# Patient Record
Sex: Female | Born: 1935 | Race: White | Hispanic: No | Smoking: Former smoker
Health system: Southern US, Community
[De-identification: ages and names within clinical notes are randomized; demographics above are authoritative.]

## PROBLEM LIST (undated history)

## (undated) DIAGNOSIS — I639 Cerebral infarction, unspecified: Secondary | ICD-10-CM

## (undated) DIAGNOSIS — I10 Essential (primary) hypertension: Secondary | ICD-10-CM

## (undated) DIAGNOSIS — C50912 Malignant neoplasm of unspecified site of left female breast: Secondary | ICD-10-CM

## (undated) DIAGNOSIS — G43909 Migraine, unspecified, not intractable, without status migrainosus: Secondary | ICD-10-CM

## (undated) DIAGNOSIS — Z8739 Personal history of other diseases of the musculoskeletal system and connective tissue: Secondary | ICD-10-CM

## (undated) DIAGNOSIS — H409 Unspecified glaucoma: Secondary | ICD-10-CM

## (undated) DIAGNOSIS — H3560 Retinal hemorrhage, unspecified eye: Secondary | ICD-10-CM

## (undated) HISTORY — PX: TUBAL LIGATION: SHX77

## (undated) HISTORY — PX: BREAST BIOPSY: SHX20

## (undated) HISTORY — PX: AXILLARY LYMPH NODE DISSECTION: SHX5229

## (undated) HISTORY — PX: APPENDECTOMY: SHX54

---

## 1999-05-10 DIAGNOSIS — I639 Cerebral infarction, unspecified: Secondary | ICD-10-CM

## 1999-05-10 HISTORY — DX: Cerebral infarction, unspecified: I63.9

## 1999-08-29 ENCOUNTER — Encounter: Payer: Self-pay | Admitting: Emergency Medicine

## 1999-08-29 ENCOUNTER — Encounter: Payer: Self-pay | Admitting: Neurology

## 1999-08-29 ENCOUNTER — Inpatient Hospital Stay (HOSPITAL_COMMUNITY): Admission: EM | Admit: 1999-08-29 | Discharge: 1999-09-01 | Payer: Self-pay | Admitting: Emergency Medicine

## 1999-08-30 ENCOUNTER — Encounter: Payer: Self-pay | Admitting: Neurology

## 1999-09-06 ENCOUNTER — Encounter: Admission: RE | Admit: 1999-09-06 | Discharge: 1999-09-15 | Payer: Self-pay | Admitting: Neurology

## 2016-05-18 DIAGNOSIS — R05 Cough: Secondary | ICD-10-CM | POA: Diagnosis not present

## 2016-05-18 DIAGNOSIS — I10 Essential (primary) hypertension: Secondary | ICD-10-CM | POA: Diagnosis not present

## 2016-05-18 DIAGNOSIS — M109 Gout, unspecified: Secondary | ICD-10-CM | POA: Diagnosis not present

## 2016-05-18 DIAGNOSIS — Z853 Personal history of malignant neoplasm of breast: Secondary | ICD-10-CM | POA: Diagnosis not present

## 2016-05-18 DIAGNOSIS — J40 Bronchitis, not specified as acute or chronic: Secondary | ICD-10-CM | POA: Diagnosis not present

## 2016-05-18 DIAGNOSIS — Z6826 Body mass index (BMI) 26.0-26.9, adult: Secondary | ICD-10-CM | POA: Diagnosis not present

## 2016-05-18 DIAGNOSIS — E785 Hyperlipidemia, unspecified: Secondary | ICD-10-CM | POA: Diagnosis not present

## 2016-05-18 DIAGNOSIS — Z9119 Patient's noncompliance with other medical treatment and regimen: Secondary | ICD-10-CM | POA: Diagnosis not present

## 2017-03-07 DIAGNOSIS — Z23 Encounter for immunization: Secondary | ICD-10-CM | POA: Diagnosis not present

## 2017-11-01 DIAGNOSIS — I10 Essential (primary) hypertension: Secondary | ICD-10-CM | POA: Diagnosis not present

## 2017-11-01 DIAGNOSIS — E785 Hyperlipidemia, unspecified: Secondary | ICD-10-CM | POA: Diagnosis not present

## 2017-11-01 DIAGNOSIS — C50912 Malignant neoplasm of unspecified site of left female breast: Secondary | ICD-10-CM | POA: Diagnosis not present

## 2017-11-01 DIAGNOSIS — F09 Unspecified mental disorder due to known physiological condition: Secondary | ICD-10-CM | POA: Diagnosis not present

## 2017-11-01 DIAGNOSIS — Z6825 Body mass index (BMI) 25.0-25.9, adult: Secondary | ICD-10-CM | POA: Diagnosis not present

## 2017-11-01 DIAGNOSIS — Z8673 Personal history of transient ischemic attack (TIA), and cerebral infarction without residual deficits: Secondary | ICD-10-CM | POA: Diagnosis not present

## 2017-11-21 DIAGNOSIS — H2513 Age-related nuclear cataract, bilateral: Secondary | ICD-10-CM | POA: Diagnosis not present

## 2017-11-21 DIAGNOSIS — H02032 Senile entropion of right lower eyelid: Secondary | ICD-10-CM | POA: Diagnosis not present

## 2017-12-21 DIAGNOSIS — H02032 Senile entropion of right lower eyelid: Secondary | ICD-10-CM | POA: Diagnosis not present

## 2017-12-22 DIAGNOSIS — D126 Benign neoplasm of colon, unspecified: Secondary | ICD-10-CM | POA: Diagnosis not present

## 2017-12-22 DIAGNOSIS — Z6825 Body mass index (BMI) 25.0-25.9, adult: Secondary | ICD-10-CM | POA: Diagnosis not present

## 2017-12-22 DIAGNOSIS — F09 Unspecified mental disorder due to known physiological condition: Secondary | ICD-10-CM | POA: Diagnosis not present

## 2017-12-22 DIAGNOSIS — N183 Chronic kidney disease, stage 3 (moderate): Secondary | ICD-10-CM | POA: Diagnosis not present

## 2017-12-22 DIAGNOSIS — Z853 Personal history of malignant neoplasm of breast: Secondary | ICD-10-CM | POA: Diagnosis not present

## 2017-12-22 DIAGNOSIS — Z8673 Personal history of transient ischemic attack (TIA), and cerebral infarction without residual deficits: Secondary | ICD-10-CM | POA: Diagnosis not present

## 2017-12-22 DIAGNOSIS — E785 Hyperlipidemia, unspecified: Secondary | ICD-10-CM | POA: Diagnosis not present

## 2017-12-22 DIAGNOSIS — I10 Essential (primary) hypertension: Secondary | ICD-10-CM | POA: Diagnosis not present

## 2017-12-22 DIAGNOSIS — M109 Gout, unspecified: Secondary | ICD-10-CM | POA: Diagnosis not present

## 2018-05-08 DIAGNOSIS — H34832 Tributary (branch) retinal vein occlusion, left eye, with macular edema: Secondary | ICD-10-CM | POA: Diagnosis not present

## 2018-05-09 HISTORY — PX: EYE SURGERY: SHX253

## 2018-05-18 DIAGNOSIS — H35031 Hypertensive retinopathy, right eye: Secondary | ICD-10-CM | POA: Diagnosis not present

## 2018-05-18 DIAGNOSIS — H35012 Changes in retinal vascular appearance, left eye: Secondary | ICD-10-CM | POA: Diagnosis not present

## 2018-05-18 DIAGNOSIS — H35032 Hypertensive retinopathy, left eye: Secondary | ICD-10-CM | POA: Diagnosis not present

## 2018-05-21 DIAGNOSIS — R29810 Facial weakness: Secondary | ICD-10-CM | POA: Diagnosis not present

## 2018-05-21 DIAGNOSIS — G319 Degenerative disease of nervous system, unspecified: Secondary | ICD-10-CM | POA: Diagnosis not present

## 2018-05-21 DIAGNOSIS — Z8673 Personal history of transient ischemic attack (TIA), and cerebral infarction without residual deficits: Secondary | ICD-10-CM | POA: Diagnosis not present

## 2018-05-21 DIAGNOSIS — I10 Essential (primary) hypertension: Secondary | ICD-10-CM | POA: Diagnosis not present

## 2018-05-21 DIAGNOSIS — I6782 Cerebral ischemia: Secondary | ICD-10-CM | POA: Diagnosis not present

## 2018-05-21 DIAGNOSIS — Z87891 Personal history of nicotine dependence: Secondary | ICD-10-CM | POA: Diagnosis not present

## 2018-05-21 DIAGNOSIS — Z79899 Other long term (current) drug therapy: Secondary | ICD-10-CM | POA: Diagnosis not present

## 2018-05-21 DIAGNOSIS — R29818 Other symptoms and signs involving the nervous system: Secondary | ICD-10-CM | POA: Diagnosis not present

## 2018-05-21 DIAGNOSIS — G9389 Other specified disorders of brain: Secondary | ICD-10-CM | POA: Diagnosis not present

## 2018-05-21 DIAGNOSIS — Z882 Allergy status to sulfonamides status: Secondary | ICD-10-CM | POA: Diagnosis not present

## 2018-05-21 DIAGNOSIS — R410 Disorientation, unspecified: Secondary | ICD-10-CM | POA: Diagnosis not present

## 2018-05-22 ENCOUNTER — Emergency Department (HOSPITAL_COMMUNITY): Payer: Medicare Other

## 2018-05-22 ENCOUNTER — Encounter (HOSPITAL_COMMUNITY): Payer: Self-pay | Admitting: Emergency Medicine

## 2018-05-22 ENCOUNTER — Inpatient Hospital Stay (HOSPITAL_COMMUNITY)
Admission: EM | Admit: 2018-05-22 | Discharge: 2018-05-25 | DRG: 077 | Disposition: A | Discharge status: Home Health | Payer: Medicare Other | Attending: Internal Medicine | Admitting: Internal Medicine

## 2018-05-22 ENCOUNTER — Other Ambulatory Visit: Payer: Self-pay

## 2018-05-22 ENCOUNTER — Inpatient Hospital Stay (HOSPITAL_COMMUNITY): Payer: Medicare Other

## 2018-05-22 DIAGNOSIS — R4702 Dysphasia: Secondary | ICD-10-CM | POA: Diagnosis not present

## 2018-05-22 DIAGNOSIS — N179 Acute kidney failure, unspecified: Secondary | ICD-10-CM | POA: Diagnosis present

## 2018-05-22 DIAGNOSIS — R471 Dysarthria and anarthria: Secondary | ICD-10-CM | POA: Diagnosis present

## 2018-05-22 DIAGNOSIS — I161 Hypertensive emergency: Secondary | ICD-10-CM | POA: Diagnosis not present

## 2018-05-22 DIAGNOSIS — I6330 Cerebral infarction due to thrombosis of unspecified cerebral artery: Secondary | ICD-10-CM

## 2018-05-22 DIAGNOSIS — R4182 Altered mental status, unspecified: Secondary | ICD-10-CM

## 2018-05-22 DIAGNOSIS — I129 Hypertensive chronic kidney disease with stage 1 through stage 4 chronic kidney disease, or unspecified chronic kidney disease: Secondary | ICD-10-CM | POA: Diagnosis present

## 2018-05-22 DIAGNOSIS — R509 Fever, unspecified: Secondary | ICD-10-CM | POA: Diagnosis not present

## 2018-05-22 DIAGNOSIS — Z9114 Patient's other noncompliance with medication regimen: Secondary | ICD-10-CM | POA: Diagnosis not present

## 2018-05-22 DIAGNOSIS — R278 Other lack of coordination: Secondary | ICD-10-CM | POA: Diagnosis present

## 2018-05-22 DIAGNOSIS — F0390 Unspecified dementia without behavioral disturbance: Secondary | ICD-10-CM | POA: Diagnosis present

## 2018-05-22 DIAGNOSIS — I639 Cerebral infarction, unspecified: Secondary | ICD-10-CM

## 2018-05-22 DIAGNOSIS — I674 Hypertensive encephalopathy: Secondary | ICD-10-CM | POA: Diagnosis present

## 2018-05-22 DIAGNOSIS — N183 Chronic kidney disease, stage 3 (moderate): Secondary | ICD-10-CM | POA: Diagnosis not present

## 2018-05-22 DIAGNOSIS — Z881 Allergy status to other antibiotic agents status: Secondary | ICD-10-CM

## 2018-05-22 DIAGNOSIS — R441 Visual hallucinations: Secondary | ICD-10-CM | POA: Diagnosis not present

## 2018-05-22 DIAGNOSIS — G9340 Encephalopathy, unspecified: Secondary | ICD-10-CM | POA: Diagnosis present

## 2018-05-22 DIAGNOSIS — R531 Weakness: Secondary | ICD-10-CM | POA: Diagnosis not present

## 2018-05-22 DIAGNOSIS — D72829 Elevated white blood cell count, unspecified: Secondary | ICD-10-CM | POA: Diagnosis present

## 2018-05-22 DIAGNOSIS — I6521 Occlusion and stenosis of right carotid artery: Secondary | ICD-10-CM | POA: Diagnosis not present

## 2018-05-22 DIAGNOSIS — I160 Hypertensive urgency: Secondary | ICD-10-CM | POA: Diagnosis not present

## 2018-05-22 DIAGNOSIS — R4701 Aphasia: Secondary | ICD-10-CM | POA: Diagnosis present

## 2018-05-22 DIAGNOSIS — R410 Disorientation, unspecified: Secondary | ICD-10-CM | POA: Diagnosis not present

## 2018-05-22 DIAGNOSIS — Z79899 Other long term (current) drug therapy: Secondary | ICD-10-CM

## 2018-05-22 DIAGNOSIS — Z8673 Personal history of transient ischemic attack (TIA), and cerebral infarction without residual deficits: Secondary | ICD-10-CM | POA: Diagnosis not present

## 2018-05-22 DIAGNOSIS — I6389 Other cerebral infarction: Secondary | ICD-10-CM | POA: Diagnosis not present

## 2018-05-22 DIAGNOSIS — Z882 Allergy status to sulfonamides status: Secondary | ICD-10-CM

## 2018-05-22 DIAGNOSIS — G459 Transient cerebral ischemic attack, unspecified: Secondary | ICD-10-CM | POA: Diagnosis not present

## 2018-05-22 DIAGNOSIS — I499 Cardiac arrhythmia, unspecified: Secondary | ICD-10-CM | POA: Diagnosis not present

## 2018-05-22 DIAGNOSIS — H409 Unspecified glaucoma: Secondary | ICD-10-CM | POA: Diagnosis present

## 2018-05-22 DIAGNOSIS — I10 Essential (primary) hypertension: Secondary | ICD-10-CM | POA: Diagnosis present

## 2018-05-22 DIAGNOSIS — R4020 Unspecified coma: Secondary | ICD-10-CM | POA: Diagnosis not present

## 2018-05-22 DIAGNOSIS — R413 Other amnesia: Secondary | ICD-10-CM | POA: Diagnosis not present

## 2018-05-22 DIAGNOSIS — R404 Transient alteration of awareness: Secondary | ICD-10-CM | POA: Diagnosis not present

## 2018-05-22 DIAGNOSIS — D696 Thrombocytopenia, unspecified: Secondary | ICD-10-CM | POA: Diagnosis present

## 2018-05-22 DIAGNOSIS — E785 Hyperlipidemia, unspecified: Secondary | ICD-10-CM | POA: Diagnosis not present

## 2018-05-22 DIAGNOSIS — I63532 Cerebral infarction due to unspecified occlusion or stenosis of left posterior cerebral artery: Secondary | ICD-10-CM | POA: Diagnosis not present

## 2018-05-22 DIAGNOSIS — Z87891 Personal history of nicotine dependence: Secondary | ICD-10-CM

## 2018-05-22 DIAGNOSIS — Z7982 Long term (current) use of aspirin: Secondary | ICD-10-CM | POA: Diagnosis not present

## 2018-05-22 DIAGNOSIS — F1010 Alcohol abuse, uncomplicated: Secondary | ICD-10-CM | POA: Diagnosis present

## 2018-05-22 HISTORY — DX: Unspecified glaucoma: H40.9

## 2018-05-22 HISTORY — DX: Cerebral infarction, unspecified: I63.9

## 2018-05-22 HISTORY — DX: Retinal hemorrhage, unspecified eye: H35.60

## 2018-05-22 HISTORY — DX: Migraine, unspecified, not intractable, without status migrainosus: G43.909

## 2018-05-22 HISTORY — DX: Malignant neoplasm of unspecified site of left female breast: C50.912

## 2018-05-22 HISTORY — DX: Essential (primary) hypertension: I10

## 2018-05-22 HISTORY — DX: Personal history of other diseases of the musculoskeletal system and connective tissue: Z87.39

## 2018-05-22 LAB — CBC WITH DIFFERENTIAL/PLATELET
Abs Immature Granulocytes: 0.05 10*3/uL (ref 0.00–0.07)
BASOS PCT: 1 %
Basophils Absolute: 0.1 10*3/uL (ref 0.0–0.1)
Eosinophils Absolute: 0.1 10*3/uL (ref 0.0–0.5)
Eosinophils Relative: 1 %
HCT: 38.1 % (ref 36.0–46.0)
Hemoglobin: 12.4 g/dL (ref 12.0–15.0)
Immature Granulocytes: 0 %
Lymphocytes Relative: 14 %
Lymphs Abs: 1.6 10*3/uL (ref 0.7–4.0)
MCH: 31.7 pg (ref 26.0–34.0)
MCHC: 32.5 g/dL (ref 30.0–36.0)
MCV: 97.4 fL (ref 80.0–100.0)
MONOS PCT: 9 %
Monocytes Absolute: 1.1 10*3/uL — ABNORMAL HIGH (ref 0.1–1.0)
Neutro Abs: 8.9 10*3/uL — ABNORMAL HIGH (ref 1.7–7.7)
Neutrophils Relative %: 75 %
Platelets: 102 10*3/uL — ABNORMAL LOW (ref 150–400)
RBC: 3.91 MIL/uL (ref 3.87–5.11)
RDW: 12.3 % (ref 11.5–15.5)
WBC: 11.8 10*3/uL — ABNORMAL HIGH (ref 4.0–10.5)
nRBC: 0 % (ref 0.0–0.2)

## 2018-05-22 LAB — URINALYSIS, ROUTINE W REFLEX MICROSCOPIC
BILIRUBIN URINE: NEGATIVE
Glucose, UA: NEGATIVE mg/dL
KETONES UR: NEGATIVE mg/dL
Nitrite: NEGATIVE
Protein, ur: 30 mg/dL — AB
Specific Gravity, Urine: 1.012 (ref 1.005–1.030)
pH: 6 (ref 5.0–8.0)

## 2018-05-22 LAB — INFLUENZA PANEL BY PCR (TYPE A & B)
Influenza A By PCR: NEGATIVE
Influenza B By PCR: NEGATIVE

## 2018-05-22 LAB — COMPREHENSIVE METABOLIC PANEL
ALT: 29 U/L (ref 0–44)
AST: 34 U/L (ref 15–41)
Albumin: 3.8 g/dL (ref 3.5–5.0)
Alkaline Phosphatase: 57 U/L (ref 38–126)
Anion gap: 12 (ref 5–15)
BUN: 35 mg/dL — ABNORMAL HIGH (ref 8–23)
CO2: 19 mmol/L — ABNORMAL LOW (ref 22–32)
Calcium: 9.6 mg/dL (ref 8.9–10.3)
Chloride: 110 mmol/L (ref 98–111)
Creatinine, Ser: 1.54 mg/dL — ABNORMAL HIGH (ref 0.44–1.00)
GFR calc Af Amer: 35 mL/min — ABNORMAL LOW (ref >60)
GFR calc non Af Amer: 30 mL/min — ABNORMAL LOW (ref >60)
Glucose, Bld: 112 mg/dL — ABNORMAL HIGH (ref 70–99)
Potassium: 5 mmol/L (ref 3.5–5.1)
Sodium: 141 mmol/L (ref 135–145)
TOTAL PROTEIN: 7 g/dL (ref 6.5–8.1)
Total Bilirubin: 1.3 mg/dL — ABNORMAL HIGH (ref 0.3–1.2)

## 2018-05-22 LAB — HEMOGLOBIN A1C
HEMOGLOBIN A1C: 6 % — AB (ref 4.8–5.6)
MEAN PLASMA GLUCOSE: 125.5 mg/dL

## 2018-05-22 LAB — AMMONIA: AMMONIA: 22 umol/L (ref 9–35)

## 2018-05-22 LAB — LIPID PANEL
Cholesterol: 198 mg/dL (ref 0–200)
HDL: 63 mg/dL (ref >40)
LDL Cholesterol: 116 mg/dL — ABNORMAL HIGH (ref 0–99)
Total CHOL/HDL Ratio: 3.1 RATIO
Triglycerides: 96 mg/dL (ref <150)
VLDL: 19 mg/dL (ref 0–40)

## 2018-05-22 LAB — TSH: TSH: 0.627 u[IU]/mL (ref 0.350–4.500)

## 2018-05-22 LAB — I-STAT TROPONIN, ED: Troponin i, poc: 0.02 ng/mL (ref 0.00–0.08)

## 2018-05-22 LAB — I-STAT CG4 LACTIC ACID, ED: Lactic Acid, Venous: 1.58 mmol/L (ref 0.5–1.9)

## 2018-05-22 MED ORDER — SODIUM CHLORIDE 0.9 % IV BOLUS (SEPSIS)
1000.0000 mL | Freq: Once | INTRAVENOUS | Status: AC
  Administered 2018-05-22: 1000 mL via INTRAVENOUS

## 2018-05-22 MED ORDER — ACETAMINOPHEN 325 MG PO TABS: 650.0000 mg | ORAL_TABLET | Freq: Four times a day (QID) | ORAL | Status: DC | PRN

## 2018-05-22 MED ORDER — ATORVASTATIN CALCIUM 80 MG PO TABS
80.0000 mg | ORAL_TABLET | Freq: Every day | ORAL | Status: DC
  Administered 2018-05-22: 80 mg via ORAL
  Filled 2018-05-22: qty 1

## 2018-05-22 MED ORDER — LOSARTAN POTASSIUM 50 MG PO TABS
100.00 | ORAL_TABLET | ORAL | Status: DC
Start: 2018-05-22 — End: 2018-05-22

## 2018-05-22 MED ORDER — AMLODIPINE BESYLATE 5 MG PO TABS
5.0000 mg | ORAL_TABLET | Freq: Every day | ORAL | Status: DC
  Administered 2018-05-22 – 2018-05-24 (×3): 5 mg via ORAL
  Filled 2018-05-22 (×3): qty 1

## 2018-05-22 MED ORDER — ENOXAPARIN SODIUM 30 MG/0.3ML ~~LOC~~ SOLN
30.0000 mg | SUBCUTANEOUS | Status: DC
  Administered 2018-05-23 – 2018-05-24 (×2): 30 mg via SUBCUTANEOUS
  Filled 2018-05-22 (×2): qty 0.3

## 2018-05-22 MED ORDER — ACETAMINOPHEN 500 MG PO TABS
1000.0000 mg | ORAL_TABLET | Freq: Once | ORAL | Status: AC
  Administered 2018-05-22: 1000 mg via ORAL
  Filled 2018-05-22: qty 2

## 2018-05-22 MED ORDER — ENOXAPARIN SODIUM 40 MG/0.4ML ~~LOC~~ SOLN
40.0000 mg | SUBCUTANEOUS | Status: DC
  Administered 2018-05-22: 40 mg via SUBCUTANEOUS
  Filled 2018-05-22: qty 0.4

## 2018-05-22 MED ORDER — ASPIRIN EC 81 MG PO TBEC
81.0000 mg | DELAYED_RELEASE_TABLET | Freq: Every day | ORAL | Status: DC
  Administered 2018-05-22 – 2018-05-25 (×4): 81 mg via ORAL
  Filled 2018-05-22 (×4): qty 1

## 2018-05-22 MED ORDER — SENNOSIDES-DOCUSATE SODIUM 8.6-50 MG PO TABS: 1.0000 | ORAL_TABLET | Freq: Every evening | ORAL | Status: DC | PRN

## 2018-05-22 MED ORDER — HYDRALAZINE HCL 20 MG/ML IJ SOLN
10.0000 mg | Freq: Once | INTRAMUSCULAR | Status: AC
  Administered 2018-05-22: 10 mg via INTRAVENOUS
  Filled 2018-05-22: qty 1

## 2018-05-22 MED ORDER — HYDRALAZINE HCL 20 MG/ML IJ SOLN
5.0000 mg | INTRAMUSCULAR | Status: DC | PRN
  Administered 2018-05-22 – 2018-05-23 (×3): 5 mg via INTRAVENOUS
  Filled 2018-05-22 (×3): qty 1

## 2018-05-22 MED ORDER — ACETAMINOPHEN 650 MG RE SUPP: 650.0000 mg | Freq: Four times a day (QID) | RECTAL | Status: DC | PRN

## 2018-05-22 NOTE — ED Notes (Signed)
Per Neurology pt is going to be on a cardene drip, will need new bed request.

## 2018-05-22 NOTE — ED Notes (Signed)
BIB EMS from home, per family pt has been altered X1 day. Was seen at [facility] yesterday where she had stroke workup which was negative. Family wants another opinion.

## 2018-05-22 NOTE — ED Notes (Signed)
This RN attempted to collect 2nd set of blood cultures but was unsuccessful, phlebotomy aware and will attempt.

## 2018-05-22 NOTE — Consult Note (Signed)
NAME:  Rhonda Sharp, MRN:  [id number], DOB:  [birth year], LOS: 0 ADMISSION DATE:  05/22/2018, CONSULTATION DATE:  05/22/18 REFERRING MD: Dr.  Heber Westover, CHIEF COMPLAINT:  Hypertensive emergency    Brief History   This is an 85 year old female with history of hypertension who presented with a episode yesterday with and right arm weakness, came in from [facility] emergency department where showed no acute stroke patient was noted to be hypertensive up to 409 systolic.  Started on a Cardene drip with goal blood pressure of less than 180.  PCCM consulted for admission for nicardipine drip.  History of present illness   This is an 85 year old female history of hypertension, alcohol use, and recurrent symptoms of slurred speech anemia lateral weakness who had presented to [facility] yesterday for similar symptoms.  MRI there showed age-related changes with no acute ischemic events and she was discharged and recommended to follow-up with PCP.  This morning the reports that she developed symptoms of slurred speech and right arm weakness.  She was found to be hypertensive up to 811 systolic.  While in the emergency department patient symptoms had significantly improved.  Neurology was consulted who Patient recently moved here from [place] to be closer to her family.  She has not been taking her blood pressure medications for past few days.  Is not have a primary care physician in the area yet. Patient denied any headaches, blurry vision, shortness of breath, chest pain, nausea or vomiting. No other complaints at this time. She reports that her slurred speech and weakness had improved.   In the ED patient was noted to be afebrile, RR 19, heart rate around 69, and hypertensive up to 220/97.  TSH was normal.  Influenza was negative.  Urinalysis showed trace leukocytes and 30 protein.  CMP showed a creatinine of 1.4, unknown baseline.  WBC showed a mild elevation in WBC to 11.8 with  A left shift.   Lactic was not elevated at 1.58.  I-STAT troponin was 0.02.  CT head showed age related findings, no acute findings.  Chest x-ray showed no acute findings. She was given 1L NS and 10 mg hydralazine. BP was down to 914 systolic when we evaluated.   Past Medical History   Past Medical History:  Diagnosis Date  . Glaucoma   . Hypertension   . Retinal hemorrhage      Significant Hospital Events     Consults:  PCCM Neurology  Procedures:    Significant Diagnostic Tests:  CT head >> no acute findings  Micro Data:  Blood culture >>  Urine culture >>  Antimicrobials:     Interim history/subjective:  Patient denied any new symptoms since admission, she reports that she has been feeling fine and has no complaints.  Pressure has been stable, she has only received 10 mg hydralazine this morning.  Objective   Blood pressure (!) 171/84, pulse 66, temperature 100 F (37.8 C), temperature source Rectal, resp. rate 20, SpO2 98 %.        Intake/Output Summary (Last 24 hours) at 05/22/2018 1528 Last data filed at 05/22/2018 0842 Gross per 24 hour  Intake 2333.33 ml  Output -  Net 2333.33 ml   There were no vitals filed for this visit.  Examination: General: Frail appearing, no acute distress HENT: Normocephalic, atraumatic Lungs: CTA, no wheezing, rhonchi or rales Cardiovascular: Irregular rhythm, normal rate Abdomen: Soft, non-tender, +BS Extremities: Trace bilateral LE edema Neuro: 5/5 strength in upper and lower extremities,  CN 2-12 intact, sensation to light touch intact  Resolved Hospital Problem list     Assessment & Plan:   Hypertensive emergency Acute encephalopathy, possibly 2/2 hypertensive encephalopathy -Patient is noted to be on losartan 10 and bystolic 10mg  at home, she reports that she hadn't been taking anything for the past 3-4 days. On evaluation patients blood pressure was around 329 systolic, she had received hydralazine 10 mg at 8:30 this morning and  her blood pressure has remained around 518-841 systolic. Discussed with Dr. Vaughan Browner, does not think that nicardipine drip is required at this time. Recommend step down unit admission, can re-consult if blood pressure increases is not responding to PO medications.  -Start amlodipine 5 mg daily, continue hydralazine 5 mg PRN, goal blood pressure between 160-180 -Neurology following,  -Follow-up on MRI brain, MRA head and neck -Aspirin 325 daily Atorvastatin 80 mg daily -A1c and lipid panel -Telemetry -SLP -F/u TSH -PT/OT  EtOH use:  -Continue CIWA protocol  AKI: Cr on admission was 1.54, unknown baseline. No known history of kidney disease.  -BMP in AM  Best practice:  Diet: Regular Pain/Anxiety/Delirium protocol (if indicated): N/A VAP protocol (if indicated): N/A DVT prophylaxis: Lovenox GI prophylaxis: None Glucose control: per primary Mobility: Bedbound Code Status: DNR Family Communication: Spoke with patient Disposition: Step down unit  Labs   CBC: Recent Labs  Lab 05/22/18 0617  WBC 11.8*  NEUTROABS 8.9*  HGB 12.4  HCT 38.1  MCV 97.4  PLT 102*    Basic Metabolic Panel: Recent Labs  Lab 05/22/18 0617  NA 141  K 5.0  CL 110  CO2 19*  GLUCOSE 112*  BUN 35*  CREATININE 1.54*  CALCIUM 9.6   GFR: CrCl cannot be calculated (Unknown ideal weight.). Recent Labs  Lab 05/22/18 0615 05/22/18 0617  WBC  --  11.8*  LATICACIDVEN 1.58  --     Liver Function Tests: Recent Labs  Lab 05/22/18 0617  AST 34  ALT 29  ALKPHOS 57  BILITOT 1.3*  PROT 7.0  ALBUMIN 3.8   No results for input(s): LIPASE, AMYLASE in the last 168 hours. No results for input(s): AMMONIA in the last 168 hours.  ABG No results found for: PHART, PCO2ART, PO2ART, HCO3, TCO2, ACIDBASEDEF, O2SAT   Coagulation Profile: No results for input(s): INR, PROTIME in the last 168 hours.  Cardiac Enzymes: No results for input(s): CKTOTAL, CKMB, CKMBINDEX, TROPONINI in the last 168  hours.  HbA1C: Hgb A1c MFr Bld  Date/Time Value Ref Range Status  05/22/2018 03:17 AM 6.0 (H) 4.8 - 5.6 % Final    Comment:    (NOTE) Pre diabetes:          5.7%-6.4% Diabetes:              >6.4% Glycemic control for   <7.0% adults with diabetes     CBG: No results for input(s): GLUCAP in the last 168 hours.  Review of Systems:   Negative except per HPI.   Past Medical History  She,  has a past medical history of Glaucoma, Hypertension, and Retinal hemorrhage.   Surgical History   History reviewed. No pertinent surgical history.   Social History   reports that she has quit smoking. She does not have any smokeless tobacco history on file. She reports current alcohol use.   Family History   Her family history includes ALS in her sister.   Allergies Allergies  Allergen Reactions  . Sulfa Antibiotics      Home  Medications  Prior to Admission medications   Medication Sig Start Date End Date Taking? Authorizing Provider  aspirin 81 MG tablet Take 81 mg by mouth daily.   Yes [provider]  losartan (COZAAR) 100 MG tablet Take 100 mg by mouth daily.   Yes [provider]  nebivolol (BYSTOLIC) 10 MG tablet Take 10 mg by mouth daily.   Yes [provider]     Critical care time: 30 min   Asencion Noble, M.D. PGY1 Pager [phone] 05/22/2018 4:34 PM

## 2018-05-22 NOTE — H&P (Addendum)
Date: 05/22/2018               Patient Name:  Rhonda Sharp MRN: [id number]  DOB: Jul 21, 1928 Age / Sex: 85 y.o., female   PCP: Patient, No Pcp Per         Medical Service: Internal Medicine Teaching Service         Attending Physician: Dr. Lucious Groves, DO    First Contact: Dr. Koleen Distance Pager: [phone]  Second Contact: Dr. Frederico Hamman Pager: [phone]       After Hours (After 5p/  First Contact Pager: [phone]  weekends / holidays): Second Contact Pager: [phone]   Chief Complaint: non-sensible speech  History of Present Illness: Rhonda Sharp is an 85 year old female with past medical history of hypertension that presents to the [facility] ED for waxing and waning of her mental status. She is accompanied by daughter that helps with history.  Per daughter patient was doing well yesterday when suddenly she developed slurred speech that did not resolve and they transferred patient to [place] [facility] [place].  There she was evaluated for her symptoms and acute stroke was ruled out.  She was ultimately discharged from the ED at night. Per daughter this morning patient was doing well and again suddenly developed slurred speech and right arm weakness.  Patient is unable to recall this event. She again was brought in by daughter to [facility] ED for further evaluation.  Currently patient is AxO x3.  She denies any pain. She states she feels fine. She denies fever/chills, nausea/vomiting, shortness of breath, chest pain,cough, myalgias, weakness, numbness, abdominal pain, or leg swelling.  She does not know why she is in the ED.  Patient discontinued her medications 2 years ago due to personal preference. She states that the medications were for hypertension. 6 months ago she restarted two blood pressure medications but It is unclear if she is taking them.  Patient moved from [place] to live with her daughter in December 2019. She had been living alone for the past  20 years independently and decided it was time to live with someone. Her daughter and patient denies anything acutely has changed that requires patient to have additional help.  Since being in [place] daughter noticed one episode of slurred speech that resolved spontaneously. She also states that about 8 weeks ago over the phone she noticed the patient had slurred speech and dropped the phone. She states again this resolved quickly. She also reports one week ago she had seen the ophthalmologist due to worsening vision and the ophthalmologist noted a bleeding vessel in the back of her eye. This was lasered.    Meds:  Current Meds  Medication Sig  . aspirin 81 MG tablet Take 81 mg by mouth daily.  Marland Kitchen losartan (COZAAR) 100 MG tablet Take 100 mg by mouth daily.  . nebivolol (BYSTOLIC) 10 MG tablet Take 10 mg by mouth daily.     Allergies: Allergies as of 05/22/2018 - Review Complete 05/22/2018  Allergen Reaction Noted  . Sulfa antibiotics  05/22/2018   Past Medical History:  Diagnosis Date  . Hypertension     Family History: Diabetes:  Mother and Father  Social History: tobacco use: 20-pack-year smoking history. Quit in 4431V Illicit drug use denies Alcohol use: Per daughter patient drinks vodka heavily during the week. However since being in [place] for the past month has not been able to consume her usual amount due to restriction by daughter  Review of Systems: A complete ROS was negative except as per HPI.   Physical Exam: Blood pressure (!) 148/106, pulse (!) 55, temperature 100 F (37.8 C), temperature source Rectal, resp. rate 20, SpO2 96 %. Physical Exam  Constitutional: She is oriented to person, place, and time and well-developed, well-nourished, and in no distress.  Cardiovascular: Normal rate, regular rhythm and normal heart sounds. Exam reveals no gallop and no friction rub.  No murmur heard. Pulmonary/Chest: Effort normal and breath sounds normal. No respiratory  distress. She has no wheezes. She has no rales.  Abdominal: Soft. Bowel sounds are normal. She exhibits no distension and no mass. There is no abdominal tenderness. There is no rebound and no guarding.  Neurological: She is alert and oriented to person, place, and time. No cranial nerve deficit.  5/5 motor strength in upper extremities bilaterally 4/5 motor strength in right lower extremity 5/5 motor strength in left lower extremity  Skin: Skin is warm and dry. No erythema.    EKG: personally reviewed my interpretation is ectopic atrial rhythm, no ischemic changes  CXR: personally reviewed my interpretation is atelectatic changes at the bases. No pulmonary edema or pleural effusions noted  Assessment & Plan by Problem: Active Problems:   Altered mental status  Altered mental status On admission patient had returned to baseline mental status per daughter.  She was alert and able to converse with a clear thought process.  Her blood pressure on admission was 220/97 And was 179/80 when being interviewed.  Patient had a CT head and MR brain yesterday when she had symptoms that did not show any acute explanation.  Today patient had a repeat CT head with no acute intracranial findings.  With the lowering of blood pressure and improvement in symptoms hypertensive encephalopathy is on the differentila.  At this time goal blood pressure will be to reduce initial blood pressure to <180/100.  Patient received hydralazine in the ED and blood pressure is currently 148/106.  Will need to monitor closely and will currently avoid extra antihypertensives at this time.  Due to symptoms of slurred speech and right upper extremity weakness patient may be experiencing TIAs. ABCD score placed patient in high risk for stroke.  Will consult neurology  Patient does have a history of heavy alcohol use and has reduced intake significantly since moving in with her daughter. Patient does not appear to be in withdrawal but  will place on CIWA. Patient has discontinued her recommended medications 2 years ago due to personal preference. It is unclear what patient was taking prior. Will obtain TSH Patient does have a mild leukocytosis with an elevated rectal temperature of 100. Chest x-ray and urine without signs of infection. Currently patient afebrile. Blood cultures obtained in the ED. At this time no signs to start antibiotics. -Blood pressure goal , 180/90 - consult neurology  -aspirin -CIWA - TSH -follow up blood cultures    Hypertension -Holding oral home blood pressure medications. These will likely need to be restarted.   Dispo: Admit patient to Inpatient with expected length of stay greater than 2 midnights.  Signed: Valinda Party, DO 05/22/2018, 9:17 AM  Pager: [phone]

## 2018-05-22 NOTE — Progress Notes (Addendum)
Discussed case with Dr. Cheral Marker with neurology who recommends nicardipine drip for blood pressure control. Discussed transfer to ICU with Dr. Vaughan Browner for management who accepts patient.

## 2018-05-22 NOTE — Consult Note (Addendum)
Neurology Consultation  Reason for Consult: Transient intermittent expressive aphasia/right arm weakness/hypertension Referring Physician: Dr. Heber Lovingston  History is obtained from: Daughter  HPI: Rhonda Sharp is a 85 y.o. female hypertension and possibly neurocognitive decline.  Per daughter patient was noted to have onset of expressive aphasia along with some right arm weakness which lasted for about 20 minutes. The aphasia then dissipated, but the right arm weakness did not resolve.  Patient was brought to [facility] emergency department where they did obtain MRI which did not show any acute stroke.  While at [facility] ED it was noted that she did have hypertension at 174/77.  Overnight daughter noted that the symptoms still waxed and waned, especially her expressive aphasia.  This morning it appeared that her expressive aphasia worsened; thus the daughter became worried and brought her mother to [facility], ED.  While here, she has had fluctuating blood pressures but more on the hypertensive urgency side ranging from 220/97 to 142/55 with last blood pressure being 187/93.  Daughter did make comment that prior to coming to [place] this past December to have further care with family she did stop taking her medications as her other daughter was diagnosed with ALS and she became depressed.  Prior to coming here, she did live in a two-level house, did not drive and has never driven, took care of herself by cooking, cleaning, doing laundry and all ADLs needed. Per daughter that there are some things that are making her slightly worried that her mom is getting some neurocognitive decline.  She did note that in her old house the neighbor found out that she had called the cops as she thought somebody was in her house and became paranoid when nobody was actually there.  ROS:  Unable to obtain due to altered mental status.   Past Medical History:  Diagnosis Date  . Glaucoma   . Hypertension   . Retinal  hemorrhage      Family History  Problem Relation Age of Onset  . ALS Sister    Social History:   reports that she has quit smoking. She does not have any smokeless tobacco history on file. She reports current alcohol use. No history on file for drug.  Medications  Current Facility-Administered Medications:  .  acetaminophen (TYLENOL) tablet 650 mg, 650 mg, Oral, Q6H PRN **OR** acetaminophen (TYLENOL) suppository 650 mg, 650 mg, Rectal, Q6H PRN, Hoffman, Jessica Ratliff, DO .  aspirin EC tablet 81 mg, 81 mg, Oral, Daily, Hoffman, Jessica Ratliff, DO .  enoxaparin (LOVENOX) injection 40 mg, 40 mg, Subcutaneous, Q24H, Hoffman, Jessica Ratliff, DO .  senna-docusate (Senokot-S) tablet 1 tablet, 1 tablet, Oral, QHS PRN, Valinda Party, DO  Current Outpatient Medications:  .  aspirin 81 MG tablet, Take 81 mg by mouth daily., Disp: , Rfl:  .  losartan (COZAAR) 100 MG tablet, Take 100 mg by mouth daily., Disp: , Rfl:  .  nebivolol (BYSTOLIC) 10 MG tablet, Take 10 mg by mouth daily., Disp: , Rfl:    Exam: Current vital signs: BP (!) 171/84   Pulse 66   Temp 100 F (37.8 C) (Rectal)   Resp 20   SpO2 98%  Vital signs in last 24 hours: Temp:  [100 F (37.8 C)] 100 F (37.8 C) (01/14 0601) Pulse Rate:  [55-70] 66 (01/14 1430) Resp:  [12-21] 20 (01/14 1430) BP: (117-220)/(48-106) 171/84 (01/14 1430) SpO2:  [96 %-98 %] 98 % (01/14 1430)  Physical Exam  Constitutional: Appears well-developed and  well-nourished.  Eyes: No scleral injection HENT: No OP obstrucion Head: Normocephalic.  Cardiovascular: Normal rate and regular rhythm.  Respiratory: Effort normal, non-labored breathing GI: Soft.  No distension. There is no tenderness.  Skin: WDI  Neuro: Mental Status: Patient is awake, alert to state that she is in a hospital but does not know which hospital she is in.  Oriented to [facility] and [place].  Patient is unable to give me a clear and coherent history.  She  states that she felt dizzy and that is why she is at the hospital, which is not the correct reason. Speech is fluent. Patient is able to follow simple motor commands, name objects and repeat but cannot follow a directional 3-step command, becoming rapidly confused.  Cranial Nerves: II: Visual fields are full.  III,IV, VI: EOMI without ptosis or diplopia. Pupils are equal, round, and reactive to light.   V: Facial sensation is symmetric to temperature VII: Facial movement is symmetric.  VIII: hearing is intact to voice X: Uvula elevates symmetrically XI: Shoulder shrug is symmetric. XII: tongue is midline without atrophy or fasciculations.  Motor: 4/5 throughout. No asymmetry.  Sensory: Sensation is symmetric to light touch and temperature in the arms and legs. Deep Tendon Reflexes: 2+ and symmetric in the biceps and patellae.  Plantars: Toes are downgoing bilaterally.  Cerebellar: FNF within normal limits bilaterally.  She did have dysmetria with right H-S but left H-S was normal    Labs I have reviewed labs in epic and the results pertinent to this consultation are: Urinalysis pending  CBC    Component Value Date/Time   WBC 11.8 (H) 05/22/2018 0617   RBC 3.91 05/22/2018 0617   HGB 12.4 05/22/2018 0617   HCT 38.1 05/22/2018 0617   PLT 102 (L) 05/22/2018 0617   MCV 97.4 05/22/2018 0617   MCH 31.7 05/22/2018 0617   MCHC 32.5 05/22/2018 0617   RDW 12.3 05/22/2018 0617   LYMPHSABS 1.6 05/22/2018 0617   MONOABS 1.1 (H) 05/22/2018 0617   EOSABS 0.1 05/22/2018 0617   BASOSABS 0.1 05/22/2018 0617    CMP     Component Value Date/Time   NA 141 05/22/2018 0617   K 5.0 05/22/2018 0617   CL 110 05/22/2018 0617   CO2 19 (L) 05/22/2018 0617   GLUCOSE 112 (H) 05/22/2018 0617   BUN 35 (H) 05/22/2018 0617   CREATININE 1.54 (H) 05/22/2018 0617   CALCIUM 9.6 05/22/2018 0617   PROT 7.0 05/22/2018 0617   ALBUMIN 3.8 05/22/2018 0617   AST 34 05/22/2018 0617   ALT 29 05/22/2018 0617    ALKPHOS 57 05/22/2018 0617   BILITOT 1.3 (H) 05/22/2018 0617   GFRNONAA 30 (L) 05/22/2018 0617   GFRAA 35 (L) 05/22/2018 0617    Lipid Panel  No results found for: CHOL, TRIG, HDL, CHOLHDL, VLDL, LDLCALC, LDLDIRECT   Imaging I have reviewed the images obtained:  CT-scan of the brain-Age congruent senescent changes without acute finding.  MRI examination of the brain-pending  Etta Quill PA-C [facility] [phone] 05/22/2018, 3:15 PM     Assessment:  85 year old female with possible neurocognitive decline re-presenting to the hospital secondary to intermittent expressive aphasia  1. MRI from her visit to [place] yesterday was negative for acute stroke per Care Everywhere  2. Due to worsening today the patient was brought back to the hospital.   3. Exam does not reveal a classic receptive or expressive aphasia. TIAs are possible but felt to be unlikely. Her  speech is fluent with intact comprehension for simple questions and commands. Most likely she has an undiagnosed underlying dementia, with acute worsening secondary to hypertensive encephalopathy.   Recommendations: # Nicardipine drip for blood pressure-this been has discussed with primary team- goal systolic blood pressure is < 180   # Repeat MRI of the brain without contrast # MRA Head  # Carotid ultrasound  # TTE   # Start patient on ASA 325 mg daily,  # Given advanced age, benefits of statin most likely outweighed by risks # SBP goal of < 180. This differs from standard permissive HTN protocol for possible stroke as her presentation is felt to be more likely due to hypertensive encephalopathy # Telemetry monitoring # Frequent neuro checks # NPO until passes stroke swallow screen # please page stroke NP  Or  PA  Or MD from 8am -4 pm  as this patient from this time will be  followed by the stroke.   You can look them up on [email or web address]  Password [email or web address]  I have interviewed and examined the patient. I have  formulated the assessment and plan.  Electronically signed: Dr. Kerney Elbe

## 2018-05-22 NOTE — ED Notes (Signed)
Report called, neurology team at bedside and requesting this RN wait until attending assesses pt before transporting upstairs.

## 2018-05-22 NOTE — ED Notes (Signed)
Patient transported to CT 

## 2018-05-22 NOTE — ED Provider Notes (Signed)
TIME SEEN: 6:08 AM  CHIEF COMPLAINT: Altered mental status  HPI: Patient is an 85 year old female with history of hypertension who presents to the emergency department with EMS with altered mental status.  History is provided by EMS.  Per family, patient has been altered since yesterday.  Was seen at [place] and had labs, urine, head CT, MRI done which were reportedly unremarkable.  Was discharged home with diagnosis of delirium.  Family sent her here for "second opinion".  ROS: Level 5 caveat for altered mental status  PAST MEDICAL HISTORY/PAST SURGICAL HISTORY:  Past Medical History:  Diagnosis Date  . Hypertension     MEDICATIONS:  Prior to Admission medications   Not on File    ALLERGIES:  Allergies  Allergen Reactions  . Sulfa Antibiotics     SOCIAL HISTORY:  Social History   Tobacco Use  . Smoking status: Not on file  Substance Use Topics  . Alcohol use: Not on file    FAMILY HISTORY: No family history on file.  EXAM: BP (!) 220/97 (BP Location: Right Arm)   Pulse 68   Temp 100 F (37.8 C) (Rectal)   Resp 12   SpO2 97%  CONSTITUTIONAL: Elderly.  Patient answers "no" to every question.  In no distress.  Rectal temp 100.  Patient is hypertensive. HEAD: Normocephalic, atraumatic EYES: Conjunctivae clear, pupils appear equal, EOMI ENT: normal nose; dry mucous membranes NECK: Supple, no meningismus, no nuchal rigidity, no LAD  CARD: RRR; S1 and S2 appreciated; no murmurs, no clicks, no rubs, no gallops RESP: Normal chest excursion without splinting or tachypnea; breath sounds clear and equal bilaterally; no wheezes, no rhonchi, no rales, no hypoxia or respiratory distress, speaking full sentences ABD/GI: Normal bowel sounds; non-distended; soft, non-tender, no rebound, no guarding, no peritoneal signs, no hepatosplenomegaly BACK:  The back appears normal and is non-tender to palpation, there is no CVA tenderness EXT: Normal ROM in all joints; non-tender to  palpation; no edema; normal capillary refill; no cyanosis, no calf tenderness or swelling    SKIN: Normal color for age and race; warm; no rash NEURO: Moves all extremities equally, follows commands, no dysarthria noted, patient answers no to every question, patient does not oriented, unable to test sensation appropriately, cranial nerves II through XII intact   MEDICAL DECISION MAKING: Patient here with altered mental status.  Found to be hypertensive and febrile.  Otherwise vital signs within normal limits.  No signs of trauma on examination.  She seems to follow commands but does not answer questions appropriately.  Unclear when last seen normal.  No family at bedside currently.  Will obtain labs, cultures, urine, chest x-ray, flu swab.  Will give IV fluids, Tylenol.  Will attempt to obtain records from [place] given she just had a head CT and MRI of her brain yesterday.  ED PROGRESS: Patient's daughter at bedside.  She reports that patient was living on her own in [place] until December.  She reports patient recently moved in with her but is still very independent and can carry on a conversation and care for herself.  2 days ago she became altered, talking "gibberish".  Was seen at [facility] ED in [place] and had a work-up which they report was unremarkable and discharged home.  She felt patient was not safe to be at home given her level of confusion.  Patient able to talk more now but has slightly dysarthric speech which is likely from dry mucous membranes.  She is hypertensive  here.  Patient cries inappropriately.  Patient denies any pain at this time.  When asked her what her name is she states "nobody told me what it was".   Patient's work-up here has been relatively unremarkable.  She does have mildly elevated creatinine.  She is receiving IV fluids.  No baseline for comparison.  Mild leukocytosis with left shift.  Lactate normal.  Chest x-ray clear.  Urine shows 11-20 white blood cells and rare  bacteria.  Urine culture, blood cultures pending.  Flu swab has been sent.  Patient continues to be hypertensive.  Will give IV hydralazine as hypertensive encephalopathy is also on the differential.  We have not yet received records from [place].  Will repeat head CT here but daughter reports head CT was recently normal.  Will admit for altered mental status.  She does not have a local primary care physician.  She denies any headache.  Has no meningismus on exam.   No changes in medications.  Patient has no drug or alcohol abuse history.  No known history of any trauma per family.    We are attempting to get records from [facility] emergency department.   7:52 AM Discussed patient's case with IM resident.  I have recommended admission and patient (and family if present) agree with this plan. Admitting physician will place admission orders.   I reviewed all nursing notes, vitals, pertinent previous records, EKGs, lab and urine results, imaging (as available).   I have reviewed patient's head CT and do not see any acute abnormality.   EKG Interpretation  Date/Time:  Tuesday May 22 2018 06:03:27 EST Ventricular Rate:  66 PR Interval:    QRS Duration: 80 QT Interval:  468 QTC Calculation: 491 R Axis:   61 Text Interpretation:  Sinus or ectopic atrial rhythm Probable LVH with secondary repol abnrm Borderline prolonged QT interval No significant change since last tracing Confirmed by Pryor Curia [phone]) on 05/22/2018 6:46:15 AM        CRITICAL CARE Performed by: Cyril Mourning Ward   Total critical care time: 55 minutes  Critical care time was exclusive of separately billable procedures and treating other patients.  Critical care was necessary to treat or prevent imminent or life-threatening deterioration.  Critical care was time spent personally by me on the following activities: development of treatment plan with patient and/or surrogate as well as nursing, discussions with  consultants, evaluation of patient's response to treatment, examination of patient, obtaining history from patient or surrogate, ordering and performing treatments and interventions, ordering and review of laboratory studies, ordering and review of radiographic studies, pulse oximetry and re-evaluation of patient's condition.    Ward, Delice Bison, DO 05/22/18 [id number]

## 2018-05-22 NOTE — ED Notes (Signed)
Pt given turkey sandwich and cranberry juice. 

## 2018-05-23 ENCOUNTER — Inpatient Hospital Stay (HOSPITAL_COMMUNITY): Payer: Medicare Other

## 2018-05-23 DIAGNOSIS — E785 Hyperlipidemia, unspecified: Secondary | ICD-10-CM

## 2018-05-23 DIAGNOSIS — I6330 Cerebral infarction due to thrombosis of unspecified cerebral artery: Secondary | ICD-10-CM

## 2018-05-23 DIAGNOSIS — I639 Cerebral infarction, unspecified: Secondary | ICD-10-CM

## 2018-05-23 LAB — BASIC METABOLIC PANEL
Anion gap: 11 (ref 5–15)
BUN: 32 mg/dL — ABNORMAL HIGH (ref 8–23)
CO2: 20 mmol/L — ABNORMAL LOW (ref 22–32)
Calcium: 9.1 mg/dL (ref 8.9–10.3)
Chloride: 110 mmol/L (ref 98–111)
Creatinine, Ser: 1.43 mg/dL — ABNORMAL HIGH (ref 0.44–1.00)
GFR calc Af Amer: 38 mL/min — ABNORMAL LOW (ref >60)
GFR calc non Af Amer: 33 mL/min — ABNORMAL LOW (ref >60)
Glucose, Bld: 114 mg/dL — ABNORMAL HIGH (ref 70–99)
Potassium: 4.2 mmol/L (ref 3.5–5.1)
Sodium: 141 mmol/L (ref 135–145)

## 2018-05-23 LAB — CBC
HCT: 36.4 % (ref 36.0–46.0)
Hemoglobin: 12 g/dL (ref 12.0–15.0)
MCH: 31.8 pg (ref 26.0–34.0)
MCHC: 33 g/dL (ref 30.0–36.0)
MCV: 96.6 fL (ref 80.0–100.0)
Platelets: 122 10*3/uL — ABNORMAL LOW (ref 150–400)
RBC: 3.77 MIL/uL — ABNORMAL LOW (ref 3.87–5.11)
RDW: 12.5 % (ref 11.5–15.5)
WBC: 16.7 10*3/uL — ABNORMAL HIGH (ref 4.0–10.5)
nRBC: 0 % (ref 0.0–0.2)

## 2018-05-23 LAB — URINE CULTURE: Culture: NO GROWTH

## 2018-05-23 MED ORDER — LABETALOL HCL 5 MG/ML IV SOLN
5.0000 mg | INTRAVENOUS | Status: DC | PRN
  Administered 2018-05-24 (×2): 5 mg via INTRAVENOUS
  Filled 2018-05-23 (×2): qty 4

## 2018-05-23 MED ORDER — NEBIVOLOL HCL 5 MG PO TABS
10.0000 mg | ORAL_TABLET | Freq: Every day | ORAL | Status: DC
  Administered 2018-05-23 – 2018-05-25 (×3): 10 mg via ORAL
  Filled 2018-05-23 (×3): qty 2

## 2018-05-23 MED ORDER — PRAVASTATIN SODIUM 10 MG PO TABS
20.0000 mg | ORAL_TABLET | Freq: Every day | ORAL | Status: DC
  Administered 2018-05-23 – 2018-05-24 (×2): 20 mg via ORAL
  Filled 2018-05-23 (×2): qty 2

## 2018-05-23 NOTE — Clinical Social Work Note (Signed)
Clinical Social Work Assessment  Patient Details  Name: Rhonda Sharp MRN: [id number] Date of Birth: February 25, 1929  Date of referral:  05/23/18               Reason for consult:  Facility Placement, Discharge Planning                Permission sought to share information with:  Family Supports Permission granted to share information::  No(patient not oriented)  Name::     Claiborne Billings  Agency::     Relationship::  Daughter     Contact Information:  [phone]   Housing/Transportation Living arrangements for the past 2 months:  Single Family Home Source of Information:  Adult Children Patient Interpreter Needed:  None Criminal Activity/Legal Involvement Pertinent to Current Situation/Hospitalization:  No - Comment as needed Significant Relationships:  Adult Children, Other Family Members Lives with:  Adult Children Do you feel safe going back to the place where you live?  Yes Need for family participation in patient care:  Yes (Comment)  Care giving concerns: Patient from home. PT recommending SNF vs home with 24 hour supervision/care.   Social Worker assessment / plan: CSW spoke to patient's daughter [name] on the phone. Introduced self and role and discussed disposition planning - PT recommendation for SNF.   [name] reported that she does not work and that her own daughter is also home during the day and can assist with caring for patient. They would prefer to bring patient home and provide 24 hour care for her there. [name] concerned that if patient goes to a SNF, her mental status will continue to decline; stated that she hasn't seen her mother "like this" before and is hopeful taking patient home will help her return to baseline. [name] is open to home health care.  Referred to [facility] for home health care needs. CSW will sign off for now, but please re-consult if disposition plan changes.  Employment status:  Retired Forensic scientist:  [employer] PT Recommendations:   [place] / Referral to community resources:  [place]  Patient/Family's Response to care: Daughter appreciative of care.  Patient/Family's Understanding of and Emotional Response to Diagnosis, Current Treatment, and Prognosis: Daughter with good understanding of patient's condition and care needs. Daughter prefers for patient to return home.   Emotional Assessment Appearance:  Appears stated age Attitude/Demeanor/Rapport:  Unable to Assess Affect (typically observed):  Unable to Assess Orientation:  Oriented to Self, Oriented to Place Alcohol / Substance use:  Not Applicable Psych involvement (Current and /or in the community):  No (Comment)  Discharge Needs  Concerns to be addressed:  Discharge Planning Concerns Readmission within the last 30 days:  No Current discharge risk:  Physical Impairment, Cognitively Impaired Barriers to Discharge:  Continued Medical Work up   Estanislado Emms, LCSW 05/23/2018, 12:17 PM

## 2018-05-23 NOTE — Progress Notes (Signed)
Physical Therapy Treatment Patient Details Name: Rhonda Sharp MRN: [id number] DOB: Aug 25, 1928 Today's Date: 05/23/2018    History of Present Illness Pt is an 85 y.o. female admitted 05/22/17 with AMS and hypertensive urgency. CXR without acute findings. Head CT negative for acute findings. Per neurology, pt likely has an undiagnosed underlying dementia, with acute worsening secondary to hypertensive encephalopathy. PMH includes HTN, glaucoma.   PT Comments    Pt seen, was standing in the room with chair alarm going off and heart monitor cords pulling. Pt required max encouragement to return to sitting (refused bed). Pt confused regarding current situation and PT/RN's request that she remain seated; increased time spent explaining this, but pt still confused. Spoke with daughter [name]) on phone to explain situation and also discuss pt's baseline PLOF/cognition. Daughter reports pt's cognition WFL and indep with mobility at home. Daughter unsure if pt would be agreeable to SNF; daughter available for 24/7 support at home. Pt at high risk for falls due to instability and decreased awareness. RN to provide activity board.   Follow Up Recommendations  SNF;Home health PT;Supervision/Assistance - 24 hour(daughter is able to provide 24/7)     Equipment Recommendations  Rolling walker with 5" wheels    Recommendations for Other Services       Precautions / Restrictions Precautions Precautions: Fall Restrictions Weight Bearing Restrictions: No    Mobility  Bed Mobility Overal bed mobility: Modified Independent             General bed mobility comments: Received standing in room with chair alarm beeping  Transfers Overall transfer level: Needs assistance Equipment used: None Transfers: Sit to/from Stand Sit to Stand: Min guard         General transfer comment: Reliant on UE support to steady upon standing  Ambulation/Gait Ambulation/Gait assistance: Min guard Gait  Distance (Feet): 10 Feet Assistive device: None Gait Pattern/deviations: Step-to pattern;Narrow base of support;Trunk flexed Gait velocity: Decreased Gait velocity interpretation: <1.8 ft/sec, indicate of risk for recurrent falls General Gait Details: Pt reaching for UE support on furniture to maintain balance; min guard for safety and assist to untangle lines. Max encouragement to return to sitting versus staying standing in middle of room   Stairs             Wheelchair Mobility    Modified Rankin (Stroke Patients Only) Modified Rankin (Stroke Patients Only) Pre-Morbid Rankin Score: No symptoms Modified Rankin: Moderately severe disability     Balance Overall balance assessment: Needs assistance   Sitting balance-Leahy Scale: Good       Standing balance-Leahy Scale: Fair Standing balance comment: Can static stand and take steps without UE support; min guard as pt unstable                            Cognition Arousal/Alertness: Awake/alert Behavior During Therapy: Flat affect Overall Cognitive Status: Impaired/Different from baseline Area of Impairment: Orientation;Attention;Following commands;Safety/judgement;Awareness;Problem solving                 Orientation Level: Disoriented to;Situation Current Attention Level: Selective   Following Commands: Follows one step commands with increased time;Follows one step commands inconsistently Safety/Judgement: Decreased awareness of safety;Decreased awareness of deficits Awareness: Emergent Problem Solving: Slow processing;Decreased initiation;Requires verbal cues General Comments: Spoke with daughter on phone who reports no significant baseline cognitive deficits ("she acts like a normal 85 year old"), as pt is indep at home. Pt standing in room with chair alarm beeping  and heart monitor pulled to end of cord on pt; refusing return to bed, would only sit in recliner, but reports she will keep getting up.  Extended time spent explaining fall risk and reason it is unsafe for pt to get up alone with cords attached to her; pt does not seem to fully grasp this as she should be able to do what she wants to do      Exercises      General Comments General comments (skin integrity, edema, etc.): Spoke with daughter [name]) on phone regarding pt's current confusion; [name] to come into hospital to provide support; she reports pt indep and baseline cognition WFL at home. Daughter reports she would be able to provide 24/7 support at home      Pertinent Vitals/Pain Pain Assessment: No/denies pain    Home Living Family/patient expects to be discharged to:: Private residence Living Arrangements: Children;Other relatives Available Help at Discharge: Family;Available 24 hours/day Type of Home: House       Home Equipment: Kasandra Knudsen - single point Additional Comments: Pt recently moved from [place] to live with daughter. Daughter reports she is available for 24/7 assist    Prior Function Level of Independence: Independent with assistive device(s)      Comments: Pt reports indep with intermittent use of cane. Per daughter, pt always carries her cane, indep with ADLs, able to help with household tasks   PT Goals (current goals can now be found in the care plan section) Acute Rehab PT Goals Patient Stated Goal: Daughter unsure about SNF PT Goal Formulation: With patient/family Time For Goal Achievement: 06/06/18 Progress towards PT goals: Progressing toward goals    Frequency    Min 3X/week      PT Plan Current plan remains appropriate    Co-evaluation              AM-PAC PT "6 Clicks" Mobility   Outcome Measure  Help needed turning from your back to your side while in a flat bed without using bedrails?: None Help needed moving from lying on your back to sitting on the side of a flat bed without using bedrails?: None Help needed moving to and from a bed to a chair (including a wheelchair)?: A  Little Help needed standing up from a chair using your arms (e.g., wheelchair or bedside chair)?: A Little Help needed to walk in hospital room?: A Little Help needed climbing 3-5 steps with a railing? : A Little 6 Click Score: 20    End of Session Equipment Utilized During Treatment: Gait belt Activity Tolerance: Other (comment)(Patient limited by confusion) Patient left: in chair;with call bell/phone within reach;with chair alarm set Nurse Communication: Mobility status PT Visit Diagnosis: Other abnormalities of gait and mobility (R26.89);Unsteadiness on feet (R26.81)     Time: 7017-7939 PT Time Calculation (min) (ACUTE ONLY): 16 min  Charges:  $Gait Training: 8-22 mins $Self Care/Home Management: [place], PT, DPT Acute Rehabilitation Services  Pager [phone] Office [phone]  Derry Lory 05/23/2018, 9:55 AM

## 2018-05-23 NOTE — Progress Notes (Addendum)
STROKE TEAM PROGRESS NOTE   INTERVAL HISTORY No family at bedside. Patient awake, alert and oriented x 4. Sitting up in chair. discused POC: EEG: pending.  Vitals:   05/23/18 0124 05/23/18 0200 05/23/18 0615 05/23/18 0651  BP: (!) 170/82  (!) 181/83 (!) 176/85  Pulse:      Resp: (!) 21   20  Temp:      TempSrc:      SpO2:      Weight:  63.4 kg    Height:        CBC:  Recent Labs  Lab 05/22/18 0617 05/23/18 0404  WBC 11.8* 16.7*  NEUTROABS 8.9*  --   HGB 12.4 12.0  HCT 38.1 36.4  MCV 97.4 96.6  PLT 102* 122*    Basic Metabolic Panel:  Recent Labs  Lab 05/22/18 0617 05/23/18 0404  NA 141 141  K 5.0 4.2  CL 110 110  CO2 19* 20*  GLUCOSE 112* 114*  BUN 35* 32*  CREATININE 1.54* 1.43*  CALCIUM 9.6 9.1   Lipid Panel:     Component Value Date/Time   CHOL 198 05/22/2018 0617   TRIG 96 05/22/2018 0617   HDL 63 05/22/2018 0617   CHOLHDL 3.1 05/22/2018 0617   VLDL 19 05/22/2018 0617   LDLCALC 116 (H) 05/22/2018 0617   HgbA1c:  Lab Results  Component Value Date   HGBA1C 6.0 (H) 05/22/2018   Urine Drug Screen: No results found for: LABOPIA, COCAINSCRNUR, LABBENZ, AMPHETMU, THCU, LABBARB  Alcohol Level No results found for: [facility]  IMAGING Dg Chest 2 View  Result Date: 05/22/2018 CLINICAL DATA:  Altered mental status and fever EXAM: CHEST - 2 VIEW COMPARISON:  None. FINDINGS: Extensive artifact from EKG leads on the AP view. Likely normal heart size and mediastinal contours when accounting for rightward rotation. No focal airspace opacity on both views. Minor atelectatic changes or scarring at the bases. No edema, effusion, or pneumothorax. IMPRESSION: Limited study without acute finding. Electronically Signed   By: Monte Fantasia M.D.   On: 05/22/2018 06:49   Ct Head Wo Contrast  Result Date: 05/22/2018 CLINICAL DATA:  Unexplained altered level of consciousness EXAM: CT HEAD WITHOUT CONTRAST TECHNIQUE: Contiguous axial images were obtained from the base of the  skull through the vertex without intravenous contrast. COMPARISON:  None. FINDINGS: Brain: No evidence of acute infarction, hemorrhage, hydrocephalus, extra-axial collection or mass lesion/mass effect. Mild for age volume loss. Mild or moderate chronic small vessel ischemia in the cerebral white matter. Vascular: No hyperdense vessel or unexpected calcification. Skull: Normal. Negative for fracture or focal lesion. Sinuses/Orbits: Negative IMPRESSION: Age congruent senescent changes without acute finding. Electronically Signed   By: Monte Fantasia M.D.   On: 05/22/2018 07:58   Mr Jodene Nam Head Wo Contrast  Result Date: 05/22/2018 CLINICAL DATA:  Initial evaluation for slurred speech with unilateral weakness. EXAM: MRI HEAD WITHOUT CONTRAST MRA HEAD WITHOUT CONTRAST MRA NECK WITHOUT CONTRAST TECHNIQUE: Multiplanar, multiecho pulse sequences of the brain and surrounding structures were obtained without intravenous contrast. Angiographic images of the Circle of Willis were obtained using MRA technique without intravenous contrast. Angiographic images of the neck were obtained using MRA technique without intravenous contrast. Carotid stenosis measurements (when applicable) are obtained utilizing NASCET criteria, using the distal internal carotid diameter as the denominator. COMPARISON:  Prior CT from earlier the same day. FINDINGS: MRI HEAD FINDINGS Brain: Generalized age-related cerebral volume loss with mild-to-moderate chronic small vessel ischemic disease. Superimposed remote lacunar infarct present within the  left thalamus and right midbrain. There is subtle 3-4 mm diffusion abnormality involving the cortical gray matter of the posterior left temporal region, which could reflect a tiny acute ischemic left MCA territory infarct (series 3, image 23). No other convincing evidence for acute or subacute ischemia. Gray-white matter differentiation otherwise maintained. No encephalomalacia to suggest chronic cortical  infarction. No evidence for acute intracranial hemorrhage. Single punctate chronic microhemorrhage noted within the anterior right frontal lobe, of doubtful significance in isolation. No mass lesion, midline shift or mass effect. No hydrocephalus. No extra-axial fluid collection. Pituitary gland normal. Vascular: Major intracranial vascular flow voids are maintained. Skull and upper cervical spine: Craniocervical junction within normal limits. Bone marrow signal intensity normal. No scalp soft tissue abnormality. Sinuses/Orbits: Globes and orbital soft tissues within normal limits. Paranasal sinuses are clear. Trace left mastoid effusion, of doubtful significance. Other: None. MRA HEAD FINDINGS ANTERIOR CIRCULATION: Distal cervical segments of the internal carotid arteries are widely patent with symmetric cranial good flow. Petrous segments widely patent bilaterally. Mild atheromatous irregularity within the cavernous/supraclinoid ICAs without hemodynamically significant stenosis. ICA termini well perfused bilaterally. A1 segments widely patent. Normal anterior communicating artery. Anterior cerebral arteries widely patent to their distal aspects. No M1 stenosis or occlusion. Distal MCA branches well perfused and symmetric. POSTERIOR CIRCULATION: Vertebral arteries patent to the vertebrobasilar junction without flow-limiting stenosis. Right vertebral artery dominant. Left PICA patent proximally. Right PICA not seen. Basilar widely patent to its distal aspect without stenosis. Superior cerebral arteries patent bilaterally. Left PCA supplied via the basilar. Right PCA supplied via the basilar as well as a prominent right posterior communicating artery. PCAs demonstrate mild atheromatous irregularity but are patent to their distal aspects without significant stenosis. No intracranial aneurysm. MRA NECK FINDINGS Examination technically limited by lack of IV contrast and motion artifact. Aortic arch and origin of the  great vessels not visualized on this exam. Visualized right common carotid artery patent to the bifurcation without stenosis. Atheromatous irregularity about the right bifurcation with associated stenoses of up to approximately 30 % by NASCET criteria. Visualized right ICA widely patent distally without stenosis. Visualized left common carotid artery patent to the bifurcation without stenosis. Mild atheromatous irregularity about the left bifurcation without hemodynamically significant stenosis. Visualized left ICA widely patent distally. Visualized vertebral arteries patent within the neck with antegrade flow. Left vertebral artery dominant. No significant flow-limiting stenosis identified on this limited exam. IMPRESSION: MRI HEAD IMPRESSION: 1. Few small 3-4 mm punctate foci of diffusion abnormality involving the posterior left temporal cortex, suspicious for possible tiny acute ischemic infarcts. No associated hemorrhage. 2. Underlying age-related cerebral atrophy with mild to moderate chronic small vessel ischemic disease. MRA HEAD IMPRESSION: Mild intracranial atherosclerotic change for age. No large vessel occlusion. No hemodynamically significant or correctable stenosis. MRA NECK IMPRESSION: 1. Approximate 30% atheromatous stenosis about the origin of the right ICA. Right carotid artery system otherwise widely patent. 2. No significant flow-limiting stenosis within the left carotid artery system. 3. Patent vertebral arteries within the neck with antegrade flow. Left vertebral artery dominant. Electronically Signed   By: Jeannine Boga M.D.   On: 05/22/2018 20:20   Mr Jodene Nam Neck Wo Contrast  Result Date: 05/22/2018 CLINICAL DATA:  Initial evaluation for slurred speech with unilateral weakness. EXAM: MRI HEAD WITHOUT CONTRAST MRA HEAD WITHOUT CONTRAST MRA NECK WITHOUT CONTRAST TECHNIQUE: Multiplanar, multiecho pulse sequences of the brain and surrounding structures were obtained without intravenous  contrast. Angiographic images of the Circle of Willis were obtained using MRA  technique without intravenous contrast. Angiographic images of the neck were obtained using MRA technique without intravenous contrast. Carotid stenosis measurements (when applicable) are obtained utilizing NASCET criteria, using the distal internal carotid diameter as the denominator. COMPARISON:  Prior CT from earlier the same day. FINDINGS: MRI HEAD FINDINGS Brain: Generalized age-related cerebral volume loss with mild-to-moderate chronic small vessel ischemic disease. Superimposed remote lacunar infarct present within the left thalamus and right midbrain. There is subtle 3-4 mm diffusion abnormality involving the cortical gray matter of the posterior left temporal region, which could reflect a tiny acute ischemic left MCA territory infarct (series 3, image 23). No other convincing evidence for acute or subacute ischemia. Gray-white matter differentiation otherwise maintained. No encephalomalacia to suggest chronic cortical infarction. No evidence for acute intracranial hemorrhage. Single punctate chronic microhemorrhage noted within the anterior right frontal lobe, of doubtful significance in isolation. No mass lesion, midline shift or mass effect. No hydrocephalus. No extra-axial fluid collection. Pituitary gland normal. Vascular: Major intracranial vascular flow voids are maintained. Skull and upper cervical spine: Craniocervical junction within normal limits. Bone marrow signal intensity normal. No scalp soft tissue abnormality. Sinuses/Orbits: Globes and orbital soft tissues within normal limits. Paranasal sinuses are clear. Trace left mastoid effusion, of doubtful significance. Other: None. MRA HEAD FINDINGS ANTERIOR CIRCULATION: Distal cervical segments of the internal carotid arteries are widely patent with symmetric cranial good flow. Petrous segments widely patent bilaterally. Mild atheromatous irregularity within the  cavernous/supraclinoid ICAs without hemodynamically significant stenosis. ICA termini well perfused bilaterally. A1 segments widely patent. Normal anterior communicating artery. Anterior cerebral arteries widely patent to their distal aspects. No M1 stenosis or occlusion. Distal MCA branches well perfused and symmetric. POSTERIOR CIRCULATION: Vertebral arteries patent to the vertebrobasilar junction without flow-limiting stenosis. Right vertebral artery dominant. Left PICA patent proximally. Right PICA not seen. Basilar widely patent to its distal aspect without stenosis. Superior cerebral arteries patent bilaterally. Left PCA supplied via the basilar. Right PCA supplied via the basilar as well as a prominent right posterior communicating artery. PCAs demonstrate mild atheromatous irregularity but are patent to their distal aspects without significant stenosis. No intracranial aneurysm. MRA NECK FINDINGS Examination technically limited by lack of IV contrast and motion artifact. Aortic arch and origin of the great vessels not visualized on this exam. Visualized right common carotid artery patent to the bifurcation without stenosis. Atheromatous irregularity about the right bifurcation with associated stenoses of up to approximately 30 % by NASCET criteria. Visualized right ICA widely patent distally without stenosis. Visualized left common carotid artery patent to the bifurcation without stenosis. Mild atheromatous irregularity about the left bifurcation without hemodynamically significant stenosis. Visualized left ICA widely patent distally. Visualized vertebral arteries patent within the neck with antegrade flow. Left vertebral artery dominant. No significant flow-limiting stenosis identified on this limited exam. IMPRESSION: MRI HEAD IMPRESSION: 1. Few small 3-4 mm punctate foci of diffusion abnormality involving the posterior left temporal cortex, suspicious for possible tiny acute ischemic infarcts. No  associated hemorrhage. 2. Underlying age-related cerebral atrophy with mild to moderate chronic small vessel ischemic disease. MRA HEAD IMPRESSION: Mild intracranial atherosclerotic change for age. No large vessel occlusion. No hemodynamically significant or correctable stenosis. MRA NECK IMPRESSION: 1. Approximate 30% atheromatous stenosis about the origin of the right ICA. Right carotid artery system otherwise widely patent. 2. No significant flow-limiting stenosis within the left carotid artery system. 3. Patent vertebral arteries within the neck with antegrade flow. Left vertebral artery dominant. Electronically Signed   By: Jeannine Boga  M.D.   On: 05/22/2018 20:20   Mr Brain Wo Contrast  Result Date: 05/22/2018 CLINICAL DATA:  Initial evaluation for slurred speech with unilateral weakness. EXAM: MRI HEAD WITHOUT CONTRAST MRA HEAD WITHOUT CONTRAST MRA NECK WITHOUT CONTRAST TECHNIQUE: Multiplanar, multiecho pulse sequences of the brain and surrounding structures were obtained without intravenous contrast. Angiographic images of the Circle of Willis were obtained using MRA technique without intravenous contrast. Angiographic images of the neck were obtained using MRA technique without intravenous contrast. Carotid stenosis measurements (when applicable) are obtained utilizing NASCET criteria, using the distal internal carotid diameter as the denominator. COMPARISON:  Prior CT from earlier the same day. FINDINGS: MRI HEAD FINDINGS Brain: Generalized age-related cerebral volume loss with mild-to-moderate chronic small vessel ischemic disease. Superimposed remote lacunar infarct present within the left thalamus and right midbrain. There is subtle 3-4 mm diffusion abnormality involving the cortical gray matter of the posterior left temporal region, which could reflect a tiny acute ischemic left MCA territory infarct (series 3, image 23). No other convincing evidence for acute or subacute ischemia.  Gray-white matter differentiation otherwise maintained. No encephalomalacia to suggest chronic cortical infarction. No evidence for acute intracranial hemorrhage. Single punctate chronic microhemorrhage noted within the anterior right frontal lobe, of doubtful significance in isolation. No mass lesion, midline shift or mass effect. No hydrocephalus. No extra-axial fluid collection. Pituitary gland normal. Vascular: Major intracranial vascular flow voids are maintained. Skull and upper cervical spine: Craniocervical junction within normal limits. Bone marrow signal intensity normal. No scalp soft tissue abnormality. Sinuses/Orbits: Globes and orbital soft tissues within normal limits. Paranasal sinuses are clear. Trace left mastoid effusion, of doubtful significance. Other: None. MRA HEAD FINDINGS ANTERIOR CIRCULATION: Distal cervical segments of the internal carotid arteries are widely patent with symmetric cranial good flow. Petrous segments widely patent bilaterally. Mild atheromatous irregularity within the cavernous/supraclinoid ICAs without hemodynamically significant stenosis. ICA termini well perfused bilaterally. A1 segments widely patent. Normal anterior communicating artery. Anterior cerebral arteries widely patent to their distal aspects. No M1 stenosis or occlusion. Distal MCA branches well perfused and symmetric. POSTERIOR CIRCULATION: Vertebral arteries patent to the vertebrobasilar junction without flow-limiting stenosis. Right vertebral artery dominant. Left PICA patent proximally. Right PICA not seen. Basilar widely patent to its distal aspect without stenosis. Superior cerebral arteries patent bilaterally. Left PCA supplied via the basilar. Right PCA supplied via the basilar as well as a prominent right posterior communicating artery. PCAs demonstrate mild atheromatous irregularity but are patent to their distal aspects without significant stenosis. No intracranial aneurysm. MRA NECK FINDINGS  Examination technically limited by lack of IV contrast and motion artifact. Aortic arch and origin of the great vessels not visualized on this exam. Visualized right common carotid artery patent to the bifurcation without stenosis. Atheromatous irregularity about the right bifurcation with associated stenoses of up to approximately 30 % by NASCET criteria. Visualized right ICA widely patent distally without stenosis. Visualized left common carotid artery patent to the bifurcation without stenosis. Mild atheromatous irregularity about the left bifurcation without hemodynamically significant stenosis. Visualized left ICA widely patent distally. Visualized vertebral arteries patent within the neck with antegrade flow. Left vertebral artery dominant. No significant flow-limiting stenosis identified on this limited exam. IMPRESSION: MRI HEAD IMPRESSION: 1. Few small 3-4 mm punctate foci of diffusion abnormality involving the posterior left temporal cortex, suspicious for possible tiny acute ischemic infarcts. No associated hemorrhage. 2. Underlying age-related cerebral atrophy with mild to moderate chronic small vessel ischemic disease. MRA HEAD IMPRESSION: Mild intracranial atherosclerotic change  for age. No large vessel occlusion. No hemodynamically significant or correctable stenosis. MRA NECK IMPRESSION: 1. Approximate 30% atheromatous stenosis about the origin of the right ICA. Right carotid artery system otherwise widely patent. 2. No significant flow-limiting stenosis within the left carotid artery system. 3. Patent vertebral arteries within the neck with antegrade flow. Left vertebral artery dominant. Electronically Signed   By: Jeannine Boga M.D.   On: 05/22/2018 20:20    PHYSICAL EXAM Constitutional: Appears well-developed and well-nourished.  Eyes: No scleral injection HENT: No OP obstrucion Head: Normocephalic.  Cardiovascular: Normal rate and regular rhythm.  Respiratory: Effort normal,  non-labored breathing GI: Soft.  No distension. There is no tenderness.  Skin: WDI  Neuro: Mental Status: Patient is awake, alert oriented to name/month/year/place/age. Speech is fluent. Patient is able to follow simple motor commands. Takes patient a moment to comprehend some commands, but was then able to follow.   Cranial Nerves: II: Visual fields are full.  III,IV, VI: EOMI without ptosis or diplopia. Pupils are equal, round, and reactive to light.   V: Facial sensation is symmetric to temperature VII: Facial movement is symmetric.  VIII: hearing is intact to voice X: Uvula elevates symmetrically XI: Shoulder shrug is symmetric. XII: tongue is midline without atrophy or fasciculations.  Motor: 4/5 throughout. No asymmetry.  Sensory: Sensation is symmetric to light touch and temperature in the arms and legs. Deep Tendon Reflexes: 2+ and symmetric in the biceps and patellae.  Plantars: Toes are downgoing bilaterally.  Cerebellar: FNF within normal limits bilaterally.  She did have dysmetria with right H-S but left H-S was normal    ASSESSMENT/PLAN Ms. Rhonda Sharp is a 85 y.o. female with history of HTN, Migraine, Stroke (2001) and possible neurocognitive decline presenting with  Expressive, right arm weakness.   Hypertensive vs. Metabolic encephalopathy, improved  BP was high on admission  Pt presentation way out of proportion to MRI changes  UA WBC 11-20  Urine culture pending  EEG diffuse slow, no seizure  Ammonia and TSH WNL  Now much improved  Resumed bystolic  Gradually normalize BP in 3-4 days  Long term BP goal normotensive  Treat underlying condition as per primary team  Stroke:  Incidental finding, right temporal 2-3 punctate infarcts at subcortical and juxtacortical region, likely secondary to small vessel disease source.  CT head : No hemorrhage; Age congruent senescent changes without acute finding.  MRI  Few small 3-4 mm punctate foci  of diffusion abnormality left temporal region  MRA Head and Neck unremarkable   2D Echo  pending  Although cardioembolic source can not be completely ruled out, but will recommend to defer embolic work up given pt uncontrolled HTN and not a good candidate for [facility]  LDL 116  HgbA1c 6.0  Lovenox for VTE prophylaxis  aspirin 81 mg daily prior to admission, now on aspirin 81 mg daily. Continue ASA on discharge. Do not recommend DAPT or anticoagulation at this time given uncontrolled HTN.   Therapy recommendations: PT/OT SNF or  Home health PT; 24 hour supervision   Disposition:  pending  Hyperlipidemia  Home meds:  none, not resumed in hospital  LDL 116, goal < 70  Add pravastatin 20 mg  Continue statin at discharge  Other Stroke Risk Factors  Advanced age  Other Active Problems  CKD stage III Cre 1.54->1.43  Leukocytosis - WBC 11.8->16.7  Hospital day # 1  Neurology will sign off. Please call with questions. Pt will follow up with stroke clinic  NP at [facility] in about 4 weeks. Thanks for the consult.  Rosalin Hawking, MD PhD Stroke Neurology 05/23/2018 3:18 PM    To contact Stroke Continuity provider, please refer to [email or web address] After hours, contact General Neurology

## 2018-05-23 NOTE — Procedures (Signed)
History: 85 year old female being evaluated for encephalopathy.  Sedation: None  Technique: This is a 21 channel routine scalp EEG performed at the bedside with bipolar and monopolar montages arranged in accordance to the international 10/20 system of electrode placement. One channel was dedicated to EKG recording.    Background: There is a well-formed, well sustained posterior dominant rhythm of 7 Hz which attenuates with eye opening.  With drowsiness, there is an increase in delta activity, the sleep was not recorded.  Photic stimulation: Physiologic driving is not performed  EEG Abnormalities: 1) slow PDR  Clinical Interpretation: This borderline EEG is consistent with a cerebral dysfunction as can be seen with dementing disorders, though this pattern can also be seen in normal elderly adults.   There was no seizure or seizure predisposition recorded on this study. Please note that lack of epileptiform activity on EEG does not preclude the possibility of epilepsy.   Rhonda Rack, MD [facility] [phone]  If 7pm- 7am, please page neurology on call as listed in [place].

## 2018-05-23 NOTE — Evaluation (Signed)
Physical Therapy Evaluation Patient Details Name: Rhonda Sharp MRN: [id number] DOB: 1929/01/27 Today's Date: 05/23/2018   History of Present Illness  Pt is an 85 y.o. female admitted 05/22/17 with AMS and hypertensive urgency. CXR without acute findings. Head CT negative for acute findings. Per neurology, pt likely has an undiagnosed underlying dementia, with acute worsening secondary to hypertensive encephalopathy. PMH includes HTN, glaucoma.    Clinical Impression  Pt presents with an overall decrease in functional mobility secondary to above. PTA, pt mod indep with SPC and lives with daughter. Today, pt required min guard for safety with mobility and ambulation in room. Pt limited by generalized weakness, instability, and impaired cognition, including decreased safety awareness and poor problem solving. Currently recommend SNF-level therapies to maximize functional mobility and independence prior to return home, but question if an additional unfamiliar environment would continue to negatively affect pt's cognition. Will follow acutely to address established goals.    Follow Up Recommendations SNF;Home health PT;Supervision/Assistance - 24 hour    Equipment Recommendations  Rolling walker with 5" wheels    Recommendations for Other Services       Precautions / Restrictions Precautions Precautions: Fall Restrictions Weight Bearing Restrictions: No      Mobility  Bed Mobility Overal bed mobility: Modified Independent                Transfers Overall transfer level: Needs assistance Equipment used: None Transfers: Sit to/from Stand Sit to Stand: Min guard         General transfer comment: Min guard for balance as pt unsteady upon standing without UE support  Ambulation/Gait Ambulation/Gait assistance: Min guard Gait Distance (Feet): 40 Feet Assistive device: None;Rolling walker (2 wheeled) Gait Pattern/deviations: Step-to pattern;Narrow base of support;Trunk  flexed Gait velocity: Decreased Gait velocity interpretation: 1.31 - 2.62 ft/sec, indicative of limited community ambulator General Gait Details: Ambulatory in room with and without RW; offerred pt RW which she said she wanted to use, then she would leave it behind randomly en route to/from bathroom, not able to explain her reasoning for this  Stairs            Wheelchair Mobility    Modified Rankin (Stroke Patients Only) Modified Rankin (Stroke Patients Only) Pre-Morbid Rankin Score: No symptoms Modified Rankin: Moderately severe disability     Balance Overall balance assessment: Needs assistance   Sitting balance-Leahy Scale: Good       Standing balance-Leahy Scale: Fair Standing balance comment: Can static stand and take steps without UE support; min guard as pt unstable                             Pertinent Vitals/Pain Pain Assessment: No/denies pain    Home Living Family/patient expects to be discharged to:: Private residence Living Arrangements: Children;Other relatives Available Help at Discharge: Family;Available 24 hours/day Type of Home: House         Home Equipment: Kasandra Knudsen - single point Additional Comments: Pt recently moved from [place] to live with daughter. Daughter reports she is available for 24/7 assist    Prior Function Level of Independence: Independent with assistive device(s)         Comments: Pt reports indep with intermittent use of cane. Per daughter, pt always carries her cane, indep with ADLs, able to help with household tasks     Hand Dominance        Extremity/Trunk Assessment   Upper Extremity Assessment Upper Extremity Assessment: Overall  WFL for tasks assessed(pt declined MMT; functionally, strength seems >3/5)    Lower Extremity Assessment Lower Extremity Assessment: Generalized weakness    Cervical / Trunk Assessment Cervical / Trunk Assessment: Kyphotic  Communication   Communication: (question  receptive/expressive difficulties?)  Cognition Arousal/Alertness: Awake/alert Behavior During Therapy: Flat affect Overall Cognitive Status: No family/caregiver present to determine baseline cognitive functioning Area of Impairment: Orientation;Attention;Following commands;Safety/judgement;Awareness;Problem solving                 Orientation Level: Disoriented to;Situation Current Attention Level: Sustained   Following Commands: Follows one step commands with increased time;Follows one step commands inconsistently Safety/Judgement: Decreased awareness of safety;Decreased awareness of deficits Awareness: Emergent Problem Solving: Slow processing;Decreased initiation;Requires verbal cues General Comments: Pt interacting appropriately, although seems to take increased time to process some questions and get out answers. [facility], but did not know [facility] (apparently recently moved here from [place]). When it came time to getting out of bed, pt reluctant and seemed very suspicious; reports this is from staff not helping her last night apparently. Very poor safety awareness throughout session. Per RN, combative with staff this morning.       General Comments General comments (skin integrity, edema, etc.): Supine BP 167/69, post-mobility BP 159/110    Exercises     Assessment/Plan    PT Assessment Patient needs continued PT services  PT Problem List Decreased strength;Decreased activity tolerance;Decreased balance;Decreased mobility;Decreased cognition;Decreased safety awareness;Decreased knowledge of use of DME;Cardiopulmonary status limiting activity       PT Treatment Interventions DME instruction;Gait training;Stair training;Functional mobility training;Therapeutic activities;Therapeutic exercise;Balance training;Neuromuscular re-education;Cognitive remediation;Patient/family education    PT Goals (Current goals can be found in the Care Plan section)  Acute Rehab  PT Goals Patient Stated Goal: Unsure about SNF PT Goal Formulation: With patient Time For Goal Achievement: 06/06/18    Frequency Min 3X/week   Barriers to discharge        Co-evaluation               AM-PAC PT "6 Clicks" Mobility  Outcome Measure Help needed turning from your back to your side while in a flat bed without using bedrails?: None Help needed moving from lying on your back to sitting on the side of a flat bed without using bedrails?: None Help needed moving to and from a bed to a chair (including a wheelchair)?: A Little Help needed standing up from a chair using your arms (e.g., wheelchair or bedside chair)?: A Little Help needed to walk in hospital room?: A Little Help needed climbing 3-5 steps with a railing? : A Little 6 Click Score: 20    End of Session Equipment Utilized During Treatment: Gait belt Activity Tolerance: Patient tolerated treatment well Patient left: in chair;with call bell/phone within reach;with chair alarm set Nurse Communication: Mobility status PT Visit Diagnosis: Other abnormalities of gait and mobility (R26.89);Unsteadiness on feet (R26.81)    Time: 2836-6294 PT Time Calculation (min) (ACUTE ONLY): 30 min   Charges:   PT Evaluation $PT Eval Moderate Complexity: 1 Mod PT Treatments $Gait Training: 8-22 mins      Mabeline Caras, PT, DPT Acute Rehabilitation Services  Pager [phone] Office [place] 05/23/2018, 9:46 AM

## 2018-05-23 NOTE — Progress Notes (Signed)
EEG Completed; Results Pending  

## 2018-05-23 NOTE — Progress Notes (Signed)
   Subjective: Rhonda Sharp was seen and evaluated at bedside. She was alert and oriented on our evaluation this morning. She was feeling well with the only complain being that her legs were hurting. Denies headaches, chest pain or shortness of breath. We explained that we are working on getting her blood pressure under better control which she understood and was in agreement with.   Objective:  Vital signs in last 24 hours: Vitals:   05/23/18 0109 05/23/18 0124 05/23/18 0200 05/23/18 0615  BP: (!) 198/86 (!) 170/82  (!) 181/83  Pulse:      Resp:  (!) 21    Temp:      TempSrc:      SpO2:      Weight:   63.4 kg   Height:       General: awake, alert, sitting up in bed in NAD CV: RRR; no murmurs, rubs or gallops  Neuro: A&Ox3; Cranial nerves II-XII intact; strength and sensation intact throughout   Assessment/Plan:  Principal Problem:   Acute encephalopathy Active Problems:   Essential hypertension  1. Acute encephalopathy:  - most likely hypertensive given improvement in symptoms since decreased blood pressure - blood pressure trending down with Amlodipine 5 and Bystolic 10 mg with goal to gradually normalize over the next 3-4 days  - metabolic etiology seems less likely given unremarkable work-up thus far including influenza panel, CXR and U/A; however white count trended up today to 16.7 which we will continue to monitor - neuro has also ordered EEG to rule out underlying seizure activity   2. CVA: - MRI with small punctate foci; MRA head and neck unremarkable; likely in the setting of severe HTN  - 2D echo pending - neuro did not recommend further embolic work-up given uncontrolled HTN being most likely etiology  - started on aspirin and high intensity statin - working on blood pressure control as above   Dispo: Anticipated discharge in approximately 1-2 day(s) pending further work-up and blood pressure control.   Modena Nunnery D, DO 05/23/2018, 6:34 AM Pager:  [phone]

## 2018-05-24 ENCOUNTER — Inpatient Hospital Stay (HOSPITAL_COMMUNITY): Payer: Medicare Other

## 2018-05-24 DIAGNOSIS — R441 Visual hallucinations: Secondary | ICD-10-CM

## 2018-05-24 DIAGNOSIS — D696 Thrombocytopenia, unspecified: Secondary | ICD-10-CM | POA: Diagnosis present

## 2018-05-24 DIAGNOSIS — N183 Chronic kidney disease, stage 3 (moderate): Secondary | ICD-10-CM

## 2018-05-24 DIAGNOSIS — F1010 Alcohol abuse, uncomplicated: Secondary | ICD-10-CM | POA: Diagnosis present

## 2018-05-24 DIAGNOSIS — I161 Hypertensive emergency: Secondary | ICD-10-CM

## 2018-05-24 DIAGNOSIS — N179 Acute kidney failure, unspecified: Secondary | ICD-10-CM

## 2018-05-24 DIAGNOSIS — I129 Hypertensive chronic kidney disease with stage 1 through stage 4 chronic kidney disease, or unspecified chronic kidney disease: Secondary | ICD-10-CM

## 2018-05-24 DIAGNOSIS — I6389 Other cerebral infarction: Secondary | ICD-10-CM

## 2018-05-24 DIAGNOSIS — Z79899 Other long term (current) drug therapy: Secondary | ICD-10-CM

## 2018-05-24 DIAGNOSIS — R413 Other amnesia: Secondary | ICD-10-CM

## 2018-05-24 LAB — ECHOCARDIOGRAM COMPLETE
Height: 67 in
Weight: 2236.35 oz

## 2018-05-24 LAB — BASIC METABOLIC PANEL
ANION GAP: 10 (ref 5–15)
BUN: 40 mg/dL — ABNORMAL HIGH (ref 8–23)
CO2: 21 mmol/L — ABNORMAL LOW (ref 22–32)
Calcium: 8.7 mg/dL — ABNORMAL LOW (ref 8.9–10.3)
Chloride: 111 mmol/L (ref 98–111)
Creatinine, Ser: 1.78 mg/dL — ABNORMAL HIGH (ref 0.44–1.00)
GFR calc Af Amer: 29 mL/min — ABNORMAL LOW (ref >60)
GFR calc non Af Amer: 25 mL/min — ABNORMAL LOW (ref >60)
Glucose, Bld: 93 mg/dL (ref 70–99)
Potassium: 3.8 mmol/L (ref 3.5–5.1)
Sodium: 142 mmol/L (ref 135–145)

## 2018-05-24 LAB — CBC
HEMATOCRIT: 33.2 % — AB (ref 36.0–46.0)
Hemoglobin: 10.9 g/dL — ABNORMAL LOW (ref 12.0–15.0)
MCH: 31.7 pg (ref 26.0–34.0)
MCHC: 32.8 g/dL (ref 30.0–36.0)
MCV: 96.5 fL (ref 80.0–100.0)
Platelets: 109 10*3/uL — ABNORMAL LOW (ref 150–400)
RBC: 3.44 MIL/uL — ABNORMAL LOW (ref 3.87–5.11)
RDW: 12.5 % (ref 11.5–15.5)
WBC: 8.6 10*3/uL (ref 4.0–10.5)
nRBC: 0 % (ref 0.0–0.2)

## 2018-05-24 MED ORDER — HYDRALAZINE HCL 25 MG PO TABS
25.0000 mg | ORAL_TABLET | Freq: Three times a day (TID) | ORAL | Status: DC
  Administered 2018-05-24 – 2018-05-25 (×3): 25 mg via ORAL
  Filled 2018-05-24 (×3): qty 1

## 2018-05-24 MED ORDER — AMLODIPINE BESYLATE 10 MG PO TABS
10.0000 mg | ORAL_TABLET | Freq: Every day | ORAL | Status: DC
  Administered 2018-05-25: 10 mg via ORAL
  Filled 2018-05-24: qty 1

## 2018-05-24 MED ORDER — LACTATED RINGERS IV SOLN
INTRAVENOUS | Status: AC
  Administered 2018-05-24 (×2): via INTRAVENOUS

## 2018-05-24 NOTE — Progress Notes (Signed)
   Subjective: Rhonda Sharp was seen and evaluated at bedside. No acute events overnight. She was drowsy on our evaluation today and not super responsive, but did not complain of any pain anywhere. Daughter and granddaughter accompanied her at bedside. They reiterated that they would like to take her home when she is ready.    Objective:  Vital signs in last 24 hours: Vitals:   05/23/18 2001 05/24/18 0005 05/24/18 0010 05/24/18 0430  BP: (!) 170/76 (!) 188/77  (!) 152/80  Pulse: 66     Resp: 18     Temp: 98.8 F (37.1 C)  97.6 F (36.4 C)   TempSrc: Oral  Oral   SpO2: 96%     Weight:      Height:       General: drowsy but arousable; resting comfortably in bed CV: RRR; no murmurs, rubs or gallops Ext: no edema  Assessment/Plan:  Principal Problem:   Acute encephalopathy Active Problems:   Essential hypertension   Cerebral thrombosis with cerebral infarction   Cerebrovascular accident (CVA) ([place])  1. Acute encephalopathy:  - most likely hypertensive given improvement in symptoms since decreased blood pressure - blood pressure improved but still elevated; will increased Amlodipine to 10 mg and add hydralazine 25 mg TID. Continue home Bystolic.  - metabolic etiologies including infectious and electrolyte abnormalities have been ruled out - EEG did not show any epileptic activity   2. CVA: in the setting of hypertensive emergency  - MRI with small punctate foci; MRA head and neck unremarkable - 2D echo pending - neuro did not recommend further embolic work-up given uncontrolled HTN being most likely etiology  - started on aspirin and high intensity statin - working on blood pressure control as above   3. AKI on CKD III - likely multifactorial due to poor PO intake and hypertensive emergency - will receive LR at 75 cc/hr - recheck BMP in the morning   4. Likely Dementia - daughter notes intermittent memory issues at home as well as visual hallucinations  - may be  vascular given longstanding uncontrolled blood pressure versus Alzheimer's   Dispo: Anticipated discharge home tomorrow.   Modena Nunnery D, DO 05/24/2018, 6:48 AM Pager: [phone]

## 2018-05-24 NOTE — Evaluation (Signed)
Occupational Therapy Evaluation Patient Details Name: Rhonda Sharp MRN: [id number] DOB: July 27, 1928 Today's Date: 05/24/2018    History of Present Illness   Pt is an 85 y.o. female admitted 05/22/17 with AMS and hypertensive urgency. CXR without acute findings. Head CT negative for acute findings. Per neurology, pt likely has an undiagnosed underlying dementia, with acute worsening secondary to hypertensive encephalopathy. PMH includes HTN, glaucoma.   Clinical Impression   Pt is an 85 yo female s/p above dx. Pt PTA: Living with daughter in 2 story home with bed/bath on 2nd floor and pt managing stairs w/ no AD. Pt to have handicapped accessible bathroom to be built in home within a few weeks on main floor. Pt currently, a.o x3. Pt following all commands. Pt;'s bed mobility with supervisionA; transfers and ADL functional mobility with minguardA and RW for stability. Pt somewhat impatient with RW around obstacles so continue to follow closely for stability. Pt set-upA for UB ADL and minA for LB ADL. Pt would benefit from continued OT skilled services for ADL, mobility and safety in [place] setting.     Follow Up Recommendations HHOT       Equipment Recommendations BSC       Recommendations for Other Services       Precautions / Restrictions   none     Mobility supervisionA Bed Mobility supervisionA                  Transfers supervisionA                      Balance  fair- standing and sitting                                         ADL either performed or assessed with clinical judgement   ADL  SupervisionA for feedin/grooming SupervisionA for UB  MinA for LB                                             [facility]  [facility]       Perception     Praxis      Pertinent Vitals/Pain No pain reported     Hand Dominance   right  Extremity/Trunk Assessment             Communication  WFLs   Cognition  A/O x3                                          General Comments       Exercises     Shoulder Instructions      Home Living  2 Level W/ daughter and family Lives on 2nd floor with only half bath on main Walk In shower- access to it.                                        Prior Functioning/Environment   Independent                OT Problem List:  activity tolerance, mobility, strength, ADL  OT Treatment/Interventions:      OT Goals(Current goals can be found in the care plan section) ADL Goals Pt Will Perform Lower Body Dressing: with set-up Pt Will Perform Toileting - Clothing Manipulation and hygiene: with supervision Additional ADL Goal #1: Pt will perform transfers and ADL functional mobility with supervisionA  OT Frequency:     Barriers to D/C:            Co-evaluation              AM-PAC OT "6 Clicks" Daily Activity     Outcome Measure  18               End of Session    Activity Tolerance:   Patient left:  in chair with call light, family in room and safety bell                   Time:  - 1208- 1230p 22 mins   Charges:   1 OT eval moderate  Darryl Nestle) Marsa Aris OTR/L Acute Rehabilitation Services Pager: [phone] Office: [phone]   Fredda Hammed 05/24/2018, 6:14 PM

## 2018-05-24 NOTE — Progress Notes (Signed)
Patient's BP tonight is at 188/76, while moving from chair to bed, patient felt some dizziness.  Gave 5 mg of labetolol with her regular hydralazine dose.  I will keep monitoring patient.

## 2018-05-24 NOTE — Progress Notes (Signed)
  Echocardiogram 2D Echocardiogram has been performed.  Darlina Sicilian M 05/24/2018, 1:51 PM

## 2018-05-25 ENCOUNTER — Telehealth: Payer: Self-pay | Admitting: Internal Medicine

## 2018-05-25 DIAGNOSIS — I63532 Cerebral infarction due to unspecified occlusion or stenosis of left posterior cerebral artery: Secondary | ICD-10-CM

## 2018-05-25 DIAGNOSIS — Z7982 Long term (current) use of aspirin: Secondary | ICD-10-CM

## 2018-05-25 LAB — BASIC METABOLIC PANEL
Anion gap: 11 (ref 5–15)
BUN: 47 mg/dL — ABNORMAL HIGH (ref 8–23)
CALCIUM: 8.7 mg/dL — AB (ref 8.9–10.3)
CO2: 19 mmol/L — ABNORMAL LOW (ref 22–32)
Chloride: 110 mmol/L (ref 98–111)
Creatinine, Ser: 1.7 mg/dL — ABNORMAL HIGH (ref 0.44–1.00)
GFR calc Af Amer: 31 mL/min — ABNORMAL LOW (ref >60)
GFR calc non Af Amer: 27 mL/min — ABNORMAL LOW (ref >60)
Glucose, Bld: 121 mg/dL — ABNORMAL HIGH (ref 70–99)
Potassium: 3.9 mmol/L (ref 3.5–5.1)
Sodium: 140 mmol/L (ref 135–145)

## 2018-05-25 MED ORDER — HYDRALAZINE HCL 50 MG PO TABS: 50.0000 mg | ORAL_TABLET | Freq: Three times a day (TID) | ORAL | 0 refills | Status: DC

## 2018-05-25 MED ORDER — PRAVASTATIN SODIUM 20 MG PO TABS: 20.0000 mg | ORAL_TABLET | Freq: Every day | ORAL | 0 refills | Status: AC

## 2018-05-25 MED ORDER — AMLODIPINE BESYLATE 10 MG PO TABS: 10.0000 mg | ORAL_TABLET | Freq: Every day | ORAL | 0 refills | Status: DC

## 2018-05-25 MED ORDER — HYDRALAZINE HCL 50 MG PO TABS: 50.0000 mg | ORAL_TABLET | Freq: Three times a day (TID) | ORAL | Status: DC

## 2018-05-25 NOTE — Discharge Summary (Signed)
Name: Rhonda Sharp MRN: [id number] DOB: [birth year] 85 y.o. PCP: Haywood Pao, MD  Date of Admission: 05/22/2018  5:53 AM Date of Discharge: 05/25/2018 Attending Physician: Lucious Groves, DO  Discharge Diagnosis: 1. Hypertensive emergency   2. Acute encephalopathy 3. CVA 4. AKI on CKD III 5. Likely Dementia   Discharge Medications: Allergies as of 05/25/2018      Reactions   Sulfa Antibiotics       Medication List    STOP taking these medications   losartan 100 MG tablet Commonly known as:  COZAAR     TAKE these medications   amLODipine 10 MG tablet Commonly known as:  NORVASC Take 1 tablet (10 mg total) by mouth daily.   aspirin 81 MG tablet Take 81 mg by mouth daily.   hydrALAZINE 50 MG tablet Commonly known as:  APRESOLINE Take 1 tablet (50 mg total) by mouth every 8 (eight) hours.   nebivolol 10 MG tablet Commonly known as:  BYSTOLIC Take 10 mg by mouth daily.   pravastatin 20 MG tablet Commonly known as:  PRAVACHOL Take 1 tablet (20 mg total) by mouth daily at 6 PM.       Disposition and follow-up:   Ms.Rhonda Sharp was discharged from [facility] in Good condition.  At the hospital follow up visit please address:  1.  Hypertensive emergency with encephalopathy, CVA and AKI:  - Patient discharged on Amlodipine 10 mg, Bystolic 10 mg, and Hydralazine 50 mg TID; please re-check blood pressure and adjust medications as needed  - CVA: started on aspirin and high intensity statin   - AKI: creatinine at discharge 1.7; please repeat BMP. Did not have outside records to know what her baseline renal function is but had increased from 1.4>1.7 since ED visit at [facility] the day prior to admission   - patient is establishing care with [facility] but could not be seen until early next month  2.  Labs / imaging needed at time of follow-up: BMP  3.  Pending labs/ test needing follow-up: none   Follow-up  Appointments: Follow-up Information    [facility]. Schedule an appointment as soon as possible for a visit in 4 week(s).   Specialty:  Neurology Contact information: [address] [place]       Tisovec, Fransico Him, MD. Daphane Shepherd on 06/14/2018.   Specialty:  Internal Medicine Why:  At 9 am; arrive by 8:30 am.  Contact information: [place] [address] [place] Follow up.   Why:  Durable MEdical Equipment- Rolling Walker to be delivered to patient before she leaves the hospital.  Contact information: [address] [place] [address] [phone]        Care, [facility] Follow up.   Specialty:  Home Health Services Why:  Registered Nurse, Physical/Occupational Therapy: Disciplines to contact the family within 24-48 hours of transition home to schedule visit.  Contact information: [address] [place]  [address] [phone]           Hospital Course by problem list: Ms. Rhonda Sharp is an 85 y/o female with PMHx HTN and heavy alcohol use who presented for recurrent confusion, dysarthia and right sided weakness. Her daughter took her to [facility] in [place] for similar symptoms the previous day. CT and MRI did not show any acute ischemic changes. Her daughter  brought her to [facility] the following morning for return of recurring symptoms. Blood pressure on arrival was 220/97. She received hydralazine in the ED, and her symptoms had resolved by the time the admitting physician came to evaluate. She was admitted for further work-up and blood pressure control.   1. Acute encephalopathy: secondary to hypertensive emergency. Work-up was negative for electrolyte disturbances or infectious etiologies. Neurology was consulted and performed an EEG which did not show epileptic activity, but findings consistent with  underlying dementia. She was initiated on oral anti-hypertensive regimen consisting of her home Bystolic 10 mg, Amlodipine 10 mg and Hydralazine 25 mg TID. Her blood pressure remained elevated, but was within goal of 140-180 in the acute setting. Her mental status returned to baseline, and she was asymptomatic. She was discharged with scheduled follow-up in [facility] for one time blood pressure check and medication adjustment prior to establishing care with [facility] next month.   2. CVA in the setting of hypertensive emergency: repeat CT head was negative. MRI revealed small punctate infarcts in posterior left temporal cortex. MRA of the head and neck unremarkable. Further stroke work-up consisted of 2D echo which revealed LVEF 55-60% with mild LVH, no wall motion abnormalities, G1DD, and mild left atrial dilation. Neuro did not recommend further embolic work-up given uncontrolled HTN being most likely etiology. She was initiated on aspirin and high intensity statin in addition to working on blood pressure control as above.   3. Essential HTN with hypertensive emergency: Patient had not been on any medications for quite some time per daughter. Medication options were somewhat limited due to renal function. May be able to add thiazide if renal function improves.   4. AKI on CKD III: also in the setting of hypertensive emergency. We did not have any outside for what her renal function has been over the last several months. Should improve with better blood control and will need to be monitored periodically outpatient. Crt at discharge 1.7.   5. Likely Dementia: Her daughter reported intermittent memory issues at home as well as visual hallucinations. This may be vascular related given longstanding uncontrolled blood pressure versus Alzheimer's. She may benefit from mini-cog or MMSE when she established care with new PCP.   Discharge Vitals:   BP (!) 159/78 (BP Location: Right Wrist)   Pulse 65   Temp  97.7 F (36.5 C) (Oral)   Resp (!) 21   Ht 5\' 7"  (1.702 m)   Wt 65.5 kg   SpO2 99%   BMI 22.63 kg/m   Pertinent Labs, Studies, and Procedures:  MRI HEAD IMPRESSION:  1. Few small 3-4 mm punctate foci of diffusion abnormality involving the posterior left temporal cortex, suspicious for possible tiny acute ischemic infarcts. No associated hemorrhage. 2. Underlying age-related cerebral atrophy with mild to moderate chronic small vessel ischemic disease.  MRA HEAD IMPRESSION:  Mild intracranial atherosclerotic change for age. No large vessel occlusion. No hemodynamically significant or correctable stenosis.  Discharge Instructions: Discharge Instructions    Call MD for:  difficulty breathing, headache or visual disturbances   Complete by:  As directed    Call MD for:  extreme fatigue   Complete by:  As directed    Diet - low sodium heart healthy   Complete by:  As directed    Discharge instructions   Complete by:  As directed    Ms. Duplechain,   You were admitted to the hospital due to confusion that was caused by your high blood  pressure. We have made adjustements to your blood pressure medicines. Please be sure you take these as directed every day. I moved up your appointment with Dr. Osborne Casco to 06/14/18 at 9 am (arrive by 8:30). We would like your blood pressure and kidney function checked before then so have made you an appointment in our clinic (underneath the Emergency Department) to be seen next Thursday 05/31/18 at 10:45.  You were also started on a cholesterol medication which you will take along with the aspirin for the small stroke you had.   1- We increased your amlodipine to 10 mg daily. We sent a new prescription to your pharmacy 2- Start taking hydralazine 50 mg three times a day 3- Start taking pravastatin 20 mg daily   Continue taking bystolic as usual.   Take care! Dr. Koleen Distance   Increase activity slowly   Complete by:  As directed        Signed: Delice Bison, DO 05/29/2018, 10:28 AM   Pager: [phone]

## 2018-05-25 NOTE — Progress Notes (Signed)
Physical Therapy Treatment Patient Details Name: Rhonda Sharp MRN: [id number] DOB: November 21, 1928 Today's Date: 05/25/2018    History of Present Illness Pt is an 85 y.o. female admitted 05/22/17 with AMS and hypertensive urgency. CXR without acute findings. Head CT negative for acute findings. MRI with small punctate foci. Per neurology, pt likely has an undiagnosed underlying dementia, with acute worsening secondary to hypertensive encephalopathy. PMH includes HTN, glaucoma.   PT Comments    Pt progressing with mobility, ambulatory with RW at supervision-level. Pt declining use of RW at home, but agreeable to use [facility] if needed. Pt also demonstrating improved cognition, although continues to have decreased insight into deficits/safety. Pt motivated to return home today; will have 24/7 support from family. Recommend HHPT services.    Follow Up Recommendations  Home health PT;Supervision/Assistance - 24 hour     Equipment Recommendations  Rolling walker with 5" wheels    Recommendations for Other Services       Precautions / Restrictions Precautions Precautions: Fall Restrictions Weight Bearing Restrictions: No    Mobility  Bed Mobility               General bed mobility comments: Received sitting in recliner  Transfers Overall transfer level: Needs assistance Equipment used: None Transfers: Sit to/from Stand Sit to Stand: Supervision            Ambulation/Gait Ambulation/Gait assistance: Supervision Gait Distance (Feet): 250 Feet Assistive device: Rolling walker (2 wheeled) Gait Pattern/deviations: Step-through pattern;Decreased stride length;Trunk flexed Gait velocity: Decreased Gait velocity interpretation: 1.31 - 2.62 ft/sec, indicative of limited community ambulator General Gait Details: Pt requesting use of RW stating, "I better." Slow, steady gait with RW, supervision for safety. Ambulatory in room without DME, but reaching to furniture for UE support.  Pt declining RW for home-use, but agreeable to use [facility] stating, "If I feel like I need it"   Stairs             Wheelchair Mobility    Modified Rankin (Stroke Patients Only)       Balance Overall balance assessment: Needs assistance   Sitting balance-Leahy Scale: Good       Standing balance-Leahy Scale: Fair Standing balance comment: Can static stand and take steps without UE support                             Cognition Arousal/Alertness: Awake/alert Behavior During Therapy: WFL for tasks assessed/performed Overall Cognitive Status: History of cognitive impairments - at baseline Area of Impairment: Safety/judgement;Memory                     Memory: Decreased short-term memory   Safety/Judgement: Decreased awareness of deficits;Decreased awareness of safety            Exercises      General Comments        Pertinent Vitals/Pain Pain Assessment: No/denies pain    Home Living                      Prior Function            PT Goals (current goals can now be found in the care plan section) Acute Rehab PT Goals Patient Stated Goal: Want to go home PT Goal Formulation: With patient/family Time For Goal Achievement: 06/06/18 Progress towards PT goals: Progressing toward goals    Frequency    Min 3X/week      PT  Plan Discharge plan needs to be updated    Co-evaluation              AM-PAC PT "6 Clicks" Mobility   Outcome Measure  Help needed turning from your back to your side while in a flat bed without using bedrails?: None Help needed moving from lying on your back to sitting on the side of a flat bed without using bedrails?: None Help needed moving to and from a bed to a chair (including a wheelchair)?: None Help needed standing up from a chair using your arms (e.g., wheelchair or bedside chair)?: A Little Help needed to walk in hospital room?: A Little Help needed climbing 3-5 steps with a railing? :  A Little 6 Click Score: 21    End of Session Equipment Utilized During Treatment: Gait belt Activity Tolerance: Patient tolerated treatment well Patient left: in chair;with call bell/phone within reach;with chair alarm set Nurse Communication: Mobility status PT Visit Diagnosis: Other abnormalities of gait and mobility (R26.89);Unsteadiness on feet (R26.81)     Time: 1047-1100 PT Time Calculation (min) (ACUTE ONLY): 13 min  Charges:  $Gait Training: 8-22 mins                    Mabeline Caras, PT, DPT Acute Rehabilitation Services  Pager [phone] Office [place] 05/25/2018, 12:13 PM

## 2018-05-25 NOTE — Telephone Encounter (Signed)
Hospital f/u per Dr Koleen Distance; pt appt 01/23 1045am. One time visit/NW

## 2018-05-25 NOTE — Care Management Note (Signed)
Case Management Note  Patient Details  Name: Rhonda Sharp MRN: [id number] Date of Birth: [birth year]  Subjective/Objective:  Pt presented for acute encephalopathy. PTA from home with daughter. Plan will be to return home. Choice offered to daughter [name].                   Action/Plan: Referral sent to [facility] for [facility] RN, PT, OT services and [facility] to begin within 24-48 hours post transition home. DME to be delivered to bedside via [facility]. Alvis Lemmings PT will assess for lightweight wheelchair. Daughter to provide transportation home. No further needs from CM at this time.   Expected Discharge Date:  05/25/18               Expected Discharge Plan:  [place]  In-House Referral:  Clinical Social Work  Discharge planning Services  CM Consult  Post Acute Care Choice:  Home Health Choice offered to:  Patient, Adult Children  DME Arranged:  Walker rolling DME Agency:  [place] Arranged:  RN, Disease Management, PT, OT(Daughter declined SNF- opted for home with [facility] services. ) [place] Agency:  [facility] [facility]  Status of Service:  Completed, signed off  If discussed at [place] of Stay Meetings, dates discussed:    Additional Comments:  Bethena Roys, RN 05/25/2018, 11:39 AM

## 2018-05-25 NOTE — Progress Notes (Signed)
   Subjective: Rhonda Sharp was seen and evaluated at bedside on morning rounds. She required 1 dose of IV labetalol overnight when she moved from her bed to her chair. Also noted some mild dizziness which quickly resolved. This morning she was feeling well without complaints of chest pain, headaches or dizziness. Informed her that we are making some additional adjustments to her blood pressure medications and then she will be able to go home later today.   Objective:  Vital signs in last 24 hours: Vitals:   05/25/18 0014 05/25/18 0416 05/25/18 0622 05/25/18 0838  BP: (!) 147/91 (!) 170/58 (!) 168/87 (!) 163/75  Pulse: 68 67  68  Resp:  19  19  Temp: 97.6 F (36.4 C) 98 F (36.7 C)  97.7 F (36.5 C)  TempSrc: Oral Oral  Oral  SpO2: 96% 95%  96%  Weight:  65.5 kg    Height:       General: awake, alert, lying in bed in NAD CV: RRR; no murmurs, rubs or gallops Abd: soft, non-tender, non-distended Ext: no edema   Assessment/Plan:  Principal Problem:   Acute encephalopathy Active Problems:   Essential hypertension   Cerebral thrombosis with cerebral infarction   Cerebrovascular accident (CVA) (HCC)   Thrombocytopenia (HCC)   Alcohol abuse, daily use   AKI (acute kidney injury) ([place])  1. Acute encephalopathy:  - secondary to hypertensive emergency; patient had systolic blood pressure in 220s on arrival - blood pressure remains elevated but within goal of 140-180. Should continue to improve over the next few days as oral medications take effect. Continue Amlodipine 10 mg and Bystolic 10 mg. Increase Hydralazine to 50 mg TID.  - metabolic etiologies including infectious and electrolyte abnormalities have been ruled out - EEG did not show any epileptic activity   2. CVA: in the setting of hypertensive emergency  - MRI with small punctate foci; MRA head and neck unremarkable - 2D echo revealed LVEF 55-60% with mild LVH; no wall motion abnormalities; Grade 1 diastolic dysfunction;  mild left atrial dilation - neuro did not recommend further embolic work-up given uncontrolled HTN being most likely etiology  - She will be continued on aspirin and high intensity statin - working on blood pressure control as above   3. Essential HTN:  - has not been on any medications for quite some time per daughter - have initiated regimen which should be monitored closely outpatient and titrated as indicated  - medication options somewhat limited due to renal function; may be able to initiate thiazide if renal function improves  - she is establishing care at [facility] but cannot be seen until 06/14/18; will have her follow-up in [facility] next week for repeat blood pressure and medication adjustments    4. AKI on CKD III - likely multifactorial due to poor PO intake and hypertensive emergency - creatinine remains stable after IVF  - do not have records for what her renal function has been over the last several months; may be natural progression of CKD due to uncontrolled HTN - renal function should continue to be monitored periodically outpatient - recheck BMP at follow-up next week   5. Likely Dementia - daughter notes intermittent memory issues at home as well as visual hallucinations  - may be vascular given longstanding uncontrolled blood pressure versus Alzheimer's   Dispo: Anticipated discharge home today.   Modena Nunnery D, DO 05/25/2018, 9:37 AM Pager: [phone]

## 2018-05-27 LAB — CULTURE, BLOOD (ROUTINE X 2)
Culture: NO GROWTH
Culture: NO GROWTH

## 2018-05-31 ENCOUNTER — Ambulatory Visit: Payer: Medicare Other

## 2018-06-01 DIAGNOSIS — N390 Urinary tract infection, site not specified: Secondary | ICD-10-CM | POA: Diagnosis not present

## 2018-06-04 ENCOUNTER — Other Ambulatory Visit: Payer: Self-pay | Admitting: Internal Medicine

## 2018-06-04 DIAGNOSIS — Z1231 Encounter for screening mammogram for malignant neoplasm of breast: Secondary | ICD-10-CM

## 2018-06-05 ENCOUNTER — Emergency Department (HOSPITAL_BASED_OUTPATIENT_CLINIC_OR_DEPARTMENT_OTHER): Payer: Medicare Other

## 2018-06-05 ENCOUNTER — Encounter (HOSPITAL_COMMUNITY): Payer: Self-pay

## 2018-06-05 ENCOUNTER — Emergency Department (HOSPITAL_COMMUNITY): Payer: Medicare Other

## 2018-06-05 ENCOUNTER — Inpatient Hospital Stay (HOSPITAL_COMMUNITY)
Admission: EM | Admit: 2018-06-05 | Discharge: 2018-06-14 | DRG: 193 | Disposition: A | Discharge status: Home Health | Payer: Medicare Other | Attending: Oncology | Admitting: Oncology

## 2018-06-05 ENCOUNTER — Other Ambulatory Visit: Payer: Self-pay

## 2018-06-05 DIAGNOSIS — H409 Unspecified glaucoma: Secondary | ICD-10-CM | POA: Diagnosis present

## 2018-06-05 DIAGNOSIS — Z7982 Long term (current) use of aspirin: Secondary | ICD-10-CM

## 2018-06-05 DIAGNOSIS — I10 Essential (primary) hypertension: Secondary | ICD-10-CM | POA: Diagnosis present

## 2018-06-05 DIAGNOSIS — Z853 Personal history of malignant neoplasm of breast: Secondary | ICD-10-CM | POA: Diagnosis not present

## 2018-06-05 DIAGNOSIS — Z9049 Acquired absence of other specified parts of digestive tract: Secondary | ICD-10-CM | POA: Diagnosis not present

## 2018-06-05 DIAGNOSIS — I5021 Acute systolic (congestive) heart failure: Secondary | ICD-10-CM

## 2018-06-05 DIAGNOSIS — Z87891 Personal history of nicotine dependence: Secondary | ICD-10-CM | POA: Diagnosis not present

## 2018-06-05 DIAGNOSIS — Z8673 Personal history of transient ischemic attack (TIA), and cerebral infarction without residual deficits: Secondary | ICD-10-CM | POA: Diagnosis not present

## 2018-06-05 DIAGNOSIS — J90 Pleural effusion, not elsewhere classified: Secondary | ICD-10-CM

## 2018-06-05 DIAGNOSIS — I130 Hypertensive heart and chronic kidney disease with heart failure and stage 1 through stage 4 chronic kidney disease, or unspecified chronic kidney disease: Secondary | ICD-10-CM | POA: Diagnosis present

## 2018-06-05 DIAGNOSIS — J189 Pneumonia, unspecified organism: Secondary | ICD-10-CM | POA: Diagnosis not present

## 2018-06-05 DIAGNOSIS — F0150 Vascular dementia without behavioral disturbance: Secondary | ICD-10-CM | POA: Diagnosis present

## 2018-06-05 DIAGNOSIS — N183 Chronic kidney disease, stage 3 (moderate): Secondary | ICD-10-CM | POA: Diagnosis present

## 2018-06-05 DIAGNOSIS — Z882 Allergy status to sulfonamides status: Secondary | ICD-10-CM | POA: Diagnosis not present

## 2018-06-05 DIAGNOSIS — Z820 Family history of epilepsy and other diseases of the nervous system: Secondary | ICD-10-CM | POA: Diagnosis not present

## 2018-06-05 DIAGNOSIS — R05 Cough: Secondary | ICD-10-CM | POA: Diagnosis not present

## 2018-06-05 DIAGNOSIS — Z9851 Tubal ligation status: Secondary | ICD-10-CM

## 2018-06-05 DIAGNOSIS — E872 Acidosis: Secondary | ICD-10-CM | POA: Diagnosis present

## 2018-06-05 DIAGNOSIS — G9341 Metabolic encephalopathy: Secondary | ICD-10-CM | POA: Diagnosis present

## 2018-06-05 DIAGNOSIS — J9601 Acute respiratory failure with hypoxia: Secondary | ICD-10-CM | POA: Diagnosis not present

## 2018-06-05 DIAGNOSIS — R0602 Shortness of breath: Secondary | ICD-10-CM | POA: Diagnosis not present

## 2018-06-05 DIAGNOSIS — I129 Hypertensive chronic kidney disease with stage 1 through stage 4 chronic kidney disease, or unspecified chronic kidney disease: Secondary | ICD-10-CM | POA: Diagnosis not present

## 2018-06-05 DIAGNOSIS — R609 Edema, unspecified: Secondary | ICD-10-CM

## 2018-06-05 DIAGNOSIS — Z923 Personal history of irradiation: Secondary | ICD-10-CM | POA: Diagnosis not present

## 2018-06-05 DIAGNOSIS — Z79899 Other long term (current) drug therapy: Secondary | ICD-10-CM | POA: Diagnosis not present

## 2018-06-05 DIAGNOSIS — I82532 Chronic embolism and thrombosis of left popliteal vein: Secondary | ICD-10-CM | POA: Diagnosis present

## 2018-06-05 DIAGNOSIS — F05 Delirium due to known physiological condition: Secondary | ICD-10-CM | POA: Diagnosis present

## 2018-06-05 DIAGNOSIS — J9621 Acute and chronic respiratory failure with hypoxia: Secondary | ICD-10-CM | POA: Diagnosis present

## 2018-06-05 DIAGNOSIS — J439 Emphysema, unspecified: Secondary | ICD-10-CM | POA: Diagnosis present

## 2018-06-05 DIAGNOSIS — R410 Disorientation, unspecified: Secondary | ICD-10-CM

## 2018-06-05 DIAGNOSIS — I5033 Acute on chronic diastolic (congestive) heart failure: Secondary | ICD-10-CM | POA: Diagnosis present

## 2018-06-05 DIAGNOSIS — F0151 Vascular dementia with behavioral disturbance: Secondary | ICD-10-CM | POA: Diagnosis present

## 2018-06-05 DIAGNOSIS — I69319 Unspecified symptoms and signs involving cognitive functions following cerebral infarction: Secondary | ICD-10-CM | POA: Diagnosis not present

## 2018-06-05 DIAGNOSIS — R451 Restlessness and agitation: Secondary | ICD-10-CM | POA: Diagnosis not present

## 2018-06-05 DIAGNOSIS — Z881 Allergy status to other antibiotic agents status: Secondary | ICD-10-CM | POA: Diagnosis not present

## 2018-06-05 DIAGNOSIS — Z7901 Long term (current) use of anticoagulants: Secondary | ICD-10-CM | POA: Diagnosis not present

## 2018-06-05 LAB — BRAIN NATRIURETIC PEPTIDE: B NATRIURETIC PEPTIDE 5: 995 pg/mL — AB (ref 0.0–100.0)

## 2018-06-05 LAB — COMPREHENSIVE METABOLIC PANEL
ALT: 98 U/L — ABNORMAL HIGH (ref 0–44)
AST: 70 U/L — ABNORMAL HIGH (ref 15–41)
Albumin: 3.4 g/dL — ABNORMAL LOW (ref 3.5–5.0)
Alkaline Phosphatase: 119 U/L (ref 38–126)
Anion gap: 10 (ref 5–15)
BUN: 30 mg/dL — ABNORMAL HIGH (ref 8–23)
CO2: 19 mmol/L — ABNORMAL LOW (ref 22–32)
Calcium: 9.1 mg/dL (ref 8.9–10.3)
Chloride: 112 mmol/L — ABNORMAL HIGH (ref 98–111)
Creatinine, Ser: 1.42 mg/dL — ABNORMAL HIGH (ref 0.44–1.00)
GFR calc Af Amer: 38 mL/min — ABNORMAL LOW (ref >60)
GFR calc non Af Amer: 33 mL/min — ABNORMAL LOW (ref >60)
Glucose, Bld: 180 mg/dL — ABNORMAL HIGH (ref 70–99)
Potassium: 4.7 mmol/L (ref 3.5–5.1)
SODIUM: 141 mmol/L (ref 135–145)
Total Bilirubin: 0.8 mg/dL (ref 0.3–1.2)
Total Protein: 6.8 g/dL (ref 6.5–8.1)

## 2018-06-05 LAB — LACTIC ACID, PLASMA: Lactic Acid, Venous: 1.2 mmol/L (ref 0.5–1.9)

## 2018-06-05 LAB — CBC WITH DIFFERENTIAL/PLATELET
Abs Immature Granulocytes: 0.1 10*3/uL — ABNORMAL HIGH (ref 0.00–0.07)
Basophils Absolute: 0.1 10*3/uL (ref 0.0–0.1)
Basophils Relative: 0 %
EOS PCT: 0 %
Eosinophils Absolute: 0 10*3/uL (ref 0.0–0.5)
HCT: 34 % — ABNORMAL LOW (ref 36.0–46.0)
Hemoglobin: 10.6 g/dL — ABNORMAL LOW (ref 12.0–15.0)
Immature Granulocytes: 1 %
Lymphocytes Relative: 4 %
Lymphs Abs: 0.7 10*3/uL (ref 0.7–4.0)
MCH: 30.4 pg (ref 26.0–34.0)
MCHC: 31.2 g/dL (ref 30.0–36.0)
MCV: 97.4 fL (ref 80.0–100.0)
Monocytes Absolute: 0.9 10*3/uL (ref 0.1–1.0)
Monocytes Relative: 6 %
Neutro Abs: 14 10*3/uL — ABNORMAL HIGH (ref 1.7–7.7)
Neutrophils Relative %: 89 %
Platelets: 170 10*3/uL (ref 150–400)
RBC: 3.49 MIL/uL — ABNORMAL LOW (ref 3.87–5.11)
RDW: 12.8 % (ref 11.5–15.5)
WBC: 15.8 10*3/uL — AB (ref 4.0–10.5)
nRBC: 0 % (ref 0.0–0.2)

## 2018-06-05 LAB — PROTIME-INR
INR: 1.2
Prothrombin Time: 15 seconds (ref 11.4–15.2)

## 2018-06-05 LAB — I-STAT TROPONIN, ED: Troponin i, poc: 0.02 ng/mL (ref 0.00–0.08)

## 2018-06-05 MED ORDER — IOPAMIDOL (ISOVUE-370) INJECTION 76%
100.0000 mL | Freq: Once | INTRAVENOUS | Status: AC | PRN
  Administered 2018-06-05: 52 mL via INTRAVENOUS

## 2018-06-05 MED ORDER — AZITHROMYCIN 250 MG PO TABS
250.0000 mg | ORAL_TABLET | Freq: Every day | ORAL | Status: AC
  Administered 2018-06-07 – 2018-06-10 (×4): 250 mg via ORAL
  Filled 2018-06-05 (×4): qty 1

## 2018-06-05 MED ORDER — ACETAMINOPHEN 325 MG PO TABS
650.0000 mg | ORAL_TABLET | Freq: Four times a day (QID) | ORAL | Status: DC | PRN
  Administered 2018-06-10: 650 mg via ORAL
  Filled 2018-06-05: qty 2

## 2018-06-05 MED ORDER — AMLODIPINE BESYLATE 10 MG PO TABS
10.0000 mg | ORAL_TABLET | Freq: Every day | ORAL | Status: DC
  Administered 2018-06-06 – 2018-06-14 (×9): 10 mg via ORAL
  Filled 2018-06-05 (×9): qty 1

## 2018-06-05 MED ORDER — PRAVASTATIN SODIUM 10 MG PO TABS
20.0000 mg | ORAL_TABLET | Freq: Every day | ORAL | Status: DC
  Administered 2018-06-05 – 2018-06-13 (×7): 20 mg via ORAL
  Filled 2018-06-05 (×8): qty 2

## 2018-06-05 MED ORDER — ONDANSETRON HCL 4 MG PO TABS: 4.0000 mg | ORAL_TABLET | Freq: Four times a day (QID) | ORAL | Status: DC | PRN

## 2018-06-05 MED ORDER — HYDRALAZINE HCL 50 MG PO TABS
50.0000 mg | ORAL_TABLET | Freq: Three times a day (TID) | ORAL | Status: DC
  Administered 2018-06-05 – 2018-06-14 (×25): 50 mg via ORAL
  Filled 2018-06-05 (×26): qty 1

## 2018-06-05 MED ORDER — AZITHROMYCIN 250 MG PO TABS
500.0000 mg | ORAL_TABLET | Freq: Every day | ORAL | Status: AC
  Administered 2018-06-06: 500 mg via ORAL
  Filled 2018-06-05: qty 2

## 2018-06-05 MED ORDER — ENOXAPARIN SODIUM 80 MG/0.8ML ~~LOC~~ SOLN
65.0000 mg | SUBCUTANEOUS | Status: DC
  Administered 2018-06-05 – 2018-06-06 (×2): 65 mg via SUBCUTANEOUS
  Filled 2018-06-05 (×2): qty 0.8

## 2018-06-05 MED ORDER — LEVOFLOXACIN IN D5W 500 MG/100ML IV SOLN
500.0000 mg | Freq: Once | INTRAVENOUS | Status: AC
  Administered 2018-06-05: 500 mg via INTRAVENOUS
  Filled 2018-06-05: qty 100

## 2018-06-05 MED ORDER — ACETAMINOPHEN 650 MG RE SUPP: 650.0000 mg | Freq: Four times a day (QID) | RECTAL | Status: DC | PRN

## 2018-06-05 MED ORDER — ONDANSETRON HCL 4 MG/2ML IJ SOLN: 4.0000 mg | Freq: Four times a day (QID) | INTRAMUSCULAR | Status: DC | PRN

## 2018-06-05 MED ORDER — ASPIRIN EC 81 MG PO TBEC
81.0000 mg | DELAYED_RELEASE_TABLET | Freq: Every day | ORAL | Status: DC
  Administered 2018-06-06 – 2018-06-14 (×9): 81 mg via ORAL
  Filled 2018-06-05 (×9): qty 1

## 2018-06-05 MED ORDER — SODIUM CHLORIDE 0.9 % IV SOLN
1.0000 g | INTRAVENOUS | Status: DC
  Administered 2018-06-05 – 2018-06-10 (×6): 1 g via INTRAVENOUS
  Filled 2018-06-05 (×5): qty 10
  Filled 2018-06-05: qty 1

## 2018-06-05 MED ORDER — IPRATROPIUM-ALBUTEROL 0.5-2.5 (3) MG/3ML IN SOLN
3.0000 mL | Freq: Once | RESPIRATORY_TRACT | Status: AC
  Administered 2018-06-05: 3 mL via RESPIRATORY_TRACT
  Filled 2018-06-05: qty 3

## 2018-06-05 MED ORDER — NEBIVOLOL HCL 10 MG PO TABS
10.0000 mg | ORAL_TABLET | Freq: Every day | ORAL | Status: DC
  Administered 2018-06-06 – 2018-06-14 (×9): 10 mg via ORAL
  Filled 2018-06-05 (×9): qty 1

## 2018-06-05 MED ORDER — ENOXAPARIN SODIUM 40 MG/0.4ML ~~LOC~~ SOLN: 40.0000 mg | SUBCUTANEOUS | Status: DC

## 2018-06-05 NOTE — ED Notes (Signed)
Date and time results received: 06/05/18  (use smartphrase ".now" to insert current time)  Test: BNP Critical Value: 995  Name of Provider Notified: Sedonia Small

## 2018-06-05 NOTE — H&P (Signed)
Date: 06/05/2018               Patient Name:  Rhonda Sharp MRN: [id number]  DOB: [birth year] Age / Sex: 85 y.o., female   PCP: Tisovec, Fransico Him, MD         Medical Service: Internal Medicine Teaching Service         Attending Physician: Dr. Lucious Groves, DO    First Contact: Dr. Koleen Distance Pager: [phone]  Second Contact: Dr. Frederico Hamman Pager: [phone]       After Hours (After 5p/  First Contact Pager: [phone]  weekends / holidays): Second Contact Pager: [phone]   Chief Complaint: SOB  History of Present Illness: Rhonda Sharp is an 85 year old female with hypertension prior CVA who presented to the emergency department with progressive shortness of breath of three days duration. Of note the patient was recently hospitalized from 05/22/2018 to 05/25/2018 for hypertensive emergency with encephalopathy, CVA, and AKI.   Since the patient returned home on 1/17 she has been feeling well; however, on Sunday, 1/26, she was noted to have worsening shortness of breath with minor activities around the house. This subsequently progressed to shortness of breath with any type of ambulation or activity including getting dressed. They contacted the patient's home health nurse who evaluated the patient and at the time noted clear breath sounds and told her she was likely suffering from allergies. She encouraged her to wait it out. The following morning, 1/28, when she woke up she was noted to be extremely fatigued and dyspneic at rest. Her daughter subsequently brought her to the emergency department for further evaluation. In addition to the symptoms noted above she is also been experiencing intermittent productive cough. She states that the sputum has been a clear to yellow in color. She also endorses lower extremity edema, left worse than right. She denies any fevers, chills, headaches, abdominal pain, chest pain, diarrhea, arthralgias, myalgias, orthopnea, or sick contact. She does have a  significant past smoking history but denies a diagnosis of structural lung disease such as COPD or asthma. She is never needed oxygen at home.   She was evaluated by her PCP before the onset of her dyspnea and told that she may possibly have a UTI and she was started on cephalexin. She has been on this antibiotic for three days now. Of note she denies dysuria, increased urinary frequency, or urinary retention.  In the emergency department she was noted to be afebrile but to With an oxygen saturation of approximately 85% on room air. She was subsequently placed on 3 L nasal cannula and this improved to 93%. Initial labs were significant for non-anion gap metabolic acidosis, elevated AST and ALT, elevated BNP of 995, and a leukocytosis with left shift. Chest x-ray was obtained that showed a new left lower lobe capacity. Subsequent CTA illustrated a left lingula infiltrate. Lower extremity venous Doppler's were significant for chronic DVT involving the left popliteal vein.  Meds:  Current Meds  Medication Sig  . amLODipine (NORVASC) 10 MG tablet Take 1 tablet (10 mg total) by mouth daily.  Marland Kitchen aspirin 81 MG tablet Take 81 mg by mouth daily.  . cephALEXin (KEFLEX) 250 MG capsule Take 250 capsules by mouth 3 (three) times daily.   . hydrALAZINE (APRESOLINE) 50 MG tablet Take 1 tablet (50 mg total) by mouth every 8 (eight) hours.  . nebivolol (BYSTOLIC) 10 MG tablet Take 10 mg by mouth daily.  . pravastatin (PRAVACHOL) 20 MG tablet Take  1 tablet (20 mg total) by mouth daily at 6 PM.   Allergies: Allergies as of 06/05/2018 - Review Complete 06/05/2018  Allergen Reaction Noted  . Sulfa antibiotics Other (See Comments) 05/22/2018   Past Medical History:  Diagnosis Date  . Breast cancer, left breast ([place]) ~ 2002   "had radiation"  . Glaucoma   . History of gout   . Hypertension   . Migraine    "used to get them; nothing recently" (05/22/2018)  . Retinal hemorrhage   . Stroke [facility]) 2001   daughter  denies residual on 05/22/2018   Family History  Problem Relation Age of Onset  . ALS Sister    Social History: Patient recently moved from [place] to [place] approximately one month ago. Previous smoker, 20 pack year history. Denies the use of illicit substances. Binge drinker. Has not consumed alcohol in the past month.  Review of Systems: A complete ROS was negative except as per HPI.   Physical Exam: Blood pressure (!) 168/83, pulse 70, temperature 97.8 F (36.6 C), temperature source Oral, resp. rate (!) 21, SpO2 93 %.   General: Well nourished elderly female in no acute distress HENT: Normocephalic, atraumatic, moist mucus membranes Pulm: Diminished air movement with crackles on the left base to the mid thorax  CV: RRR, no murmurs, no rubs  Abdomen: Active bowel sounds, soft, non-distended, no tenderness to palpation  Extremities: Pulses palpable in all extremities, bilateral non-pitting edema, left > right  Skin: Warm and dry  Neuro: Alert and oriented x 3  EKG: personally reviewed: my interpretation is sinus rhythm with normal axis and intervals. No conduction abnormalities noted. Unchanged compared to prior.  CXR: personally reviewed: my interpretation is increased pulmonary vascular congestion with cephalization. Atelectasis of the right lung base. Silhouetting of the cardiac boarder and left hemidiaphragm.  Assessment & Plan by Problem: Active Problems:   Acute respiratory failure with hypoxia (HCC)  Rhonda Sharp is an 85 year old female with hypertension prior CVA who presented to the emergency department with acute hypoxic respiratory failure in the setting of a new pulmonary infiltrate concerning for pneumonia. She was subsequently admitted for further evaluation and management.  Acute hypoxic respiratory failure Community acquired pneumonia - Patient presenting with progressive shortness of breath, cough, focal pulmonary exam, and leukocytosis with left  shift. These findings are consistent with pneumonia. Although chest x-ray does show some pulmonary congestion in her BNP is elevated I do not suspect that she is having an overt heart failure exacerbation. - Will start the patient on ceftriaxone and azithromycin for community acquired pneumonia. Although she was recently hospitalized she does not have any other risk factors for multi-drug-resistant organisms. - Admit to MedSurg with continuous pulse ox  - Wean patient to room air  Chronic left popliteal DVT - LE doppler with findings consistent with chronic deep vein thrombosis involving the left popliteal vein. No cystic structure found in the popliteal fossa. - Start on Lovenox  - Plan to transition to PO anticoagulant on DC  Hypertension - Continue amlodipine 10 mg QD, Hydralazine 50 mg TID, Nebivolol 10 mg QD  Prior CVA - Continue Pravastatin 20 mg QD  Diet: Regular  VTE ppx: Lovenox CODE STATUS: Full code  Dispo: Admit patient to Inpatient with expected length of stay greater than 2 midnights.  Signed: Ina Homes, MD 06/05/2018, 6:54 PM

## 2018-06-05 NOTE — ED Provider Notes (Signed)
[facility] Emergency Department Provider Note MRN:  [id number]  Arrival date & time: 06/06/18     Chief Complaint   Shortness of Breath   History of Present Illness   Rhonda Sharp is a 85 y.o. year-old female with a history of stroke presenting to the ED with chief complaint of cough, shortness of breath.  Progressively worsening shortness of breath and cough for the past 3 days.  Recent admission to the hospital for hypertensive emergency and stroke.  Family noticed swelling to the left leg 2 days ago.  Patient denies chest pain, no headache or vision change, no abdominal pain.  Symptoms are constant, no exacerbating relieving factors.  Symptoms are moderate to severe.  Review of Systems  A complete 10 system review of systems was obtained and all systems are negative except as noted in the HPI and PMH.   Patient's Health History    Past Medical History:  Diagnosis Date  . Breast cancer, left breast ([place]) ~ 2002   "had radiation"  . Glaucoma   . History of gout   . Hypertension   . Migraine    "used to get them; nothing recently" (05/22/2018)  . Retinal hemorrhage   . Stroke [facility]) 2001   daughter denies residual on 05/22/2018    Past Surgical History:  Procedure Laterality Date  . APPENDECTOMY    . AXILLARY LYMPH NODE DISSECTION Left ~ 2002  . BREAST BIOPSY Left ~ 2002  . EYE SURGERY Left 05/2018   "laser OR for bleed"  . TUBAL LIGATION      Family History  Problem Relation Age of Onset  . ALS Sister     Social History   Socioeconomic History  . Marital status: Widowed    Spouse name: Not on file  . Number of children: Not on file  . Years of education: Not on file  . Highest education level: Not on file  Occupational History  . Not on file  Social Needs  . Financial resource strain: Not on file  . Food insecurity:    Worry: Not on file    Inability: Not on file  . Transportation needs:    Medical: Not on file    Non-medical: Not  on file  Tobacco Use  . Smoking status: Former Smoker    Packs/day: 1.00    Years: 20.00    Pack years: 20.00    Types: Cigarettes  . Smokeless tobacco: Never Used  . Tobacco comment: 05/22/2018 "quit smoking in the 1990s"  Substance and Sexual Activity  . Alcohol use: Yes    Alcohol/week: 6.0 standard drinks    Types: 6 Shots of liquor per week    Comment: 05/22/2018 "straight vodka"  . Drug use: Never  . Sexual activity: Not on file  Lifestyle  . Physical activity:    Days per week: Not on file    Minutes per session: Not on file  . Stress: Not on file  Relationships  . Social connections:    Talks on phone: Not on file    Gets together: Not on file    Attends religious service: Not on file    Active member of club or organization: Not on file    Attends meetings of clubs or organizations: Not on file    Relationship status: Not on file  . Intimate partner violence:    Fear of current or ex partner: Not on file    Emotionally abused: Not on file  Physically abused: Not on file    Forced sexual activity: Not on file  Other Topics Concern  . Not on file  Social History Narrative  . Not on file     Physical Exam  Vital Signs and Nursing Notes reviewed Vitals:   06/06/18 0307 06/06/18 0600  BP:  (!) 150/78  Pulse: 76   Resp: (!) 29   Temp:    SpO2: 94%     CONSTITUTIONAL: Chronically ill-appearing, NAD NEURO:  Alert and oriented x 3, no focal deficits EYES:  eyes equal and reactive ENT/NECK:  no LAD, no JVD CARDIO: Regular rate, well-perfused, normal S1 and S2 PULM: Scattered wheezing GI/GU:  normal bowel sounds, non-distended, non-tender MSK/SPINE:  No gross deformities, 2+ pitting edema left lower extremity SKIN:  no rash, atraumatic PSYCH:  Appropriate speech and behavior  Diagnostic and Interventional Summary    EKG Interpretation  Date/Time:  Tuesday June 05 2018 10:03:40 EST Ventricular Rate:  71 PR Interval:  176 QRS Duration: 76 QT  Interval:  412 QTC Calculation: 447 R Axis:   88 Text Interpretation:  Normal sinus rhythm with sinus arrhythmia Nonspecific T wave abnormality Abnormal ECG Confirmed by Gerlene Fee [phone]) on 06/05/2018 11:36:55 AM      Labs Reviewed  COMPREHENSIVE METABOLIC PANEL - Abnormal; Notable for the following components:      Result Value   Chloride 112 (*)    CO2 19 (*)    Glucose, Bld 180 (*)    BUN 30 (*)    Creatinine, Ser 1.42 (*)    Albumin 3.4 (*)    AST 70 (*)    ALT 98 (*)    GFR calc non Af Amer 33 (*)    GFR calc Af Amer 38 (*)    All other components within normal limits  CBC WITH DIFFERENTIAL/PLATELET - Abnormal; Notable for the following components:   WBC 15.8 (*)    RBC 3.49 (*)    Hemoglobin 10.6 (*)    HCT 34.0 (*)    Neutro Abs 14.0 (*)    Abs Immature Granulocytes 0.10 (*)    All other components within normal limits  BRAIN NATRIURETIC PEPTIDE - Abnormal; Notable for the following components:   B Natriuretic Peptide 995.0 (*)    All other components within normal limits  BASIC METABOLIC PANEL - Abnormal; Notable for the following components:   Chloride 112 (*)    CO2 18 (*)    Glucose, Bld 109 (*)    BUN 32 (*)    Creatinine, Ser 1.35 (*)    GFR calc non Af Amer 35 (*)    GFR calc Af Amer 41 (*)    All other components within normal limits  CBC - Abnormal; Notable for the following components:   WBC 13.8 (*)    RBC 3.20 (*)    Hemoglobin 10.1 (*)    HCT 30.8 (*)    All other components within normal limits  CULTURE, BLOOD (ROUTINE X 2)  CULTURE, BLOOD (ROUTINE X 2)  LACTIC ACID, PLASMA  PROTIME-INR  MAGNESIUM  I-STAT TROPONIN, ED    CT ANGIO CHEST PE W OR WO CONTRAST  Final Result    VAS [place] LOWER EXTREMITY VENOUS (DVT) (ONLY [facility] & [facility])  Final Result    DG Chest 2 View  Final Result      Medications  aspirin EC tablet 81 mg (has no administration in time range)  amLODipine (NORVASC) tablet 10 mg (has no administration in  time range)    hydrALAZINE (APRESOLINE) tablet 50 mg (50 mg Oral Given 06/06/18 0642)  nebivolol (BYSTOLIC) tablet 10 mg (has no administration in time range)  pravastatin (PRAVACHOL) tablet 20 mg (20 mg Oral Given 06/05/18 2113)  acetaminophen (TYLENOL) tablet 650 mg (has no administration in time range)    Or  acetaminophen (TYLENOL) suppository 650 mg (has no administration in time range)  ondansetron (ZOFRAN) tablet 4 mg (has no administration in time range)    Or  ondansetron (ZOFRAN) injection 4 mg (has no administration in time range)  cefTRIAXone (ROCEPHIN) 1 g in sodium chloride 0.9 % 100 mL IVPB (1 g Intravenous New Bag/Given 06/05/18 2055)  azithromycin (ZITHROMAX) tablet 500 mg (has no administration in time range)    Followed by  azithromycin (ZITHROMAX) tablet 250 mg (has no administration in time range)  enoxaparin (LOVENOX) injection 65 mg (65 mg Subcutaneous Given 06/05/18 2240)  ipratropium-albuterol (DUONEB) 0.5-2.5 (3) MG/3ML nebulizer solution 3 mL (3 mLs Nebulization Given 06/06/18 0816)  ipratropium-albuterol (DUONEB) 0.5-2.5 (3) MG/3ML nebulizer solution (  Not Given 06/06/18 0320)  chlorhexidine (PERIDEX) 0.12 % solution 15 mL (has no administration in time range)  MEDLINE mouth rinse (has no administration in time range)  ipratropium-albuterol (DUONEB) 0.5-2.5 (3) MG/3ML nebulizer solution 3 mL (3 mLs Nebulization Given 06/05/18 1319)  levofloxacin (LEVAQUIN) IVPB 500 mg (0 mg Intravenous Stopped 06/05/18 1638)  iopamidol (ISOVUE-370) 76 % injection 100 mL (52 mLs Intravenous Contrast Given 06/05/18 1518)     Procedures Critical Care Critical Care Documentation Critical care time provided by me (excluding procedures): 35 minutes  Condition necessitating critical care: Hypoxic respiratory failure  Components of critical care management: reviewing of prior records, laboratory and imaging interpretation, frequent re-examination and reassessment of vital signs, administration of IV  antibiotics, supplemental oxygen, discussion with consulting services.   ED Course and Medical Decision Making  I have reviewed the triage vital signs and the nursing notes.  Pertinent labs & imaging results that were available during my care of the patient were reviewed by me and considered in my medical decision making (see below for details).  Concern for hospital acquired pneumonia versus pulmonary embolism versus pulmonary edema in this 85 year old female with a new oxygen requirement.  Seems to be doing well on 3 L nasal cannula, recent hypertensive emergency blood pressures are under control at this time.  No focal neurological deficits.  Work-up pending.  CTA unremarkable, favoring pneumonia versus pulmonary edema.  Admitted to hospital service for further care.  Barth Kirks. [place] Small, [place] [email or web address]  Final Clinical Impressions(s) / ED Diagnoses     ICD-10-CM   1. Acute respiratory failure with hypoxia [facility]) J96.01     ED Discharge Orders    None         Maudie Flakes, MD 06/06/18 [phone]

## 2018-06-05 NOTE — Progress Notes (Signed)
Paged by RN regarding sob and increased wob after patient got to the floor and transferred beds. Patient was evaluated at bedside and was using accessory muscles to breath but did not appear to be in acute distress. RT was also at bedside and placed her on high flow 10L [place]. Ms. Alridge denies using home O2 or home inhalers. She denies chest pain.   HR 70, RR 28, O2 95% on HFNC (dropped to 85% when sitting up) Pulm: using accessory muscles to breath, speaking full sentences without difficulty, diminished breath sounds at bilateral bases, diffuse crackles midway up posterior left lung Cardiac: RRR, no m/r/g, no JVD, no pitting edema in lower extremities   A/P: 89yoF admitted today with acute hypoxic respiratory failure and imaging revealing a left lingular pneumonia. Subsequent workup revealed left lower extremity DVT, CTA was negative for PE. She was placed on supplemental O2 and given antibiotics. On my exam, she does appear to have increased work of breathing, but O2 saturations are in the mid 90s on HFNC and she has normal mentation. Lung ascultation is unchanged from admission. She does not appear volume overloaded and had a recent echo with EF 55-60%, g1dd.  - appreciate RT's assistance  - she does not appear to be in acute respiratory distress at this time - will place bipap order prn    Francesco Runner, MD Internal Medicine, PGY1 Pager: [phone] 06/05/2018, 9:07 PM

## 2018-06-05 NOTE — ED Triage Notes (Signed)
Pt endorses cough with shob x 3 days. Spo2 85% on RA in triage. Tachypneic. Placed on 3L [place] and sat up to 92%

## 2018-06-05 NOTE — Progress Notes (Signed)
ANTICOAGULATION CONSULT NOTE - Initial Consult  Pharmacy Consult for Lovenox Indication: DVT   Vital Signs: Temp: 97.8 F (36.6 C) (01/28 1008) Temp Source: Oral (01/28 1008) BP: 168/83 (01/28 1830) Pulse Rate: 88 (01/28 2054)  Labs: Recent Labs    06/05/18 1019  HGB 10.6*  HCT 34.0*  PLT 170  LABPROT 15.0  INR 1.20  CREATININE 1.42*     Medical History: Past Medical History:  Diagnosis Date  . Breast cancer, left breast ([place]) ~ 2002   "had radiation"  . Glaucoma   . History of gout   . Hypertension   . Migraine    "used to get them; nothing recently" (05/22/2018)  . Retinal hemorrhage   . Stroke [facility]) 2001   daughter denies residual on 05/22/2018     Assessment: DVT in L lower extremity Patient is not on any anticoagulation prior to admission. SCr 1.4, eCrCl 25-30 ml/min. Starting anticoagulation  Goal of Therapy:  Anti-Xa level 0.6-1 units/ml 4hrs after LMWH dose given Monitor platelets by anticoagulation protocol: Yes    Plan:  -Lovenox 65 mg [place] q24h -F/u plan for long term anticoagulation   Carleen Rhue, Jake Church 06/05/2018,9:14 PM

## 2018-06-05 NOTE — Progress Notes (Signed)
LLE venous duplex       has been completed. Preliminary results can be found under CV proc through chart review. Eola Waldrep, BS, RDMS, RVT    

## 2018-06-05 NOTE — Progress Notes (Signed)
Patient SOB and  using accessory muscles to breath. Sats 90% on 4L.Marland Kitchen Upon assessment patient was wheezing and had some fine crackles. Text paged MD.

## 2018-06-05 NOTE — ED Notes (Signed)
Pt. Transported to CT 

## 2018-06-06 DIAGNOSIS — R451 Restlessness and agitation: Secondary | ICD-10-CM

## 2018-06-06 DIAGNOSIS — Z79899 Other long term (current) drug therapy: Secondary | ICD-10-CM

## 2018-06-06 DIAGNOSIS — J9601 Acute respiratory failure with hypoxia: Secondary | ICD-10-CM

## 2018-06-06 DIAGNOSIS — J189 Pneumonia, unspecified organism: Secondary | ICD-10-CM | POA: Diagnosis present

## 2018-06-06 DIAGNOSIS — Z881 Allergy status to other antibiotic agents status: Secondary | ICD-10-CM

## 2018-06-06 DIAGNOSIS — G9341 Metabolic encephalopathy: Secondary | ICD-10-CM

## 2018-06-06 DIAGNOSIS — Z87891 Personal history of nicotine dependence: Secondary | ICD-10-CM

## 2018-06-06 DIAGNOSIS — I82532 Chronic embolism and thrombosis of left popliteal vein: Secondary | ICD-10-CM

## 2018-06-06 DIAGNOSIS — Z8673 Personal history of transient ischemic attack (TIA), and cerebral infarction without residual deficits: Secondary | ICD-10-CM

## 2018-06-06 DIAGNOSIS — I10 Essential (primary) hypertension: Secondary | ICD-10-CM

## 2018-06-06 LAB — BASIC METABOLIC PANEL
Anion gap: 11 (ref 5–15)
BUN: 32 mg/dL — ABNORMAL HIGH (ref 8–23)
CO2: 18 mmol/L — ABNORMAL LOW (ref 22–32)
CREATININE: 1.35 mg/dL — AB (ref 0.44–1.00)
Calcium: 9.2 mg/dL (ref 8.9–10.3)
Chloride: 112 mmol/L — ABNORMAL HIGH (ref 98–111)
GFR calc Af Amer: 41 mL/min — ABNORMAL LOW (ref >60)
GFR calc non Af Amer: 35 mL/min — ABNORMAL LOW (ref >60)
Glucose, Bld: 109 mg/dL — ABNORMAL HIGH (ref 70–99)
Potassium: 4.3 mmol/L (ref 3.5–5.1)
Sodium: 141 mmol/L (ref 135–145)

## 2018-06-06 LAB — CBC
HCT: 30.8 % — ABNORMAL LOW (ref 36.0–46.0)
Hemoglobin: 10.1 g/dL — ABNORMAL LOW (ref 12.0–15.0)
MCH: 31.6 pg (ref 26.0–34.0)
MCHC: 32.8 g/dL (ref 30.0–36.0)
MCV: 96.3 fL (ref 80.0–100.0)
Platelets: 153 10*3/uL (ref 150–400)
RBC: 3.2 MIL/uL — ABNORMAL LOW (ref 3.87–5.11)
RDW: 12.6 % (ref 11.5–15.5)
WBC: 13.8 10*3/uL — ABNORMAL HIGH (ref 4.0–10.5)
nRBC: 0 % (ref 0.0–0.2)

## 2018-06-06 LAB — MAGNESIUM: Magnesium: 1.9 mg/dL (ref 1.7–2.4)

## 2018-06-06 MED ORDER — RAMELTEON 8 MG PO TABS
8.0000 mg | ORAL_TABLET | Freq: Every day | ORAL | Status: DC
  Administered 2018-06-07 – 2018-06-11 (×5): 8 mg via ORAL
  Filled 2018-06-06 (×7): qty 1

## 2018-06-06 MED ORDER — IPRATROPIUM-ALBUTEROL 0.5-2.5 (3) MG/3ML IN SOLN
3.0000 mL | Freq: Four times a day (QID) | RESPIRATORY_TRACT | Status: DC
  Administered 2018-06-06 (×4): 3 mL via RESPIRATORY_TRACT
  Filled 2018-06-06 (×3): qty 3

## 2018-06-06 MED ORDER — IPRATROPIUM-ALBUTEROL 0.5-2.5 (3) MG/3ML IN SOLN
RESPIRATORY_TRACT | Status: AC
  Filled 2018-06-06: qty 3

## 2018-06-06 MED ORDER — ORAL CARE MOUTH RINSE
15.0000 mL | Freq: Two times a day (BID) | OROMUCOSAL | Status: DC
  Administered 2018-06-07 – 2018-06-13 (×9): 15 mL via OROMUCOSAL

## 2018-06-06 MED ORDER — CHLORHEXIDINE GLUCONATE 0.12 % MT SOLN
15.0000 mL | Freq: Two times a day (BID) | OROMUCOSAL | Status: DC
  Administered 2018-06-06 – 2018-06-14 (×17): 15 mL via OROMUCOSAL
  Filled 2018-06-06 (×16): qty 15

## 2018-06-06 NOTE — Progress Notes (Signed)
   Subjective: Rhonda Sharp was seen and evaluated at bedside. Overnight, she was placed on BiPAP due to increased work of breathing.  This morning when we saw her she had been transitioned off of BiPAP to nasal cannula. She was confused with disordered thinking and accusatory delusions. Daughter is at bedside and states this is fairly common for her when she comes to the hospital.   Objective:  Vital signs in last 24 hours: Vitals:   06/05/18 1830 06/05/18 2054 06/05/18 2344 06/06/18 0307  BP: (!) 168/83  (!) 150/88   Pulse: 70 88 70 76  Resp: (!) 21 (!) 22 18 (!) 29  Temp:   97.9 F (36.6 C)   TempSrc:   Oral   SpO2: 93% 93% 95% 94%   General: awake and alert but quite disoriented with tangential thought processes and accusatory delusions  Pulm: normal work of breathing; speaking/yelling in full sentences, mild left basilar crackles   Assessment/Plan:  Active Problems:   Acute respiratory failure with hypoxia (HCC)  Rhonda Sharp is an 85 year old female with hypertension prior CVA who presented to the emergency department with acute hypoxic respiratory failure in the setting of a new pulmonary infiltrate concerning for pneumonia. She was subsequently admitted for further evaluation and management.  Acute hypoxic respiratory failure Community acquired pneumonia - respiratory status worsened overnight requiring BiPAP; now stable on HFNC  - remains afebrile - continue ceftriaxone and azithromycin  - blood cx pending  - would like to continue pulse ox monitoring, but may not be able to tolerate due to agitation  - BNP noted to be elevated; echo from 1/16 was unremarkable; will continue to monitor volume status, but suspect this BNP elevation is in the setting of demand from acute illness  Chronic left popliteal DVT - LE doppler with findings consistent with chronic deep vein thrombosis involving the left popliteal vein - continue Lovenox and will transition to DOAC at  discharge   Hypertension - blood pressure stable on home regimen of amlodipine 10 mg QD, Hydralazine 50 mg TID, Nebivolol 10 mg QD  Acute metabolic encephalopathy on suspected underlying dementia - will continue delirium precautions - may require sitter if daughter leaves bedside; for now remains redirectable  Dispo: Anticipated discharge pending improvement in respiratory status.   Modena Nunnery D, DO 06/06/2018, 6:45 AM Pager: [phone]

## 2018-06-06 NOTE — Progress Notes (Signed)
[place] held at 2230. Patient sleeping comfortably in bed. While sleeping, O2 dropped to 82% on room air. Oxygen weaned from 10L HFNC to 5L HFNC. Currently O2 is 94%.    English as a second language teacher

## 2018-06-06 NOTE — Progress Notes (Signed)
Call received from [place] patient had 2.60 pause. MD made aware

## 2018-06-06 NOTE — Progress Notes (Signed)
Placed patient on BIPAP at this time for WOB, as documented RCP will continue to follow.

## 2018-06-06 NOTE — Progress Notes (Signed)
Patient confused and agitated 15 mins after taking her off BIPAP. Patient told her daughter " you and your husband tricked me and brought me the hospital, she also asked me why she is in the hospital".Patient oriented x2 at the moment .Patient reoriented . Sats 94% on HFNC. Dr Donne Hazel aware of the slight confusion this morning. Endorsed to day shift RN to follow up.

## 2018-06-06 NOTE — Care Management (Signed)
#    5.   S/W  [facility]  @   [place] #  [phone]  ELIQUIS  10 MG : NONE FORMULARY  1. ELIQUIS  2.5 MG BID COVER- YES CO-PAY- $ 10.00 TIER-  DRUG  PRIOR APPROVAL- NO  2. ELIQUIS  5 MG BID  COVER- YES CO-PAY - $ 10.00 TIER- 3 DRUG PRIOR APPROVAL- NO  3. XARELTO 15 MG BID COVER- YES CO-PAY- $ 10.00 TIER- 3 DRUG PRIOR APPROVAL- NO  4. XARELTO  20 MG DAILY COVER- YES CO-PAY-  $ 10.00 TIER- 3 DRUG PRIOR APPROVAL- NO  PREFERRED PHARMACY : YES -  [facility]

## 2018-06-07 DIAGNOSIS — I129 Hypertensive chronic kidney disease with stage 1 through stage 4 chronic kidney disease, or unspecified chronic kidney disease: Secondary | ICD-10-CM

## 2018-06-07 DIAGNOSIS — N183 Chronic kidney disease, stage 3 (moderate): Secondary | ICD-10-CM

## 2018-06-07 LAB — BASIC METABOLIC PANEL
Anion gap: 10 (ref 5–15)
BUN: 42 mg/dL — AB (ref 8–23)
CO2: 19 mmol/L — ABNORMAL LOW (ref 22–32)
Calcium: 8.7 mg/dL — ABNORMAL LOW (ref 8.9–10.3)
Chloride: 111 mmol/L (ref 98–111)
Creatinine, Ser: 1.54 mg/dL — ABNORMAL HIGH (ref 0.44–1.00)
GFR calc Af Amer: 35 mL/min — ABNORMAL LOW (ref >60)
GFR calc non Af Amer: 30 mL/min — ABNORMAL LOW (ref >60)
Glucose, Bld: 101 mg/dL — ABNORMAL HIGH (ref 70–99)
Potassium: 4.1 mmol/L (ref 3.5–5.1)
Sodium: 140 mmol/L (ref 135–145)

## 2018-06-07 LAB — CBC
HCT: 28.6 % — ABNORMAL LOW (ref 36.0–46.0)
Hemoglobin: 9.2 g/dL — ABNORMAL LOW (ref 12.0–15.0)
MCH: 31.1 pg (ref 26.0–34.0)
MCHC: 32.2 g/dL (ref 30.0–36.0)
MCV: 96.6 fL (ref 80.0–100.0)
Platelets: 141 10*3/uL — ABNORMAL LOW (ref 150–400)
RBC: 2.96 MIL/uL — ABNORMAL LOW (ref 3.87–5.11)
RDW: 12.6 % (ref 11.5–15.5)
WBC: 8.8 10*3/uL (ref 4.0–10.5)
nRBC: 0 % (ref 0.0–0.2)

## 2018-06-07 MED ORDER — ENOXAPARIN SODIUM 80 MG/0.8ML ~~LOC~~ SOLN
1.0000 mg/kg | SUBCUTANEOUS | Status: DC
  Administered 2018-06-07 – 2018-06-10 (×4): 70 mg via SUBCUTANEOUS
  Filled 2018-06-07 (×4): qty 0.8

## 2018-06-07 MED ORDER — IPRATROPIUM-ALBUTEROL 0.5-2.5 (3) MG/3ML IN SOLN
3.0000 mL | Freq: Three times a day (TID) | RESPIRATORY_TRACT | Status: DC
  Administered 2018-06-07 – 2018-06-09 (×7): 3 mL via RESPIRATORY_TRACT
  Filled 2018-06-07 (×7): qty 3

## 2018-06-07 NOTE — Progress Notes (Signed)
   Subjective: Rhonda Sharp was seen and evaluated at bedside this morning. No acute events overnight. She was resting comfortably on our examination. Felt her breathing was better. Denies chest pain or abdominal pain.   Objective:  Vital signs in last 24 hours: Vitals:   06/07/18 0800 06/07/18 0831 06/07/18 0900 06/07/18 1100  BP:  133/61    Pulse:  75  73  Resp:  20    Temp:  97.8 F (36.6 C)    TempSrc:  Oral    SpO2: 94% 94% 94% 93%  Weight:      Height:       General: elderly female, resting comfortably in bed in NAD CV: RRR Pulm: no increased work of breathing; saturating on 6L [place]; lungs with mild crackles at left base   Assessment/Plan:  Principal Problem:   Acute respiratory failure with hypoxia (HCC) Active Problems:   Essential hypertension   CAP (community acquired pneumonia)   Chronic deep vein thrombosis (DVT) of popliteal vein of left lower extremity [facility])  MarcellaCriscioneis an 85 year old female with hypertension prior CVA who presented to the emergency department withacute hypoxic respiratory failure in the setting of a new pulmonary infiltrate concerning for pneumonia. She was subsequently admitted for furtherevaluation and management.  Acute hypoxic respiratory failure Community acquired pneumonia - respiratory status improving; she is now on 4-6L [place]; will continue to wean as tolerated  - leukocytosis improving, and she remains afebrile - continue IV ceftriaxone and azithromycin   - blood cx NGTD    Chronic left popliteal DVT - LE doppler with findings consistent with chronic deep vein thrombosis involving the left popliteal vein - will decide on best oral anticoagulation prior to discharge given her impaired renal function; would like to avoid warfarin if possible   Hypertension - blood pressure stable on home regimen of amlodipine 10 mg QD, Hydralazine 50 mg TID, Nebivolol 10 mg QD  CKD III - renal function remains stable - will continue  to monitor daily   Acute metabolic encephalopathy on suspected underlying dementia - patient was less agitated today  - continue delirium precautions - will order bedside sitter or low dose Seroquel if needed   Dispo: Anticipated discharge pending improvement in respiratory status.   Rhonda Sharp D, DO 06/07/2018, 11:23 AM Pager: [phone]

## 2018-06-07 NOTE — Progress Notes (Signed)
ANTICOAGULATION CONSULT NOTE - Follow Up Consult  Pharmacy Consult for Lovenox Indication: LLE DVT chronic -found this admit 06/05/18  Allergies  Allergen Reactions  . Sulfa Antibiotics Other (See Comments)    Unknown reaction to pt    Patient Measurements: Height: 5\' 7"  (170.2 cm) Weight: 156 lb 8.4 oz (71 kg) IBW/kg (Calculated) : 61.6 Dosing weight for lovenox =71 kg  Vital Signs: Temp: 97.8 F (36.6 C) (01/30 0831) Temp Source: Oral (01/30 0831) BP: 133/61 (01/30 0831) Pulse Rate: 75 (01/30 0831)  Labs: Recent Labs    06/05/18 1019 06/06/18 0305 06/07/18 0311  HGB 10.6* 10.1* 9.2*  HCT 34.0* 30.8* 28.6*  PLT 170 153 141*  LABPROT 15.0  --   --   INR 1.20  --   --   CREATININE 1.42* 1.35* 1.54*    Estimated Creatinine Clearance: 25 mL/min (A) (by C-G formula based on SCr of 1.54 mg/dL (H)).   Assessment: 85 y.o female with LLE chronic DVT found on this admit 06/05/18 per LE venous doppler.  No bleeding noted. Scr 1.7>1.42>1.35>1.54,  CrCl 25 ml/min , thus lovenox dosing adjusted to 1mg /kg every 24 hour interval for DVT. Weight is 71 kg this admission.  pltc 170>>141k.  Hgb 10.6>>9.2.  No bleeding reported.  Goal of Therapy:  Anti-Xa level 0.6-1 units/ml 4hrs after LMWH dose given Monitor platelets by anticoagulation protocol: Yes   Plan:  Increase Lovenox dose to 70 mg sq q24h  Monitor for signs/sympoms of bleeding -F/u plan for long term anticoagulation plan  Nicole Cella, [place] Pharmacist [phone] Please check AMION for all [place] phone numbers After 10:00 PM, call [place] [phone] 06/07/2018,10:26 AM

## 2018-06-08 LAB — BASIC METABOLIC PANEL
Anion gap: 7 (ref 5–15)
BUN: 46 mg/dL — ABNORMAL HIGH (ref 8–23)
CHLORIDE: 116 mmol/L — AB (ref 98–111)
CO2: 20 mmol/L — ABNORMAL LOW (ref 22–32)
CREATININE: 1.71 mg/dL — AB (ref 0.44–1.00)
Calcium: 8.7 mg/dL — ABNORMAL LOW (ref 8.9–10.3)
GFR calc Af Amer: 31 mL/min — ABNORMAL LOW (ref >60)
GFR calc non Af Amer: 26 mL/min — ABNORMAL LOW (ref >60)
Glucose, Bld: 117 mg/dL — ABNORMAL HIGH (ref 70–99)
Potassium: 4.2 mmol/L (ref 3.5–5.1)
Sodium: 143 mmol/L (ref 135–145)

## 2018-06-08 LAB — CBC
HCT: 29.1 % — ABNORMAL LOW (ref 36.0–46.0)
HEMOGLOBIN: 9.1 g/dL — AB (ref 12.0–15.0)
MCH: 30.1 pg (ref 26.0–34.0)
MCHC: 31.3 g/dL (ref 30.0–36.0)
MCV: 96.4 fL (ref 80.0–100.0)
Platelets: 161 10*3/uL (ref 150–400)
RBC: 3.02 MIL/uL — ABNORMAL LOW (ref 3.87–5.11)
RDW: 12.5 % (ref 11.5–15.5)
WBC: 8 10*3/uL (ref 4.0–10.5)
nRBC: 0 % (ref 0.0–0.2)

## 2018-06-08 MED ORDER — LACTATED RINGERS IV SOLN
INTRAVENOUS | Status: DC
  Administered 2018-06-08: 07:00:00 via INTRAVENOUS

## 2018-06-08 NOTE — Care Management Important Message (Signed)
Important Message  Patient Details  Name: Rhonda Sharp MRN: [id number] Date of Birth: [birth year]   Medicare Important Message Given:       Orbie Pyo 06/08/2018, 2:24 PM

## 2018-06-08 NOTE — Telephone Encounter (Signed)
Pt  canceled 1/23 HFU appt at via automated reminder system and was admitted back into the hospital on

## 2018-06-08 NOTE — Progress Notes (Signed)
   Subjective: Rhonda Sharp was evaluated at bedside. No acute events overnight. She reports continued productive cough but does believe that her SOB has improved. She has a good appetite and is overall feeling better this morning.   Objective:  Vital signs in last 24 hours: Vitals:   06/08/18 0300 06/08/18 0624 06/08/18 0739 06/08/18 0806  BP:  132/62  (!) 126/59  Pulse:  63 72 67  Resp:   16   Temp:    98.1 F (36.7 C)  TempSrc:    Oral  SpO2:   94% 94%  Weight: 68 kg     Height:       General: awake, alert, pleasantly demented woman lying in bed in NAD CV: RRR; no murmurs, rubs or gallops Pulm: no increased work of breathing; saturating on 6L; lungs with good air movement in anterior fields   Assessment/Plan:  Principal Problem:   Acute respiratory failure with hypoxia (HCC) Active Problems:   Essential hypertension   CAP (community acquired pneumonia)   Chronic deep vein thrombosis (DVT) of popliteal vein of left lower extremity [facility])  MarcellaCriscioneis an 85 year old female with hypertension prior CVA who presented to the emergency department withacute hypoxic respiratory failure in the setting of a new pulmonary infiltrate concerning for pneumonia. She was subsequently admitted for furtherevaluation and management.  Acute hypoxic respiratory failure Community acquired pneumonia - patient clinically improving but still on 3-6 L [place]; suspect she will continue to improve over the next day or 2; wean as tolerated  - leukocytosis resolved, and she remains afebrile - continue IV ceftriaxone and azithromycin   - add Mucinex for cough  - blood cx NGTD   Chronic left popliteal DVT - LE doppler with findings consistent with chronic deep vein thrombosis involving the left popliteal vein - plan on transitioning to Eliquis at discharge   Hypertension -blood pressure stable on home regimen ofamlodipine 10 mg QD, Hydralazine 50 mg TID, Nebivolol 10 mg QD  CKD  III - renal function trended up slightly - unclear how much she's taking PO - will give LR at 75 ml/hr and will recheck tomorrow  Acute metabolic encephalopathy on suspected underlying dementia - patient's mental status much improved  - continue delirium precautions - will order bedside sitter or low dose Seroquel if needed   Dispo: Anticipated discharge in approximately 2-3 day(s).   Rhonda Sharp D, DO 06/08/2018, 12:47 PM Pager: [phone]

## 2018-06-09 LAB — BASIC METABOLIC PANEL
Anion gap: 8 (ref 5–15)
BUN: 41 mg/dL — ABNORMAL HIGH (ref 8–23)
CO2: 23 mmol/L (ref 22–32)
Calcium: 8.8 mg/dL — ABNORMAL LOW (ref 8.9–10.3)
Chloride: 111 mmol/L (ref 98–111)
Creatinine, Ser: 1.62 mg/dL — ABNORMAL HIGH (ref 0.44–1.00)
GFR calc Af Amer: 33 mL/min — ABNORMAL LOW (ref >60)
GFR calc non Af Amer: 28 mL/min — ABNORMAL LOW (ref >60)
Glucose, Bld: 124 mg/dL — ABNORMAL HIGH (ref 70–99)
Potassium: 4.1 mmol/L (ref 3.5–5.1)
Sodium: 142 mmol/L (ref 135–145)

## 2018-06-09 LAB — CBC
HCT: 29.2 % — ABNORMAL LOW (ref 36.0–46.0)
Hemoglobin: 9.3 g/dL — ABNORMAL LOW (ref 12.0–15.0)
MCH: 31 pg (ref 26.0–34.0)
MCHC: 31.8 g/dL (ref 30.0–36.0)
MCV: 97.3 fL (ref 80.0–100.0)
Platelets: 173 10*3/uL (ref 150–400)
RBC: 3 MIL/uL — AB (ref 3.87–5.11)
RDW: 12.5 % (ref 11.5–15.5)
WBC: 8.9 10*3/uL (ref 4.0–10.5)
nRBC: 0 % (ref 0.0–0.2)

## 2018-06-09 MED ORDER — SODIUM CHLORIDE 0.9% FLUSH
10.0000 mL | Freq: Two times a day (BID) | INTRAVENOUS | Status: DC
  Administered 2018-06-09 – 2018-06-14 (×8): 10 mL

## 2018-06-09 MED ORDER — SODIUM CHLORIDE 0.9% FLUSH
10.0000 mL | INTRAVENOUS | Status: DC | PRN
  Administered 2018-06-12: 10 mL
  Filled 2018-06-09: qty 40

## 2018-06-09 MED ORDER — IPRATROPIUM-ALBUTEROL 0.5-2.5 (3) MG/3ML IN SOLN
3.0000 mL | Freq: Two times a day (BID) | RESPIRATORY_TRACT | Status: DC
  Administered 2018-06-09 – 2018-06-10 (×2): 3 mL via RESPIRATORY_TRACT
  Filled 2018-06-09 (×2): qty 3

## 2018-06-09 NOTE — Progress Notes (Addendum)
   Subjective: Rhonda Sharp was seen at bedside this AM. She states her breathing is feeling better today. She has been weaned to 5L overnight. She is feeling good with no questions or complaints at this time.  Objective:  Vital signs in last 24 hours: Vitals:   06/08/18 1957 06/08/18 2300 06/09/18 0746 06/09/18 0757  BP:  139/64 136/85   Pulse:  69 66   Resp:  17    Temp:  97.9 F (36.6 C) (!) 97.4 F (36.3 C)   TempSrc:  Oral Oral   SpO2: 95% 95% 92% 94%  Weight:      Height:       Physical Exam Constitutional:      General: She is not in acute distress. Cardiovascular:     Rate and Rhythm: Normal rate and regular rhythm.     Heart sounds: Normal heart sounds.  Pulmonary:     Effort: Pulmonary effort is normal. No respiratory distress.     Comments: Trace Crackles posterior fields Abdominal:     General: Bowel sounds are normal.     Palpations: Abdomen is soft.  Musculoskeletal:     Right lower leg: No edema.     Left lower leg: No edema.  Skin:    General: Skin is warm and dry.  Neurological:     General: No focal deficit present.     Mental Status: She is alert. Mental status is at baseline.    Assessment/Plan:  Principal Problem:   Acute respiratory failure with hypoxia (HCC) Active Problems:   Essential hypertension   CAP (community acquired pneumonia)   Chronic deep vein thrombosis (DVT) of popliteal vein of left lower extremity [facility])  MarcellaCriscioneis an 85 year old female with hypertension prior CVA who presented to the emergency department withacute hypoxic respiratory failure in the setting of a new pulmonary infiltrate concerning for pneumonia. She was subsequently admitted for furtherevaluation and management.  Acute hypoxic respiratory failure Community acquired pneumonia: Improving clinically, Leukocytosis resolved; remains on 3-6 L [place]; will wean as tolerated. Blood Cx NGTD  - IV ceftriaxone and azithromycin  (Day 4/5) - Mucinex for  cough  Chronic left popliteal DVT: LE doppler with findings consistent with chronic deep vein thrombosis involving the left popliteal vein - Continue Lovenox - Will transition to Eliquis 5mg  BID at discharge   Hypertension: Stable on home regimen. - Amlodipine 10mg  Daily, Hydralazine 50mg  TID, Nebivolol 10mg  Daily  CKD III: Cr baseline s/p LR 55ml/hr x 24h. Possible decreased PO intake. - Continue to monitor - AM BMP  Acute metabolic encephalopathy on suspected underlying dementia - Mental status remains improved  - Continue delirium precautions - PRN Seroquel - Will order Sitter if needed   Dispo: Anticipated discharge in approximately 1-2 day(s).   Neva Seat, MD 06/09/2018, 9:26 AM

## 2018-06-09 NOTE — Progress Notes (Signed)
Internal Medicine Attending:   I saw and examined the patient. I reviewed the resident's note and I agree with the resident's findings and plan as documented in the resident's note.  Patient states that she feels well today and has no new complaints.  Still requiring 5 L nasal cannula with O2 sats in the 90s.  Patient is initially made to the hospital with acute hypoxic respiratory failure likely secondary to community-acquired pneumonia.  Her leukocytosis is resolved.  We will continue with IV ceftriaxone and azithromycin to complete a 5-day course.  We will attempt to wean her oxygen down slowly.  On the left her room he had lowered her oxygen to 4 L nasal cannula and instructed the nurse to wean as tolerated.  I suspect that the patient will likely need home O2 on discharge but her oxygen requirement is currently too high for me to feel comfortable sending her home today.  We will continue to monitor her closely and have her O2 requirements improve we will likely discharge her home either tomorrow or on Monday.

## 2018-06-10 ENCOUNTER — Inpatient Hospital Stay (HOSPITAL_COMMUNITY): Payer: Medicare Other

## 2018-06-10 LAB — BASIC METABOLIC PANEL
Anion gap: 13 (ref 5–15)
BUN: 33 mg/dL — ABNORMAL HIGH (ref 8–23)
CO2: 21 mmol/L — ABNORMAL LOW (ref 22–32)
Calcium: 9.1 mg/dL (ref 8.9–10.3)
Chloride: 109 mmol/L (ref 98–111)
Creatinine, Ser: 1.37 mg/dL — ABNORMAL HIGH (ref 0.44–1.00)
GFR calc Af Amer: 40 mL/min — ABNORMAL LOW (ref >60)
GFR calc non Af Amer: 35 mL/min — ABNORMAL LOW (ref >60)
Glucose, Bld: 193 mg/dL — ABNORMAL HIGH (ref 70–99)
Potassium: 4.1 mmol/L (ref 3.5–5.1)
Sodium: 143 mmol/L (ref 135–145)

## 2018-06-10 LAB — CULTURE, BLOOD (ROUTINE X 2)
Culture: NO GROWTH
Culture: NO GROWTH

## 2018-06-10 MED ORDER — FUROSEMIDE 10 MG/ML IJ SOLN
20.0000 mg | Freq: Once | INTRAMUSCULAR | Status: AC
  Administered 2018-06-10: 20 mg via INTRAVENOUS
  Filled 2018-06-10: qty 2

## 2018-06-10 NOTE — Progress Notes (Signed)
   Subjective: Alert and oriented x 3. Feeling very tired this morning. She was able to get some sleep last night but her breathing feels worse this morning. She continues to cough up pale mucous. She is laying flat in bed and appears uncomfortable from a respiratory standpoint. She just received a breathing treatment and is now on 5L [place] (had been weaned to 3L yesterday). Her breathing feels better when she sits up. She denies any history of heart issues or leg swelling. She has been able to walk in the room but feels very tired afterwards. Last BM was 4 days ago. Empathized with her slow progress. Discussed plan for one more dose of antibiotcs today and continue walking with RN/PT.   Objective:  Vital signs in last 24 hours: Vitals:   06/09/18 2339 06/10/18 0547 06/10/18 0750 06/10/18 0753  BP:  (!) 124/101 (!) 142/103   Pulse: 65  63   Resp: 16  15   Temp: 97.9 F (36.6 C)     TempSrc: Oral     SpO2: 91%  92% 93%  Weight:      Height:       Gen: laying in bed, uncomfortable appearing but NAD Pulm: 5L [place], taking deep quick breaths, bibasilar crackles, diffuse crackles on the entire left posterior lung field. Abd: soft, NT, ND  Ext: warm, no LEE  Assessment/Plan:  Principal Problem:   Acute respiratory failure with hypoxia (HCC) Active Problems:   Essential hypertension   CAP (community acquired pneumonia)   Chronic deep vein thrombosis (DVT) of popliteal vein of left lower extremity ([place])  Ms. Vanderploeg is an 63yoF with HTN, prior CVA who presented with acute hypoxic respiratory failure, productive cough, leukocytosis, and new pulmonary infiltrate on CXR and CT concerning for pneumonia. She is being treated with IV antibiotics and has been slow to improve.  1. CAP, Acute hypoxic respiratory failure: Worse this morning. Had been weaned to 3L [place] but is now back up to 5L. Endorses worsening sob and fatigue, stable productive cough, no hemoptysis. Remains afebrile without leukocytosis.  Has received 4 days of IV antibiotics. Slow progress has been frustrating. Considering other diagnosis but echo this admission did not show significant heart failure (EF 55-60%, g1dd), CT did not show any significant lymphadenopathy or mass that would be concerning for malignancy  - continue IV ceftriaxone, azithromycin. Day 5/5 - BID duonebs - mucinex  - repeat CXR, if increased pulmonary edema, will consider one time dose of lasix   2. Chronic DVT, left popliteal:  - Continue lovenox  - d/c with bid eliquis 5mg   3. HTN: largely well controlled, readings fluctuate due to pain - continue home amlodipine, hydralazine, nebivolol   4. CKD3: stable, avoid nephrotoxic meds.   5. Acute metabolic encephalopathy, dementia: no further episodes of delirium. Alert and oriented x 3 today.  - continue delirium precautions - can order sitter if needed - if not redirectable, can give low dose Seroquel   Dispo: Anticipated discharge in approximately 1-2 day(s) pending improvement in respiratory status.   Isabelle Course, MD 06/10/2018, 8:47 AM Pager: [phone]

## 2018-06-10 NOTE — Progress Notes (Signed)
SATURATION QUALIFICATIONS: (This note is used to comply with regulatory documentation for home oxygen)  Patient Saturations on Room Air at Rest = 88%  Patient Saturations on Room Air while Ambulating = 83%  Patient Saturations on 6 Liters of oxygen while Ambulating = 92%  Please briefly explain why patient needs home oxygen:

## 2018-06-10 NOTE — Evaluation (Signed)
Occupational Therapy Evaluation Patient Details Name: Rhonda Sharp MRN: [id number] DOB: 09/24/1928 Today's Date: 06/10/2018    History of Present Illness Pt is an 85 y.o. female admitted 05/22/17 with AMS and hypertensive urgency. CXR without acute findings. Head CT negative for acute findings. MRI with small punctate foci. Per neurology, pt likely has an undiagnosed underlying dementia, with acute worsening secondary to hypertensive encephalopathy. PMH includes HTN, glaucoma.   Clinical Impression   Pt admitted with the above diagnoses and presents with below problem list. Pt will benefit from continued acute OT to address the below listed deficits and maximize independence with basic ADLs prior to d/c to venue below. At baseline, pt is mod I with ADLs, uses cane. Pt is currently min A for most ADLs. Fatigues quickly and with DOE 3/5.      Follow Up Recommendations  Home health OT;Supervision/Assistance - 24 hour    Equipment Recommendations  None recommended by OT    Recommendations for Other Services       Precautions / Restrictions Precautions Precautions: Fall Restrictions Weight Bearing Restrictions: No      Mobility Bed Mobility Overal bed mobility: Modified Independent                Transfers Overall transfer level: Needs assistance Equipment used: Rolling walker (2 wheeled) Transfers: Sit to/from Stand Sit to Stand: Min guard         General transfer comment: Pt asking for external support to stand from EOB    Balance Overall balance assessment: Needs assistance   Sitting balance-Leahy Scale: Good       Standing balance-Leahy Scale: Poor                             ADL either performed or assessed with clinical judgement   ADL Overall ADL's : Needs assistance/impaired Eating/Feeding: Set up   Grooming: Set up;Sitting   Upper Body Bathing: Minimal assistance   Lower Body Bathing: Minimal assistance;Sit to/from stand    Upper Body Dressing : Minimal assistance   Lower Body Dressing: Minimal assistance   Toilet Transfer: Min guard   Toileting- Clothing Manipulation and Hygiene: Minimal assistance   Tub/ Shower Transfer: Min guard   Functional mobility during ADLs: Min guard;Rolling walker General ADL Comments: Fatigues quickly. Walked to sink and around bed to recliner. Declined further OOB activity at this time.     Vision         Perception     Praxis      Pertinent Vitals/Pain Pain Assessment: Faces Faces Pain Scale: Hurts a little bit Pain Location: grimacing with bed mobility Pain Descriptors / Indicators: Grimacing Pain Intervention(s): Monitored during session     Hand Dominance     Extremity/Trunk Assessment Upper Extremity Assessment Upper Extremity Assessment: Generalized weakness   Lower Extremity Assessment Lower Extremity Assessment: Defer to PT evaluation   Cervical / Trunk Assessment Cervical / Trunk Assessment: Kyphotic   Communication     Cognition Arousal/Alertness: Awake/alert Behavior During Therapy: WFL for tasks assessed/performed Overall Cognitive Status: History of cognitive impairments - at baseline                                     General Comments       Exercises     Shoulder Instructions      Home Living Family/patient expects to be discharged  to:: Private residence Living Arrangements: Children;Other relatives Available Help at Discharge: Family;Available 24 hours/day Type of Home: House Home Access: Stairs to enter [employer] of Steps: 3 Entrance Stairs-Rails: None Home Layout: Two level;Bed/bath upstairs;1/2 bath on main level(laundry in 2nd floor; main floor bathroom being built with)     Bathroom Shower/Tub: Walk-in shower;Tub/shower unit   Bathroom Toilet: Standard     Home Equipment: Cane - single point   Additional Comments: Pt recently moved from [place] to live with daughter. Daughter reports she  is available for 24/7 assist      Prior Functioning/Environment Level of Independence: Independent with assistive device(s)        Comments: Pt reports indep with intermittent use of cane. Per daughter, pt always carries her cane, indep with ADLs, able to help with household tasks        OT Problem List: Impaired balance (sitting and/or standing);Decreased strength;Decreased activity tolerance;Decreased knowledge of use of DME or AE;Decreased knowledge of precautions;Cardiopulmonary status limiting activity      OT Treatment/Interventions: Self-care/ADL training;Therapeutic exercise;Energy conservation;Manual therapy;Therapeutic activities;Balance training    OT Goals(Current goals can be found in the care plan section) Acute Rehab OT Goals Patient Stated Goal: Want to go home OT Goal Formulation: With patient Time For Goal Achievement: 06/24/18 Potential to Achieve Goals: Fair ADL Goals Pt Will Perform Grooming: with modified independence;standing Pt Will Perform Upper Body Bathing: with modified independence;sitting Pt Will Perform Lower Body Bathing: with modified independence;sit to/from stand Pt Will Perform Upper Body Dressing: with modified independence;sitting Pt Will Perform Lower Body Dressing: with modified independence;sit to/from stand Pt Will Transfer to Toilet: with modified independence;ambulating Pt Will Perform Toileting - Clothing Manipulation and hygiene: with modified independence;sit to/from stand  OT Frequency: Min 2X/week   Barriers to D/C:            Co-evaluation              AM-PAC OT "6 Clicks" Daily Activity     Outcome Measure Help from another person eating meals?: A Little Help from another person taking care of personal grooming?: A Little Help from another person toileting, which includes using toliet, bedpan, or urinal?: A Little Help from another person bathing (including washing, rinsing, drying)?: A Little Help from another  person to put on and taking off regular upper body clothing?: A Little Help from another person to put on and taking off regular lower body clothing?: A Little 6 Click Score: 18   End of Session Equipment Utilized During Treatment: Rolling walker;Oxygen  Activity Tolerance: Patient limited by fatigue Patient left: in chair;with call bell/phone within reach;with chair alarm set  OT Visit Diagnosis: Unsteadiness on feet (R26.81);Muscle weakness (generalized) (M62.81)                Time: 2956-2130 OT Time Calculation (min): 18 min Charges:     Tyrone Schimke, OT Acute Rehabilitation Services Pager: [phone] Office: [phone]   Hortencia Pilar 06/10/2018, 12:46 PM

## 2018-06-10 NOTE — Progress Notes (Signed)
Physical Therapy Treatment Note  Clinical Impression: SATURATION QUALIFICATIONS: (This note is used to comply with regulatory documentation for home oxygen)  Patient Saturations on Room Air at Rest = 91%  Patient Saturations on Room Air while Ambulating = 86%  Patient Saturations on 4 Liters of oxygen while Ambulating = 92%  Please briefly explain why patient needs home oxygen: Patient requires supplemental oxygen to maintain oxygen saturations at acceptable, safe levels with physical activity.  Roney Marion, [place]  Acute Rehabilitation Services Pager [phone] Office [phone]

## 2018-06-10 NOTE — Evaluation (Signed)
Physical Therapy Evaluation Patient Details Name: Rhonda Sharp MRN: [id number] DOB: [birth year] Today's Date: 06/10/2018   History of Present Illness  Pt is an 85 y.o. female admitted 05/22/17 with AMS and hypertensive urgency. CXR without acute findings. Head CT negative for acute findings. MRI with small punctate foci. Per neurology, pt likely has an undiagnosed underlying dementia, with acute worsening secondary to hypertensive encephalopathy. PMH includes HTN, glaucoma.  Clinical Impression   Pt admitted with above diagnosis. Pt currently with functional limitations due to the deficits listed below (see PT Problem List). Prior to most recent admission approx 2 weeks ago, managing independently with cane; Presents to PT with decr funcitoanl capacity, decr functional mobility, decr activity tolerance;  Pt will benefit from skilled PT to increase their independence and safety with mobility to allow discharge to the venue listed below.       Follow Up Recommendations Home health PT;Supervision/Assistance - 24 hour    Equipment Recommendations  Rolling walker with 5" wheels;3in1 (PT)(I believe she already has RW)    Recommendations for Other Services       Precautions / Restrictions Precautions Precautions: Fall Restrictions Weight Bearing Restrictions: No      Mobility  Bed Mobility Overal bed mobility: Modified Independent                Transfers Overall transfer level: Needs assistance Equipment used: Rolling walker (2 wheeled) Transfers: Sit to/from Stand Sit to Stand: Min guard         General transfer comment: Pt asking for external support to stand from EOB  Ambulation/Gait Ambulation/Gait assistance: Min guard Gait Distance (Feet): 30 Feet Assistive device: Rolling walker (2 wheeled) Gait Pattern/deviations: Step-through pattern;Decreased stride length;Trunk flexed Gait velocity: Decreased   General Gait Details: Cues for deep breathing; O2 sats  decr with amb on Room Air, increased supplemental O2 to 4 L via [place]  Stairs            Wheelchair Mobility    Modified Rankin (Stroke Patients Only)       Balance Overall balance assessment: Needs assistance   Sitting balance-Leahy Scale: Good       Standing balance-Leahy Scale: Poor Standing balance comment: Can static stand and take steps without UE support                              Pertinent Vitals/Pain Pain Assessment: No/denies pain Faces Pain Scale: Hurts a little bit Pain Location: grimacing with bed mobility Pain Descriptors / Indicators: Grimacing Pain Intervention(s): Monitored during session    Home Living Family/patient expects to be discharged to:: Private residence Living Arrangements: Children;Other relatives Available Help at Discharge: Family;Available 24 hours/day Type of Home: House Home Access: Stairs to enter Entrance Stairs-Rails: None Entrance Stairs-Number of Steps: 3 Home Layout: Two level;Bed/bath upstairs;1/2 bath on main level(laundry in 2nd floor; main floor bathroom being built with) Home Equipment: Walker - 2 wheels Additional Comments: Pt recently moved from [place] to live with daughter. Daughter reports she is available for 24/7 assist    Prior Function Level of Independence: Independent with assistive device(s)         Comments: Pt reports indep with intermittent use of cane. Per daughter, pt always carries her cane, indep with ADLs, able to help with household tasks     Hand Dominance        Extremity/Trunk Assessment   Upper Extremity Assessment Upper Extremity Assessment: Defer to OT evaluation  Lower Extremity Assessment Lower Extremity Assessment: Generalized weakness    Cervical / Trunk Assessment Cervical / Trunk Assessment: Kyphotic  Communication   Communication: No difficulties  Cognition Arousal/Alertness: Awake/alert Behavior During Therapy: WFL for tasks assessed/performed Overall  Cognitive Status: History of cognitive impairments - at baseline                                        General Comments      Exercises     Assessment/Plan    PT Assessment Patient needs continued PT services  PT Problem List Decreased strength;Decreased activity tolerance;Decreased balance;Decreased mobility;Decreased cognition;Decreased safety awareness;Decreased knowledge of use of DME;Cardiopulmonary status limiting activity       PT Treatment Interventions DME instruction;Gait training;Stair training;Functional mobility training;Therapeutic activities;Therapeutic exercise;Balance training;Neuromuscular re-education;Cognitive remediation;Patient/family education    PT Goals (Current goals can be found in the Care Plan section)  Acute Rehab PT Goals Patient Stated Goal: Agreeable to amb PT Goal Formulation: With patient/family Time For Goal Achievement: 06/24/18 Potential to Achieve Goals: Good    Frequency Min 3X/week   Barriers to discharge        Co-evaluation               AM-PAC PT "6 Clicks" Mobility  Outcome Measure Help needed turning from your back to your side while in a flat bed without using bedrails?: None Help needed moving from lying on your back to sitting on the side of a flat bed without using bedrails?: None Help needed moving to and from a bed to a chair (including a wheelchair)?: None Help needed standing up from a chair using your arms (e.g., wheelchair or bedside chair)?: A Little Help needed to walk in hospital room?: A Little Help needed climbing 3-5 steps with a railing? : A Little 6 Click Score: 21    End of Session Equipment Utilized During Treatment: Gait belt;Oxygen Activity Tolerance: Patient tolerated treatment well Patient left: in chair;with call bell/phone within reach;with family/visitor present Nurse Communication: Mobility status PT Visit Diagnosis: Other abnormalities of gait and mobility  (R26.89);Unsteadiness on feet (R26.81)(decr functional capacity)    Time: 7902-4097 PT Time Calculation (min) (ACUTE ONLY): 23 min   Charges:   PT Evaluation $PT Eval Moderate Complexity: 1 Mod PT Treatments $Gait Training: 8-22 mins        Roney Marion, PT  Acute Rehabilitation Services Pager [phone] Office [phone]   Colletta [place] 06/10/2018, 3:44 PM

## 2018-06-10 NOTE — Progress Notes (Signed)
ANTICOAGULATION CONSULT NOTE - Follow Up Consult  Pharmacy Consult for Lovenox Indication: LLE DVT chronic -found this admit 06/05/18  Allergies  Allergen Reactions  . Sulfa Antibiotics Other (See Comments)    Unknown reaction to pt    Patient Measurements: Height: 5\' 7"  (170.2 cm) Weight: 149 lb 14.4 oz (68 kg) IBW/kg (Calculated) : 61.6 Dosing weight for lovenox =71 kg  Vital Signs: BP: 142/103 (02/02 0750) Pulse Rate: 63 (02/02 0750)  Labs: Recent Labs    06/08/18 0317 06/09/18 0308 06/10/18 0302  HGB 9.1* 9.3*  --   HCT 29.1* 29.2*  --   PLT 161 173  --   CREATININE 1.71* 1.62* 1.37*    Estimated Creatinine Clearance: 28.1 mL/min (A) (by C-G formula based on SCr of 1.37 mg/dL (H)).   Assessment: 85 y.o female with LLE chronic DVT found on this admit 06/05/18 per LE venous doppler.  Lovenox dosing is 1mg /kg every 24 hour interval for DVTwith CrCl < 30 ml/min. Weight is 68 kg-71kg this admission. Scr 1.71>1.62>1.37,  CrCl 28 ml/min  Hgb low stable at 9.3 and pltc wnl/stable. No bleeding reported.   Goal of Therapy:  Anti-Xa level 0.6-1 units/ml 4hrs after LMWH dose given Monitor platelets by anticoagulation protocol: Yes   Plan:  Continue  Lovenox  70 mg sq q24h  Monitor for signs/sympoms of bleeding -F/u plan for long term anticoagulation plan,  MD notes plan to discharge on Eliquis 5 mg BID.   Nicole Cella, RPh Clinical Pharmacist [phone] Please check AMION for all [facility] phone numbers After 10:00 PM, call [place] [phone] 06/10/2018,12:35 PM

## 2018-06-11 DIAGNOSIS — J90 Pleural effusion, not elsewhere classified: Secondary | ICD-10-CM

## 2018-06-11 LAB — BASIC METABOLIC PANEL
Anion gap: 9 (ref 5–15)
BUN: 33 mg/dL — ABNORMAL HIGH (ref 8–23)
CO2: 21 mmol/L — ABNORMAL LOW (ref 22–32)
CREATININE: 1.27 mg/dL — AB (ref 0.44–1.00)
Calcium: 8.4 mg/dL — ABNORMAL LOW (ref 8.9–10.3)
Chloride: 114 mmol/L — ABNORMAL HIGH (ref 98–111)
GFR calc Af Amer: 44 mL/min — ABNORMAL LOW (ref >60)
GFR calc non Af Amer: 38 mL/min — ABNORMAL LOW (ref >60)
Glucose, Bld: 109 mg/dL — ABNORMAL HIGH (ref 70–99)
Potassium: 4 mmol/L (ref 3.5–5.1)
SODIUM: 144 mmol/L (ref 135–145)

## 2018-06-11 MED ORDER — APIXABAN 5 MG PO TABS
5.0000 mg | ORAL_TABLET | Freq: Two times a day (BID) | ORAL | Status: DC
  Administered 2018-06-11 – 2018-06-14 (×7): 5 mg via ORAL
  Filled 2018-06-11 (×7): qty 1

## 2018-06-11 MED ORDER — APIXABAN 5 MG PO TABS: 5.0000 mg | ORAL_TABLET | Freq: Two times a day (BID) | ORAL | 5 refills | Status: DC

## 2018-06-11 MED ORDER — FUROSEMIDE 10 MG/ML IJ SOLN
20.0000 mg | Freq: Once | INTRAMUSCULAR | Status: AC
  Administered 2018-06-11: 20 mg via INTRAVENOUS
  Filled 2018-06-11: qty 2

## 2018-06-11 NOTE — Discharge Summary (Addendum)
Name: Rhonda Sharp MRN: [id number] DOB: 1929-02-26 85 y.o. PCP: Haywood Pao, MD  Date of Admission: 06/05/2018  9:56 AM Date of Discharge: 06/14/18 Attending Physician: Annia Belt, MD  Discharge Diagnosis: 1. Acute on chronic hypoxic respiratory failure 2/2 community acquired pneumonia 2. Chronic DVT, left popliteal  3. HTN 4. CKD3 5. Vascular dementia, Hospital delirium  6. Acute on chronic diastolic heart failure exacerbation  Discharge Medications: Allergies as of 06/14/2018      Reactions   Sulfa Antibiotics Other (See Comments)   Unknown reaction to pt      Medication List    STOP taking these medications   aspirin 81 MG tablet   cephALEXin 250 MG capsule Commonly known as:  KEFLEX     TAKE these medications   amLODipine 10 MG tablet Commonly known as:  NORVASC Take 1 tablet (10 mg total) by mouth daily.   apixaban 5 MG Tabs tablet Commonly known as:  ELIQUIS Take 1 tablet (5 mg total) by mouth 2 (two) times daily.   furosemide 20 MG tablet Commonly known as:  LASIX Take 1 tablet (20 mg total) by mouth every other day.   hydrALAZINE 50 MG tablet Commonly known as:  APRESOLINE Take 1 tablet (50 mg total) by mouth every 8 (eight) hours.   nebivolol 10 MG tablet Commonly known as:  BYSTOLIC Take 10 mg by mouth daily.   pravastatin 20 MG tablet Commonly known as:  PRAVACHOL Take 1 tablet (20 mg total) by mouth daily at 6 PM.            Durable Medical Equipment  (From admission, onward)         Start     Ordered   06/11/18 1156  DME Oxygen  Once    Question Answer Comment  Mode or (Route) Nasal cannula   Liters per Minute 4   Frequency Continuous (stationary and portable oxygen unit needed)   Oxygen conserving device No   Oxygen delivery system Gas      06/11/18 1156   06/11/18 1152  For home use only DME Walker rolling  [facility])  Once    Comments:  5 inch wheels  Question:  Patient needs a walker to treat with the  following condition  Answer:  Acute on chronic respiratory failure with hypoxia ([place])   06/11/18 1156   06/11/18 1151  DME 3-in-1  Once     06/11/18 1156          Disposition and follow-up:   Ms.Rhonda Sharp was discharged from [facility] in Good condition.  At the hospital follow up visit please address:  1.  She was started on lasix at discharge. Please assess volume status and lasix dosing.        Ensure compliance with Eliquis 5mg  BID for left DVT diagnosed during hospital admission  2.  Labs / imaging needed at time of follow-up: BMP  3.  Pending labs/ test needing follow-up: none   Follow-up Appointments: Follow-up Information    Care, [facility] Follow up.   Specialty:  Home Health Services Why:  HHRN,HHPT, [place] Contact information: [address] [place] [address] [phone]        Health, [facility] Follow up.   Specialty:  Home Health Services Why:  HHPT, [facility] , HHOT Contact information: [place] [address] [phone]        Tisovec, Fransico Him, MD. Schedule an appointment  as soon as possible for a visit in 1 week(s).   Specialty:  Internal Medicine Contact information: [place] [address] [place] Hospital Course by problem list: [name] is an 89yoF with HTN, prior CVA who presented with acute hypoxic respiratory failure, productive cough, leukocytosis, and new pulmonary infiltrate on CXR and CT concerning for pneumonia.   Acute on chronic hypoxic respiratory failure:  Community Acquired Pneumonia  Acute on chronic diastolic heart failure  Treated with 5 days of IV azithromycin and ceftriaxone. Fever and leukocytosis resolved but she remained sob with supplemental O2 requirement. Repeat CXR showed worsening pleural effusions L>R. Recent echo showed EF 55% and g1dd. No LEE or JVD but patient was given IV lasix and  respiratory status further improved. Renal function improved and remained stable with diuresis. She was saturating well on room air by time of discharge. And discharged on lasix 20mg  QOD.   DVT, left popliteal:  Presented with sob and physical exam revealed left lower extremity asymmetric swelling. Ultrasound showed chronic appearing DVT. CTA chest negative for PE. Discharged on eliquis for at least 6 months, f/u with PCP   Discharge Vitals:   BP 115/65   Pulse 63   Temp 97.8 F (36.6 C) (Oral)   Resp 12   Ht 5\' 7"  (1.702 m)   Wt 68.2 kg   SpO2 93%   BMI 23.55 kg/m   Pertinent Labs, Studies, and Procedures:   BMP Latest Ref Rng & Units 06/14/2018 06/13/2018 06/12/2018  Glucose 70 - 99 mg/dL 119(H) 139(H) 110(H)  BUN 8 - 23 mg/dL 35(H) 31(H) 34(H)  Creatinine 0.44 - 1.00 mg/dL 1.51(H) 1.51(H) 1.41(H)  Sodium 135 - 145 mmol/L 142 143 143  Potassium 3.5 - 5.1 mmol/L 4.0 4.0 4.1  Chloride 98 - 111 mmol/L 107 109 110  CO2 22 - 32 mmol/L 25 28 28   Calcium 8.9 - 10.3 mg/dL 8.9 8.5(L) 9.2   CBC Latest Ref Rng & Units 06/09/2018 06/08/2018 06/07/2018  WBC 4.0 - 10.5 K/uL 8.9 8.0 8.8  Hemoglobin 12.0 - 15.0 g/dL 9.3(L) 9.1(L) 9.2(L)  Hematocrit 36.0 - 46.0 % 29.2(L) 29.1(L) 28.6(L)  Platelets 150 - 400 K/uL 173 161 141(L)   VAS [place] Lower Extremity Right: No evidence of common femoral vein obstruction. Left: Findings consistent with chronic deep vein thrombosis involving the left popliteal vein. No cystic structure found in the popliteal fossa.    CXR:  New left greater than right basilar lung opacities concerning for pneumonia or possibly asymmetric edema. Small bilateral pleural effusions.  CT chest: No evidence of pulmonary emboli.  Bilateral small pleural effusions.  Bilateral lower lobe atelectasis/infiltrate with more focal confluent infiltrate in the lingula.  Discharge Instructions: Discharge Instructions    Diet - low sodium heart healthy   Complete by:  As directed     Diet - low sodium heart healthy   Complete by:  As directed    Discharge instructions   Complete by:  As directed    You were admitted to the hospital for treatment of pneumonia. We treated you with 5 days of antibiotics and your fever and blood work improved. You continued to be short of breath after you finished the antibiotics so we gave you a diuretic or water pill called lasix to help you pee out the extra fluid that was on your lungs. This helped a lot and I think you should keep taking lasix every other  day and follow up with your doctor in 1 week to repeat blood work and adjust the lasix dose if needed.   You will also be on a blood thinner, Eliquis, for at least 6 months. Please follow up with your doctor about this medicine as well.   If you notice fevers, or worsening productive cough or shortness of breath, decreased appetite, or worsening confusion please see a doctor.   Continue to stay as active as possible and stay hydrated. I wish you a speedy recovery! Please call your doctor to schedule a follow up within the next 1 week  Summary of new medications: - Lasix (furosemide) 20mg  every other day. This will make you pee so I suggest taking it in the morning - Eliquis (apixaban) 5mg  twice a day for at least 6 months   Increase activity slowly   Complete by:  As directed    Increase activity slowly   Complete by:  As directed       Signed: Isabelle Course, MD 06/14/2018, 10:03 AM   Pager: [phone]

## 2018-06-11 NOTE — Progress Notes (Signed)
Physical Therapy Treatment Patient Details Name: Allycia Pitz MRN: [id number] DOB: 05-Aug-1928 Today's Date: 06/11/2018    History of Present Illness Pt is an 85 y.o. female admitted 05/22/17 with AMS and hypertensive urgency. CXR without acute findings. Head CT negative for acute findings. MRI with small punctate foci. Per neurology, pt likely has an undiagnosed underlying dementia, with acute worsening secondary to hypertensive encephalopathy. PMH includes HTN, glaucoma.    PT Comments    Treatment session today significantly limited by hypotension, pt dizzy with all standing activity so did exercises in short bursts and then sat to recover. BP at R ankle 88/46 HR 61 bpm, SpO2 92% on 4L O2. Pt would likely benefit from rollator style RW with seat due to O2 demand and quick fatigue but she could not try this today due to dizziness. PT will continue to follow.   Follow Up Recommendations  Home health PT;Supervision/Assistance - 24 hour     Equipment Recommendations  Rolling walker with 5" wheels;3in1 (PT)(I believe she already has RW)    Recommendations for Other Services       Precautions / Restrictions Precautions Precautions: Fall Restrictions Weight Bearing Restrictions: No    Mobility  Bed Mobility Overal bed mobility: Modified Independent             General bed mobility comments: able to get to EOB and back into bed without physical assist but needs increased time.   Transfers Overall transfer level: Needs assistance Equipment used: Rolling walker (2 wheeled) Transfers: Sit to/from Stand Sit to Stand: Min guard         General transfer comment: vc's for hand placement. Pt had much less difficulty getting up when bed was raised 2". Practiced sit<>stand 6x from bed and chair  Ambulation/Gait Ambulation/Gait assistance: Min guard Gait Distance (Feet): 6 Feet Assistive device: Rolling walker (2 wheeled) Gait Pattern/deviations: Step-through  pattern;Decreased stride length;Trunk flexed Gait velocity: Decreased Gait velocity interpretation: <1.31 ft/sec, indicative of household ambulator General Gait Details: could not ambulate more than this today due to hypotension with dizziness   Stairs             Wheelchair Mobility    Modified Rankin (Stroke Patients Only) Modified Rankin (Stroke Patients Only) Pre-Morbid Rankin Score: No symptoms Modified Rankin: Moderately severe disability     Balance Overall balance assessment: Needs assistance   Sitting balance-Leahy Scale: Good Sitting balance - Comments: limited by dizziness today   Standing balance support: Bilateral upper extremity supported Standing balance-Leahy Scale: Poor Standing balance comment: needed UE support for all standing activities, static and dynamic                            Cognition Arousal/Alertness: Awake/alert Behavior During Therapy: WFL for tasks assessed/performed Overall Cognitive Status: History of cognitive impairments - at baseline Area of Impairment: Safety/judgement;Memory                 Orientation Level: Disoriented to;Situation Current Attention Level: Selective Memory: Decreased short-term memory Following Commands: Follows one step commands with increased time;Follows one step commands inconsistently Safety/Judgement: Decreased awareness of deficits;Decreased awareness of safety Awareness: Emergent Problem Solving: Slow processing;Decreased initiation;Requires verbal cues General Comments: slow to respond to questions and commands, minimal self initiated conversation      Exercises General Exercises - Lower Extremity Ankle Circles/Pumps: AROM;Both;10 reps;Supine;Seated Long Arc Quad: AROM;Both;10 reps;Seated Heel Slides: AROM;Both;10 reps;Supine Hip ABduction/ADduction: AROM;Both;10 reps;Standing Hip Flexion/Marching: AROM;Both;10 reps;Standing Heel Raises:  AROM;Both;10 reps;Standing     General Comments General comments (skin integrity, edema, etc.): BP 88/46 R ankle, HR 61 bpm, SpO2 92% on 4 L O2      Pertinent Vitals/Pain Pain Assessment: No/denies pain    Home Living                      Prior Function            PT Goals (current goals can now be found in the care plan section) Acute Rehab PT Goals Patient Stated Goal: Agreeable to amb PT Goal Formulation: With patient/family Time For Goal Achievement: 06/24/18 Potential to Achieve Goals: Good Progress towards PT goals: Progressing toward goals    Frequency    Min 3X/week      PT Plan Current plan remains appropriate    Co-evaluation              AM-PAC PT "6 Clicks" Mobility   Outcome Measure  Help needed turning from your back to your side while in a flat bed without using bedrails?: None Help needed moving from lying on your back to sitting on the side of a flat bed without using bedrails?: None Help needed moving to and from a bed to a chair (including a wheelchair)?: A Little   Help needed to walk in hospital room?: A Little Help needed climbing 3-5 steps with a railing? : A Lot 6 Click Score: 16    End of Session Equipment Utilized During Treatment: Gait belt;Oxygen Activity Tolerance: Treatment limited secondary to medical complications (Comment)(hypotension) Patient left: with call bell/phone within reach;in bed Nurse Communication: Mobility status PT Visit Diagnosis: Other abnormalities of gait and mobility (R26.89);Unsteadiness on feet (R26.81)(decr functional capacity)     Time: 0981-1914 PT Time Calculation (min) (ACUTE ONLY): 32 min  Charges:  $Therapeutic Exercise: 8-22 mins $Neuromuscular Re-education: 8-22 mins                     Leighton Roach, PT  Acute Rehab Services  Pager [phone] Office [place] 06/11/2018, 11:17 AM

## 2018-06-11 NOTE — Progress Notes (Addendum)
   Subjective: tired appearing. States her breathing is better. She appears more comfortable this morning but also very deconditioned. She walked to the bathroom with out supplemental O2 and got dizzy and sob. We discussed PT/OT recs for [facility] and 24hr supervision. She thinks her daughter would be able to stay with her but we will need to follow up on that.  Objective:  Vital signs in last 24 hours: Vitals:   06/10/18 2204 06/11/18 0500 06/11/18 0631 06/11/18 0820  BP: (!) 151/67  (!) 136/42 (!) 161/61  Pulse: 80   67  Resp: 18   20  Temp: 98 F (36.7 C)   98.8 F (37.1 C)  TempSrc: Oral   Oral  SpO2: 98%   90%  Weight:  70.7 kg    Height:       Gen: laying in bed,  NAD, tired appearing  Pulm: 3L [place], faint bibasilar crackles, diffuse crackles on left lung field sound drier than yesterday. Ext: warm, no LEE  Assessment/Plan:  Principal Problem:   Acute on chronic respiratory failure with hypoxia (HCC) Active Problems:   Essential hypertension   CAP (community acquired pneumonia)   Chronic deep vein thrombosis (DVT) of popliteal vein of left lower extremity ([place])  Ms. Peake is an 9yoF with HTN, prior CVA who presented with acute hypoxic respiratory failure, productive cough, leukocytosis, and new pulmonary infiltrate on CXR and CT concerning for pneumonia. She is being treated with IV antibiotics and has been slow to improve.  1. CAP, Acute on chronic hypoxic respiratory failure: CT showed evidence of chronic emphysema in addition to acute pneumonia. Overall she is Improving. s/p 5 days of ceftriaxone and azithromycin. Given 20mg  IV lasix yesterday with improvement in respiratory status. Lung exam sounds better today, crackles are less and not as wet.  - will give another dose of IV lasix - daughter would like to take her home instead of rehab facility. Patient lives with daughter and they have arranged for her bedroom to be on the main floor.  - consult case management for [facility]  PT/OT and home O2   2. Chronic DVT, left popliteal:  - transition to eliquis today  - will need at least 6 months of anticoagulation   3. HTN: largely well controlled - continue home amlodipine, hydralazine, nebivolol   4. CKD3: stable, avoid nephrotoxic meds.   5. Acute metabolic encephalopathy, dementia: no further episodes of delirium. Alert and oriented x 3 today.  - continue delirium precautions - can order sitter if needed - if not redirectable, can give low dose Seroquel   Dispo: Anticipated discharge in approximately 1 day(s) pending improvement in energy level and arrangement of [place] services and home O2  Isabelle Course, MD 06/11/2018, 10:50 AM Pager: [phone]

## 2018-06-11 NOTE — Care Management Important Message (Signed)
Important Message  Patient Details  Name: Moriya Mitchell MRN: [id number] Date of Birth: 10-17-1928   Medicare Important Message Given:  Yes    Amiri Riechers 06/11/2018, 4:18 PM

## 2018-06-11 NOTE — Progress Notes (Signed)
Medicine attending: I examined this patient today together with resident physician Dr Francesco Runner and I concur with her evaluation and management plan. Elderly woman admitted on January 28 with cough, dyspnea, and wheezing and found to have bilateral multifocal left greater than right pulmonary infiltrates consistent with pneumonia.  Initial white count 15,800 with 89% neutrophils.  She just completed 5 days of treatment for community-acquired pneumonia.  White count has normalized.  Chest x-ray has not and she remains hypoxic at rest.  Persistent, progressive, bilateral pleural effusions.  1 dose of diuretic given yesterday.  BNP elevated on admission with no baseline.  Lung exam unremarkable with minimal basilar rales.  Hemoglobin low but stable at 9 g.  Renal function abnormal but improved from her peak creatinine of 1.7 and BUN 47 recorded on January 17.  Current BUN 33, creatinine 1.3. She is frail, deconditioned, she is still requiring supplemental oxygen.  She is not stable for discharge at this time.  We think she would best be served by temporary skilled nursing facility placement and not immediate discharge to home.  Dr. Donne Hazel will discuss this with her daughter.

## 2018-06-12 ENCOUNTER — Inpatient Hospital Stay (HOSPITAL_COMMUNITY): Payer: Medicare Other

## 2018-06-12 DIAGNOSIS — I5021 Acute systolic (congestive) heart failure: Secondary | ICD-10-CM

## 2018-06-12 LAB — BASIC METABOLIC PANEL
Anion gap: 5 (ref 5–15)
BUN: 34 mg/dL — ABNORMAL HIGH (ref 8–23)
CHLORIDE: 110 mmol/L (ref 98–111)
CO2: 28 mmol/L (ref 22–32)
Calcium: 9.2 mg/dL (ref 8.9–10.3)
Creatinine, Ser: 1.41 mg/dL — ABNORMAL HIGH (ref 0.44–1.00)
GFR calc Af Amer: 39 mL/min — ABNORMAL LOW (ref >60)
GFR calc non Af Amer: 33 mL/min — ABNORMAL LOW (ref >60)
Glucose, Bld: 110 mg/dL — ABNORMAL HIGH (ref 70–99)
POTASSIUM: 4.1 mmol/L (ref 3.5–5.1)
Sodium: 143 mmol/L (ref 135–145)

## 2018-06-12 MED ORDER — FUROSEMIDE 10 MG/ML IJ SOLN
20.0000 mg | Freq: Once | INTRAMUSCULAR | Status: AC
  Administered 2018-06-12: 20 mg via INTRAVENOUS
  Filled 2018-06-12: qty 2

## 2018-06-12 NOTE — Progress Notes (Signed)
To radiology via [place] and on O2
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
## 2018-06-12 NOTE — Progress Notes (Signed)
Pt is no longer using HFNC.  Pt in on 3L [place] with and SpO2 of 97%/. No distress assessed at this time.

## 2018-06-12 NOTE — Progress Notes (Signed)
Occupational Therapy Treatment Patient Details Name: Rhonda Sharp MRN: [id number] DOB: 1929-01-26 Today's Date: 06/12/2018    History of present illness Pt is an 85 y.o. female admitted 05/22/17 with AMS and hypertensive urgency. CXR without acute findings. Head CT negative for acute findings. MRI with small punctate foci. Per neurology, pt likely has an undiagnosed underlying dementia, with acute worsening secondary to hypertensive encephalopathy. PMH includes HTN, glaucoma.   OT comments  Pt receiving reclined in bed. Pt currently requires minA with functional mobility at RW level for ADL completion. Upon arrival pt SpO2 96% 2lnc at rest; SpO2 90% RA following functional mobility, able to bring SpO2 up to 92% RA with pursed lip breathing, reapplied supplemental O2, SpO2 returned to 96%. Pt limited by fatigue and due to cognitive and functional deficits listed below (see OT problem list) will continue to benefit from skilled OT services to allow d/c to venue listed below. Will continue to follow acutely.    Follow Up Recommendations  Home health OT;Supervision/Assistance - 24 hour    Equipment Recommendations  None recommended by OT    Recommendations for Other Services      Precautions / Restrictions Precautions Precautions: Fall Restrictions Weight Bearing Restrictions: No       Mobility Bed Mobility Overal bed mobility: Modified Independent             General bed mobility comments: able to get to EOB without physical assist but needs increased time.   Transfers Overall transfer level: Needs assistance Equipment used: Rolling walker (2 wheeled) Transfers: Sit to/from Stand Sit to Stand: Min assist         General transfer comment: minA for stability while standing;vc for safe hand placement    Balance Overall balance assessment: Needs assistance Sitting-balance support: No upper extremity supported;Feet unsupported Sitting balance-Leahy Scale: Good Sitting  balance - Comments: pt adjusted socks while sitting EOB   Standing balance support: Single extremity supported;During functional activity Standing balance-Leahy Scale: Poor Standing balance comment: Single UE support while completing clothing manipulation following toileting                           ADL either performed or assessed with clinical judgement   ADL Overall ADL's : Needs assistance/impaired     Grooming: Set up;Sitting Grooming Details (indicate cue type and reason): pt combed hair after setupA                 Toilet Transfer: Minimal assistance;Ambulation Toilet Transfer Details (indicate cue type and reason): minA to take 4 steps from EOB to BSC;pt with notable instability without external support Toileting- Clothing Manipulation and Hygiene: Minimal assistance Toileting - Clothing Manipulation Details (indicate cue type and reason): minA to maintain balance while standing;pt able to complete pericare independently     Functional mobility during ADLs: Minimal assistance;Min guard;Rolling walker General ADL Comments: pt quickly fatigues;SpO2 90% RA after functional mobiltiy     Vision   Additional Comments: pt required assistance to read menu despite use of glasses;will further asses   Perception     Praxis      Cognition Arousal/Alertness: Awake/alert Behavior During Therapy: WFL for tasks assessed/performed Overall Cognitive Status: No family/caregiver present to determine baseline cognitive functioning Area of Impairment: Safety/judgement;Memory;Orientation;Attention;Problem solving;Awareness                 Orientation Level: Disoriented to;Situation Current Attention Level: Selective Memory: Decreased short-term memory Following Commands: Follows one step commands  with increased time Safety/Judgement: Decreased awareness of deficits;Decreased awareness of safety Awareness: Emergent Problem Solving: Slow processing;Decreased  initiation;Requires verbal cues General Comments: slow to respond to questions and commands, minimal self initiated conversation;required multimodal cues to browse through lunch menu and determine what she would like to eat;per nsg pt has not been awake/alert this am, during OT session pt was awake/alert        Exercises     Shoulder Instructions       General Comments SpO2 96% 2lnc;SpO2 90% RA following functional mobility returned to 92% RA with pursed lip breathing;2lnc and pursed lip breathing brought spO2 up to 96%    Pertinent Vitals/ Pain          Home Living                                          Prior Functioning/Environment              Frequency  Min 2X/week        Progress Toward Goals  OT Goals(current goals can now be found in the care plan section)  Progress towards OT goals: Progressing toward goals  Acute Rehab OT Goals Patient Stated Goal: to get back home to her daughter OT Goal Formulation: With patient Time For Goal Achievement: 06/24/18 Potential to Achieve Goals: Fair ADL Goals Pt Will Perform Grooming: with modified independence;standing Pt Will Perform Upper Body Bathing: with modified independence;sitting Pt Will Perform Lower Body Bathing: with modified independence;sit to/from stand Pt Will Perform Upper Body Dressing: with modified independence;sitting Pt Will Perform Lower Body Dressing: with modified independence;sit to/from stand Pt Will Transfer to Toilet: with modified independence;ambulating Pt Will Perform Toileting - Clothing Manipulation and hygiene: with modified independence;sit to/from stand Additional ADL Goal #1: Pt will perform transfers and ADL functional mobility with [place] Discharge plan remains appropriate    Co-evaluation                 AM-PAC OT "6 Clicks" Daily Activity     Outcome Measure   Help from another person eating meals?: A Little Help from another person  taking care of personal grooming?: A Little Help from another person toileting, which includes using toliet, bedpan, or urinal?: A Little Help from another person bathing (including washing, rinsing, drying)?: A Little Help from another person to put on and taking off regular upper body clothing?: A Little Help from another person to put on and taking off regular lower body clothing?: A Little 6 Click Score: 18    End of Session Equipment Utilized During Treatment: Rolling walker;Oxygen;Gait belt  OT Visit Diagnosis: Unsteadiness on feet (R26.81);Muscle weakness (generalized) (M62.81)   Activity Tolerance Patient limited by fatigue   Patient Left in chair;with call bell/phone within reach;with chair alarm set   Nurse Communication Mobility status        Time: 4627-0350 OT Time Calculation (min): 15 min  Charges: OT General Charges $OT Visit: 1 Visit OT Treatments $Self Care/Home Management : 8-22 mins  Dorinda Hill OTR/L Acute Rehabilitation Services Office: [place] 06/12/2018, 1:05 PM

## 2018-06-12 NOTE — Care Management Note (Signed)
Case Management Note  Patient Details  Name: Rhonda Sharp MRN: [id number] Date of Birth: [birth year]  Subjective/Objective:     From home, NCM offered choice from Medicare. gov list for [facility], HHPT, [place], she chose [facility], referral given to [facility] , await to see if can take patient.  She states she has a rolling walker at home and she does not want the 3 n 1. NCM gave patient the eliquis 30 day coupon and informed her of her copay of 10 for refills.  Oxygen is already in the room.              Action/Plan: DC home when ready.   Expected Discharge Date:                  Expected Discharge Plan:  [facility]  In-House Referral:  Clinical Social Work  Discharge planning Services  CM Consult  Post Acute Care Choice:  Home Health, Durable Medical Equipment Choice offered to:  Patient  DME Arranged:  Oxygen DME Agency:  [place] Arranged:  RN, Disease Management, PT, OT HH Agency:  [place]  Status of Service:  Completed, signed off  If discussed at [place] of Stay Meetings, dates discussed:    Additional Comments:  Zenon Mayo, RN 06/12/2018, 12:39 PM

## 2018-06-12 NOTE — Progress Notes (Signed)
   Subjective: sleeping, arousable but tired appearing. Oriented to self and month but thinks that she is at home. States that she is feeling better and her breathing is good. Discussed that CXR this morning shows more fluid on her lungs and we will give her another dose of lasix today.   Objective:  Vital signs in last 24 hours: Vitals:   06/11/18 2223 06/12/18 0500 06/12/18 0732 06/12/18 1004  BP: 137/79  (!) 173/69 (!) 180/64  Pulse: (!) 54  64   Resp:   18   Temp: 97.7 F (36.5 C)  98 F (36.7 C)   TempSrc: Oral  Oral   SpO2: 98%  97%   Weight:  71.9 kg    Height:       Gen: laying in bed,  NAD, tired appearing  Pulm: 3L [place], decreased breath sounds throughout due to poor effort, crackles seem improved from prior  Ext: warm, nonpitting edema of left lower extremity  Assessment/Plan:  Principal Problem:   Acute on chronic respiratory failure with hypoxia (HCC) Active Problems:   Essential hypertension   CAP (community acquired pneumonia)   Chronic deep vein thrombosis (DVT) of popliteal vein of left lower extremity (HCC)   Pleural effusion, bilateral  Rhonda Sharp is an 27yoF with HTN, prior CVA who presented with acute hypoxic respiratory failure, productive cough, leukocytosis, and new pulmonary infiltrate on CXR and CT concerning for pneumonia. She is being treated with IV antibiotics and has been slow to improve.  1. CAP, Acute on chronic hypoxic respiratory failure: Subjectively feels well. CXR this morning showed worsening pleural effusions L>R. Could be 2/2 dCHF on top of pneumonia. She remains afebrile without leukocytosis so doubt complicated parapneumonic effusion. Will continue IV lasix today. If no improvement in effusions, will consider thoracentesis.  - IV lasix 20mg   - stict I&O - wean O2 as tolerated  - home O2 has been arranged  2. Chronic DVT, left popliteal:  - continue eliquis for at least 6 months, f/u with PCP  3. HTN: largely well controlled -  continue home amlodipine, hydralazine, nebivolol   4. CKD3: slight bump in creatine after lasix, BUN unchanged. Will continue to monitor with ongoing diuresis.   5. Acute metabolic encephalopathy, dementia: episode of delirium overnight. Took off all her clothes and was trying to get out of bed. Nursing was able to redirect and calm her down after several attempts. This morning she is resting comfortably. Alert but only oriented to self and month. She thinks she is at home.  - very tired appearing. Per daughter this is not her baseline. She has been receiving ramelteon qhs. Will d/c this and see if she is more awake tomorrow morning.  - continue delirium precautions - can order sitter if needed - if not redirectable, can give low dose Seroquel   Dispo: Anticipated discharge in approximately 1 day(s) pending improvement in pleural effusions with continued diuresis. If effusions do not improve, will consider thoracentesis.   Isabelle Course, MD 06/12/2018, 10:46 AM Pager: [phone]

## 2018-06-12 NOTE — Progress Notes (Signed)
Medicine attending: I examined this patient today together with resident physician Dr Francesco Runner and we reviewed current and comparison chest radiographs. Acute pulmonary infection adequately treated with antibiotics.  Persistent hypoxia and bilateral pleural effusions likely related to acute congestive heart failure precipitated by the pneumonia. She continues to have episodes of "sundowning" but is currently alert.  She is oriented to person but not place. There are decreased breath sounds and rales one third the way up over both lungs.  Regular cardiac rhythm without S3 gallop.  No JVD.  1+ pedal edema.  Currently on 3 L nasal cannula oxygen with 97% saturation.  Blood pressure running high. We will continue daily reevaluation and administer parenteral diuretics cautiously.  Continue to monitor renal function.  Adjust antihypertensives as needed. Family still committed to providing her care at home following discharge.

## 2018-06-13 LAB — BASIC METABOLIC PANEL
Anion gap: 6 (ref 5–15)
BUN: 31 mg/dL — ABNORMAL HIGH (ref 8–23)
CHLORIDE: 109 mmol/L (ref 98–111)
CO2: 28 mmol/L (ref 22–32)
Calcium: 8.5 mg/dL — ABNORMAL LOW (ref 8.9–10.3)
Creatinine, Ser: 1.51 mg/dL — ABNORMAL HIGH (ref 0.44–1.00)
GFR calc Af Amer: 36 mL/min — ABNORMAL LOW (ref >60)
GFR calc non Af Amer: 31 mL/min — ABNORMAL LOW (ref >60)
Glucose, Bld: 139 mg/dL — ABNORMAL HIGH (ref 70–99)
Potassium: 4 mmol/L (ref 3.5–5.1)
Sodium: 143 mmol/L (ref 135–145)

## 2018-06-13 MED ORDER — FUROSEMIDE 10 MG/ML IJ SOLN
20.0000 mg | Freq: Once | INTRAMUSCULAR | Status: AC
  Administered 2018-06-13: 20 mg via INTRAVENOUS
  Filled 2018-06-13: qty 2

## 2018-06-13 NOTE — Progress Notes (Signed)
   Subjective: Feels better this morning. Does not appear as tired and is much more easily arousable. Oriented x 3 and asking when she can go home. Denies sob or chest pain. Eating and drinking well.   Objective:  Vital signs in last 24 hours: Vitals:   06/12/18 2309 06/13/18 0330 06/13/18 0545 06/13/18 0827  BP: (!) 143/60  (!) 138/55 (!) 136/54  Pulse: (!) 59   (!) 59  Resp: 20   13  Temp: 98.5 F (36.9 C)   97.9 F (36.6 C)  TempSrc:      SpO2: 95%   94%  Weight:  69.8 kg    Height:       Gen: laying in bed,  NAD, does not appear as tired compared to yesterday  Pulm: 3L [place], decreased breath sounds due to poor effort but CTAB, does not appear sob Cardiac: no JVD, extremities are without pitting edema   Assessment/Plan:  Principal Problem:   Acute on chronic respiratory failure with hypoxia (HCC) Active Problems:   Essential hypertension   CAP (community acquired pneumonia)   Chronic deep vein thrombosis (DVT) of popliteal vein of left lower extremity (HCC)   Pleural effusion, bilateral   CHF (congestive heart failure), NYHA class I, acute, systolic ([place])  Ms. Aveni is an 59yoF with HTN, prior CVA who presented with acute hypoxic respiratory failure, productive cough, leukocytosis, and new pulmonary infiltrate on CXR and CT concerning for pneumonia. She is being treated with IV antibiotics and has been slow to improve.  1. CAP, Acute on chronic hypoxic respiratory failure: Improving with diuresis. Creatine relatively stable. O2 requirement seems to be improving based on OT's note although she remains on 3L [place]. Lung exam is improved. She is asking when she can go home.  - BUN decreased and creatine largely unchanged, will challenge her volume status with another dose of IV lasix 20mg   - stict I&O - wean O2 as tolerated, ambulatory pulse ox today  - home O2 has been arranged  2. Chronic DVT, left popliteal:  - continue eliquis for at least 6 months, f/u with PCP  3.  HTN: largely well controlled - continue home amlodipine, hydralazine, nebivolol   4. CKD3: slight bump in creatine after lasix, BUN decreased. Will continue to monitor with ongoing diuresis.   5. Acute metabolic encephalopathy, dementia: Improved. Discontinued ramelteon last night. More alert this morning. - please avoid sedating medications  - continue delirium precautions - can order sitter if needed - if not redirectable, can give low dose Seroquel   Dispo: Looks much better today. Will reevaluate ambulatory O2 requirement and challenge fluid status with another dose of IV lasix. Anticipated discharge in approximately 1 day(s).  Isabelle Course, MD 06/13/2018, 9:16 AM Pager: [phone]

## 2018-06-13 NOTE — Progress Notes (Signed)
Physical Therapy Treatment Patient Details Name: Rhonda Sharp MRN: [id number] DOB: 1929-01-31 Today's Date: 06/13/2018    History of Present Illness Pt is an 85 y.o. female admitted 05/22/17 with AMS and hypertensive urgency. CXR without acute findings. Head CT negative for acute findings. MRI with small punctate foci. Per neurology, pt likely has an undiagnosed underlying dementia, with acute worsening secondary to hypertensive encephalopathy. PMH includes HTN, glaucoma.    PT Comments    Patient progressing well with therapy today, no longer dizzy with transfers and ambulating hallway distances on RA without DOE or LOB. Utilizing RW for safety, desat to 86% returns to 90 quickly. Educated family and patient on use of incentive spirometer. Daughter present will provide safe guarding for patient returning to home once medically ready. Stairs next PT session.      Follow Up Recommendations  Home health PT;Supervision/Assistance - 24 hour     Equipment Recommendations  (Rollator)    Recommendations for Other Services       Precautions / Restrictions Precautions Precautions: Fall Restrictions Weight Bearing Restrictions: No    Mobility  Bed Mobility               General bed mobility comments: sitting EOB   Transfers Overall transfer level: Needs assistance Equipment used: Rolling walker (2 wheeled) Transfers: Sit to/from Stand Sit to Stand: Min guard         General transfer comment: min guard for standing   Ambulation/Gait Ambulation/Gait assistance: Min guard Gait Distance (Feet): 120 Feet Assistive device: Rolling walker (2 wheeled) Gait Pattern/deviations: Step-through pattern;Decreased stride length;Trunk flexed Gait velocity: decreased   General Gait Details: no longer dizzy, pt ambulating hallway distances on RA, no DOE, desat to 86%, returns to 90 quickly. given IS to use.    Stairs             Wheelchair Mobility    Modified Rankin  (Stroke Patients Only)       Balance Overall balance assessment: Needs assistance Sitting-balance support: No upper extremity supported;Feet unsupported Sitting balance-Leahy Scale: Good       Standing balance-Leahy Scale: Poor                              Cognition Arousal/Alertness: Awake/alert Behavior During Therapy: WFL for tasks assessed/performed Overall Cognitive Status: No family/caregiver present to determine baseline cognitive functioning Area of Impairment: Safety/judgement;Memory;Orientation;Attention;Problem solving;Awareness                 Orientation Level: Disoriented to;Situation Current Attention Level: Selective Memory: Decreased short-term memory Following Commands: Follows one step commands with increased time Safety/Judgement: Decreased awareness of deficits;Decreased awareness of safety Awareness: Emergent Problem Solving: Slow processing;Decreased initiation;Requires verbal cues General Comments: slow to respond to questions and commands, minimal self initiated conversation;required multimodal cues to browse through lunch menu and determine what she would like to eat;per nsg pt has not been awake/alert this am, during OT session pt was awake/alert      Exercises      General Comments        Pertinent Vitals/Pain Faces Pain Scale: Hurts a little bit Pain Location: grimacing with bed mobility Pain Descriptors / Indicators: Grimacing Pain Intervention(s): Limited activity within patient's tolerance;Monitored during session;Premedicated before session;Repositioned    Home Living                      Prior Function  PT Goals (current goals can now be found in the care plan section) Acute Rehab PT Goals Patient Stated Goal: to get back home to her daughter PT Goal Formulation: With patient/family Time For Goal Achievement: 06/24/18 Potential to Achieve Goals: Good Progress towards PT goals: Progressing  toward goals    Frequency    Min 3X/week      PT Plan Current plan remains appropriate    Co-evaluation              AM-PAC PT "6 Clicks" Mobility   Outcome Measure  Help needed turning from your back to your side while in a flat bed without using bedrails?: None Help needed moving from lying on your back to sitting on the side of a flat bed without using bedrails?: None Help needed moving to and from a bed to a chair (including a wheelchair)?: A Little Help needed standing up from a chair using your arms (e.g., wheelchair or bedside chair)?: A Little Help needed to walk in hospital room?: A Little Help needed climbing 3-5 steps with a railing? : A Lot 6 Click Score: 19    End of Session Equipment Utilized During Treatment: Gait belt Activity Tolerance: Patient tolerated treatment well Patient left: with call bell/phone within reach;in chair;with nursing/sitter in room;with chair alarm set Nurse Communication: Mobility status PT Visit Diagnosis: Other abnormalities of gait and mobility (R26.89);Unsteadiness on feet (R26.81)     Time: 6468-0321 PT Time Calculation (min) (ACUTE ONLY): 19 min  Charges:  $Gait Training: 8-22 mins                    Reinaldo Berber, PT, DPT Acute Rehabilitation Services Pager: [phone] Office: [phone]     Reinaldo Berber 06/13/2018, 4:59 PM

## 2018-06-13 NOTE — Progress Notes (Signed)
SATURATION QUALIFICATIONS: (This note is used to comply with regulatory documentation for home oxygen)  Patient Saturations on Room Air at Rest = 95%  Patient Saturations on Room Air while Ambulating = 89%  Patient Saturations on 2 Liters of oxygen while Ambulating = 94%  Please briefly explain why patient needs home oxygen: 

## 2018-06-13 NOTE — Progress Notes (Signed)
Medicine attending: I examined this patient today together with resident physician Dr Francesco Runner and I concur with her evaluation and management plan. Clinically improved.  More alert and interactive.  Oxygenation has improved with diuresis and supplemental oxygen. Creatinine fluctuating but not higher than previous baseline.  BUN stable.  I feel we still have room for additional diuretic therapy. If she shows continued improvement, anticipate discharge tomorrow.  Hopefully she will not need home oxygen therapy but we might consider a daily or every other day low-dose diuretic.

## 2018-06-14 ENCOUNTER — Other Ambulatory Visit (HOSPITAL_COMMUNITY): Payer: Self-pay | Admitting: Internal Medicine

## 2018-06-14 DIAGNOSIS — I130 Hypertensive heart and chronic kidney disease with heart failure and stage 1 through stage 4 chronic kidney disease, or unspecified chronic kidney disease: Secondary | ICD-10-CM

## 2018-06-14 DIAGNOSIS — Z7901 Long term (current) use of anticoagulants: Secondary | ICD-10-CM

## 2018-06-14 DIAGNOSIS — I5033 Acute on chronic diastolic (congestive) heart failure: Secondary | ICD-10-CM

## 2018-06-14 DIAGNOSIS — R410 Disorientation, unspecified: Secondary | ICD-10-CM

## 2018-06-14 DIAGNOSIS — F05 Delirium due to known physiological condition: Secondary | ICD-10-CM

## 2018-06-14 LAB — BASIC METABOLIC PANEL
Anion gap: 10 (ref 5–15)
BUN: 35 mg/dL — AB (ref 8–23)
CALCIUM: 8.9 mg/dL (ref 8.9–10.3)
CO2: 25 mmol/L (ref 22–32)
Chloride: 107 mmol/L (ref 98–111)
Creatinine, Ser: 1.51 mg/dL — ABNORMAL HIGH (ref 0.44–1.00)
GFR calc Af Amer: 36 mL/min — ABNORMAL LOW (ref >60)
GFR calc non Af Amer: 31 mL/min — ABNORMAL LOW (ref >60)
Glucose, Bld: 119 mg/dL — ABNORMAL HIGH (ref 70–99)
Potassium: 4 mmol/L (ref 3.5–5.1)
Sodium: 142 mmol/L (ref 135–145)

## 2018-06-14 MED ORDER — FUROSEMIDE 20 MG PO TABS: 20.0000 mg | ORAL_TABLET | ORAL | 0 refills | Status: DC

## 2018-06-14 MED ORDER — LOPERAMIDE HCL 2 MG PO CAPS
2.0000 mg | ORAL_CAPSULE | ORAL | Status: AC | PRN
  Administered 2018-06-14: 2 mg via ORAL

## 2018-06-14 NOTE — Progress Notes (Signed)
Occupational Therapy Treatment Patient Details Name: Rhonda Sharp MRN: [id number] DOB: [birth year] Today's Date: 06/14/2018    History of present illness Pt is an 85 y.o. female admitted 05/22/17 with AMS and hypertensive urgency. CXR without acute findings. Head CT negative for acute findings. MRI with small punctate foci. Per neurology, pt likely has an undiagnosed underlying dementia, with acute worsening secondary to hypertensive encephalopathy. PMH includes HTN, glaucoma.   OT comments  Pt performed standing grooming, dressing, toileting and ambulation with RW with supervision to min assist. No SOB noted. Pt is eager to go home with her daughter.  Follow Up Recommendations  Home health OT;Supervision/Assistance - 24 hour    Equipment Recommendations  None recommended by OT    Recommendations for Other Services      Precautions / Restrictions Precautions Precautions: Fall Restrictions Weight Bearing Restrictions: No       Mobility Bed Mobility Overal bed mobility: Modified Independent             General bed mobility comments: sitting EOB   Transfers Overall transfer level: Needs assistance Equipment used: Rolling walker (2 wheeled) Transfers: Sit to/from Stand Sit to Stand: Supervision         General transfer comment: for safety    Balance Overall balance assessment: Needs assistance Sitting-balance support: No upper extremity supported;Feet unsupported Sitting balance-Leahy Scale: Good      Standing balance-Leahy Scale: Fair Standing balance comment: released walker in static standing at sink                           ADL either performed or assessed with clinical judgement   ADL Overall ADL's : Needs assistance/impaired Eating/Feeding: Independent;Sitting   Grooming: Wash/dry hands;Standing;Supervision/safety           Upper Body Dressing : Minimal assistance;Sitting   Lower Body Dressing: Supervision/safety;Sit to/from  stand   Toilet Transfer: Supervision/safety;Ambulation;RW;Regular Toilet   Toileting- Clothing Manipulation and Hygiene: Minimal assistance;Sit to/from stand       Functional mobility during ADLs: Supervision/safety;Rolling walker       Vision       Perception     Praxis      Cognition Arousal/Alertness: Awake/alert Behavior During Therapy: WFL for tasks assessed/performed Overall Cognitive Status: No family/caregiver present to determine baseline cognitive functioning Area of Impairment: Safety/judgement;Memory;Orientation;Attention;Problem solving;Awareness                 Orientation Level: Disoriented to;Situation Current Attention Level: Selective Memory: Decreased short-term memory Following Commands: Follows one step commands with increased time Safety/Judgement: Decreased awareness of deficits;Decreased awareness of safety Awareness: Emergent Problem Solving: Slow processing;Decreased initiation;Requires verbal cues         Exercises     Shoulder Instructions       General Comments      Pertinent Vitals/ Pain       Pain Assessment: No/denies pain  Home Living                                          Prior Functioning/Environment              Frequency  Min 2X/week        Progress Toward Goals  OT Goals(current goals can now be found in the care plan section)  Progress towards OT goals: Progressing toward goals  Acute Rehab OT Goals Patient  Stated Goal: to get back home to her daughter OT Goal Formulation: With patient Time For Goal Achievement: 06/24/18 Potential to Achieve Goals: Good  Plan Discharge plan remains appropriate    Co-evaluation                 AM-PAC OT "6 Clicks" Daily Activity     Outcome Measure   Help from another person eating meals?: None Help from another person taking care of personal grooming?: A Little Help from another person toileting, which includes using toliet,  bedpan, or urinal?: A Little Help from another person bathing (including washing, rinsing, drying)?: A Little Help from another person to put on and taking off regular upper body clothing?: A Little Help from another person to put on and taking off regular lower body clothing?: A Little 6 Click Score: 19    End of Session Equipment Utilized During Treatment: Rolling walker;Gait belt  OT Visit Diagnosis: Unsteadiness on feet (R26.81);Muscle weakness (generalized) (M62.81)   Activity Tolerance Patient tolerated treatment well   Patient Left in chair;with call bell/phone within reach;with chair alarm set   Nurse Communication          Time: [phone] OT Time Calculation (min): 13 min  Charges: OT General Charges $OT Visit: 1 Visit OT Treatments $Self Care/Home Management : 8-22 mins  Nestor Lewandowsky, OTR/L Acute Rehabilitation Services Pager: [phone] Office: [phone]   Rhonda Sharp 06/14/2018, 10:14 AM

## 2018-06-14 NOTE — Discharge Instructions (Addendum)
Information on my medicine - ELIQUIS (apixaban)  This medication education was reviewed with me or my healthcare representative as part of my discharge preparation.    Why was Eliquis prescribed for you? Eliquis was prescribed to treat blood clots that may have been found in the veins of your legs (deep vein thrombosis) or in your lungs (pulmonary embolism) and to reduce the risk of them occurring again.  What do You need to know about Eliquis  Your dose is ONE 5 mg tablet taken TWICE daily.  Eliquis may be taken with or without food.   Try to take the dose about the same time in the morning and in the evening. If you have difficulty swallowing the tablet whole please discuss with your pharmacist how to take the medication safely.  Take Eliquis exactly as prescribed and DO NOT stop taking Eliquis without talking to the doctor who prescribed the medication.  Stopping may increase your risk of developing a new blood clot.  Refill your prescription before you run out.  After discharge, you should have regular check-up appointments with your healthcare provider that is prescribing your Eliquis.    What do you do if you miss a dose? If a dose of ELIQUIS is not taken at the scheduled time, take it as soon as possible on the same day and twice-daily administration should be resumed. The dose should not be doubled to make up for a missed dose.  Important Safety Information A possible side effect of Eliquis is bleeding. You should call your healthcare provider right away if you experience any of the following: ? Bleeding from an injury or your nose that does not stop. ? Unusual colored urine (red or dark brown) or unusual colored stools (red or black). ? Unusual bruising for unknown reasons. ? A serious fall or if you hit your head (even if there is no bleeding).  Some medicines may interact with Eliquis and might increase your risk of bleeding or clotting while on Eliquis. To help avoid  this, consult your healthcare provider or pharmacist prior to using any new prescription or non-prescription medications, including herbals, vitamins, non-steroidal anti-inflammatory drugs (NSAIDs) and supplements.  This website has more information on Eliquis (apixaban): http://www.eliquis.com/eliquis/home  Furosemide tablets What is this medicine? FUROSEMIDE (fyoor [place] se mide) is a diuretic. It helps you make more urine and to lose salt and excess water from your body. This medicine is used to treat high blood pressure, and edema or swelling from heart, kidney, or liver disease. This medicine may be used for other purposes; ask your health care provider or pharmacist if you have questions. COMMON BRAND NAME(S): Active-Medicated Specimen Kit, Delone, Diuscreen, Lasix, RX Specimen Collection Kit, Specimen Collection Kit, URINX Medicated Specimen Collection What should I tell my health care provider before I take this medicine? They need to know if you have any of these conditions: -abnormal blood electrolytes -diarrhea or vomiting -gout -heart disease -kidney disease, small amounts of urine, or difficulty passing urine -liver disease -thyroid disease -an unusual or allergic reaction to furosemide, sulfa drugs, other medicines, foods, dyes, or preservatives -pregnant or trying to get pregnant -breast-feeding How should I use this medicine? Take this medicine by mouth with a glass of water. Follow the directions on the prescription label. You may take this medicine with or without food. If it upsets your stomach, take it with food or milk. Do not take your medicine more often than directed. Remember that you will need to pass more  urine after taking this medicine. Do not take your medicine at a time of day that will cause you problems. Do not take at bedtime. Talk to your pediatrician regarding the use of this medicine in children. While this drug may be prescribed for selected conditions,  precautions do apply. Overdosage: If you think you have taken too much of this medicine contact a poison control center or emergency room at once. NOTE: This medicine is only for you. Do not share this medicine with others. What if I miss a dose? If you miss a dose, take it as soon as you can. If it is almost time for your next dose, take only that dose. Do not take double or extra doses. What may interact with this medicine? -aspirin and aspirin-like medicines -certain antibiotics -chloral hydrate -cisplatin -cyclosporine -digoxin -diuretics -laxatives -lithium -medicines for blood pressure -medicines that relax muscles for surgery -methotrexate -NSAIDs, medicines for pain and inflammation like ibuprofen, naproxen, or indomethacin -phenytoin -steroid medicines like prednisone or cortisone -sucralfate -thyroid hormones This list may not describe all possible interactions. Give your health care provider a list of all the medicines, herbs, non-prescription drugs, or dietary supplements you use. Also tell them if you smoke, drink alcohol, or use illegal drugs. Some items may interact with your medicine. What should I watch for while using this medicine? Visit your doctor or health care professional for regular checks on your progress. Check your blood pressure regularly. Ask your doctor or health care professional what your blood pressure should be, and when you should contact him or her. If you are a diabetic, check your blood sugar as directed. You may need to be on a special diet while taking this medicine. Check with your doctor. Also, ask how many glasses of fluid you need to drink a day. You must not get dehydrated. You may get drowsy or dizzy. Do not drive, use machinery, or do anything that needs mental alertness until you know how this drug affects you. Do not stand or sit up quickly, especially if you are an older patient. This reduces the risk of dizzy or fainting spells. Alcohol  can make you more drowsy and dizzy. Avoid alcoholic drinks. This medicine can make you more sensitive to the sun. Keep out of the sun. If you cannot avoid being in the sun, wear protective clothing and use sunscreen. Do not use sun lamps or tanning beds/booths. What side effects may I notice from receiving this medicine? Side effects that you should report to your doctor or health care professional as soon as possible: -blood in urine or stools -dry mouth -fever or chills -hearing loss or ringing in the ears -irregular heartbeat -muscle pain or weakness, cramps -skin rash -stomach upset, pain, or nausea -tingling or numbness in the hands or feet -unusually weak or tired -vomiting or diarrhea -yellowing of the eyes or skin Side effects that usually do not require medical attention (report to your doctor or health care professional if they continue or are bothersome): -headache -loss of appetite -unusual bleeding or bruising This list may not describe all possible side effects. Call your doctor for medical advice about side effects. You may report side effects to FDA at 1-800-FDA-1088. Where should I keep my medicine? Keep out of the reach of children. Store at room temperature between 15 and 30 degrees C (59 and 86 degrees F). Protect from light. Throw away any unused medicine after the expiration date. NOTE: This sheet is a summary. It may not  cover all possible information. If you have questions about this medicine, talk to your doctor, pharmacist, or health care provider.  2019 Elsevier/Gold Standard (2014-07-16 13:49:50)

## 2018-06-14 NOTE — Progress Notes (Signed)
   Subjective: Looks and feels very good today. Is off supplemental O2 and walking the halls without difficulty. Will d/c home today. Discussed new medicines (lasix and eliquis) and the need for close outpt f/u.   Objective:  Vital signs in last 24 hours: Vitals:   06/14/18 0422 06/14/18 0737 06/14/18 0740 06/14/18 0800  BP:  115/65 115/65   Pulse:   67 63  Resp:      Temp:    97.8 F (36.6 C)  TempSrc:    Oral  SpO2:    93%  Weight: 68.2 kg     Height:       Gen: sitting up in bed, well appearing Pulm: on room air, CTAB  Assessment/Plan:  Principal Problem:   Acute on chronic respiratory failure with hypoxia ([place]) Active Problems:   Essential hypertension   CAP (community acquired pneumonia)   Chronic deep vein thrombosis (DVT) of popliteal vein of left lower extremity ([facility])   Pleural effusion, bilateral   CHF (congestive heart failure), NYHA class I, acute, systolic ([place])  Rhonda Sharp is an 24yoF with HTN, prior CVA who presented with acute hypoxic respiratory failure, productive cough, leukocytosis, and new pulmonary infiltrate on CXR and CT concerning for pneumonia. She is being treated with IV antibiotics and has been slow to improve.  1. Acute on chronic hypoxic respiratory failure:  CAP  Acute dCHF Exacerbation Improving. Diuresed again yesterday and is now on room air. Denies sob, chest pain. Lungs are CTA. - d/c home on lasix 20mg  qod with close outpt f/u   2. Chronic DVT, left popliteal:  - continue eliquis for at least 6 months, f/u with PCP   Dispo: today   Rhonda Course, MD 06/14/2018, 10:16 AM Pager: [phone]

## 2018-06-14 NOTE — Progress Notes (Signed)
Physical Therapy Treatment Patient Details Name: Rhonda Sharp MRN: [id number] DOB: [birth year] Today's Date: 06/14/2018    History of Present Illness Pt is an 85 y.o. female admitted 05/22/17 with AMS and hypertensive urgency. CXR without acute findings. Head CT negative for acute findings. MRI with small punctate foci. Per neurology, pt likely has an undiagnosed underlying dementia, with acute worsening secondary to hypertensive encephalopathy. PMH includes HTN, glaucoma.    PT Comments    Patient progressing well with therapy today, now ambulating stairs with supervision. After 150' of walking de sat to 88% returns quickly, no DOE noted throughout session.   Follow Up Recommendations  Home health PT;Supervision/Assistance - 24 hour     Equipment Recommendations  Rollator;3in1 (PT)    Recommendations for Other Services       Precautions / Restrictions Precautions Precautions: Fall Restrictions Weight Bearing Restrictions: No    Mobility  Bed Mobility               General bed mobility comments: sitting EOB   Transfers Overall transfer level: Needs assistance Equipment used: Rolling walker (2 wheeled) Transfers: Sit to/from Stand Sit to Stand: Min guard         General transfer comment: min guard for standing   Ambulation/Gait Ambulation/Gait assistance: Min guard Gait Distance (Feet): 150 Feet Assistive device: Rolling walker (2 wheeled) Gait Pattern/deviations: Step-through pattern;Decreased stride length;Trunk flexed Gait velocity: decreased   General Gait Details: no DOE or LOB ambulating with RW. after 150' sat at 88%, returns quickly. no symptoms    Stairs  patient performed 3 stairs with UE support on rails, altnerating patern, Supervision level. Advised to have family present for home entry to maximize safety.            Wheelchair Mobility    Modified Rankin (Stroke Patients Only)       Balance Overall balance assessment: Needs  assistance Sitting-balance support: No upper extremity supported;Feet unsupported Sitting balance-Leahy Scale: Good Sitting balance - Comments: pt adjusted socks while sitting EOB     Standing balance-Leahy Scale: Poor                              Cognition Arousal/Alertness: Awake/alert Behavior During Therapy: WFL for tasks assessed/performed Overall Cognitive Status: No family/caregiver present to determine baseline cognitive functioning Area of Impairment: Safety/judgement;Memory;Orientation;Attention;Problem solving;Awareness                 Orientation Level: Disoriented to;Situation Current Attention Level: Selective Memory: Decreased short-term memory Following Commands: Follows one step commands with increased time Safety/Judgement: Decreased awareness of deficits;Decreased awareness of safety Awareness: Emergent Problem Solving: Slow processing;Decreased initiation;Requires verbal cues General Comments: slow to respond to questions and commands, minimal self initiated conversation;required multimodal cues to browse through lunch menu and determine what she would like to eat;per nsg pt has not been awake/alert this am, during OT session pt was awake/alert      Exercises      General Comments        Pertinent Vitals/Pain Pain Assessment: No/denies pain    Home Living                      Prior Function            PT Goals (current goals can now be found in the care plan section) Acute Rehab PT Goals Patient Stated Goal: to get back home to her daughter PT Goal  Formulation: With patient/family Time For Goal Achievement: 06/24/18 Potential to Achieve Goals: Good Progress towards PT goals: Progressing toward goals    Frequency    Min 3X/week      PT Plan Current plan remains appropriate    Co-evaluation              AM-PAC PT "6 Clicks" Mobility   Outcome Measure  Help needed turning from your back to your side  while in a flat bed without using bedrails?: None Help needed moving from lying on your back to sitting on the side of a flat bed without using bedrails?: None Help needed moving to and from a bed to a chair (including a wheelchair)?: A Little Help needed standing up from a chair using your arms (e.g., wheelchair or bedside chair)?: A Little Help needed to walk in hospital room?: A Little Help needed climbing 3-5 steps with a railing? : A Lot 6 Click Score: 19    End of Session Equipment Utilized During Treatment: Gait belt Activity Tolerance: Patient tolerated treatment well Patient left: with call bell/phone within reach;in chair;with nursing/sitter in room;with chair alarm set Nurse Communication: Mobility status PT Visit Diagnosis: Other abnormalities of gait and mobility (R26.89);Unsteadiness on feet (R26.81)     Time: 7416-3845 PT Time Calculation (min) (ACUTE ONLY): 15 min  Charges:  $Gait Training: 8-22 mins                    Reinaldo Berber, PT, DPT Acute Rehabilitation Services Pager: [phone] Office: [phone]    Reinaldo Berber 06/14/2018, 9:48 AM

## 2018-06-21 DIAGNOSIS — I5033 Acute on chronic diastolic (congestive) heart failure: Secondary | ICD-10-CM | POA: Diagnosis not present

## 2018-06-21 DIAGNOSIS — I10 Essential (primary) hypertension: Secondary | ICD-10-CM | POA: Diagnosis not present

## 2018-06-21 DIAGNOSIS — I82532 Chronic embolism and thrombosis of left popliteal vein: Secondary | ICD-10-CM | POA: Diagnosis not present

## 2018-06-21 DIAGNOSIS — Z6825 Body mass index (BMI) 25.0-25.9, adult: Secondary | ICD-10-CM | POA: Diagnosis not present

## 2018-06-21 DIAGNOSIS — J189 Pneumonia, unspecified organism: Secondary | ICD-10-CM | POA: Diagnosis not present

## 2018-06-21 DIAGNOSIS — S41101A Unspecified open wound of right upper arm, initial encounter: Secondary | ICD-10-CM | POA: Diagnosis not present

## 2018-06-21 DIAGNOSIS — N183 Chronic kidney disease, stage 3 (moderate): Secondary | ICD-10-CM | POA: Diagnosis not present

## 2018-07-18 ENCOUNTER — Inpatient Hospital Stay: Payer: Medicare Other | Admitting: Adult Health

## 2018-08-31 DIAGNOSIS — N183 Chronic kidney disease, stage 3 (moderate): Secondary | ICD-10-CM | POA: Diagnosis not present

## 2018-08-31 DIAGNOSIS — Z7901 Long term (current) use of anticoagulants: Secondary | ICD-10-CM | POA: Diagnosis not present

## 2018-08-31 DIAGNOSIS — I82532 Chronic embolism and thrombosis of left popliteal vein: Secondary | ICD-10-CM | POA: Diagnosis not present

## 2018-08-31 DIAGNOSIS — J9621 Acute and chronic respiratory failure with hypoxia: Secondary | ICD-10-CM | POA: Diagnosis not present

## 2018-08-31 DIAGNOSIS — I700 Atherosclerosis of aorta: Secondary | ICD-10-CM | POA: Diagnosis not present

## 2018-08-31 DIAGNOSIS — I130 Hypertensive heart and chronic kidney disease with heart failure and stage 1 through stage 4 chronic kidney disease, or unspecified chronic kidney disease: Secondary | ICD-10-CM | POA: Diagnosis not present

## 2018-08-31 DIAGNOSIS — I5033 Acute on chronic diastolic (congestive) heart failure: Secondary | ICD-10-CM | POA: Diagnosis not present

## 2018-08-31 DIAGNOSIS — G9340 Encephalopathy, unspecified: Secondary | ICD-10-CM | POA: Diagnosis not present

## 2018-08-31 DIAGNOSIS — J189 Pneumonia, unspecified organism: Secondary | ICD-10-CM | POA: Diagnosis not present

## 2018-11-12 ENCOUNTER — Encounter (HOSPITAL_COMMUNITY): Payer: Self-pay | Admitting: Emergency Medicine

## 2018-11-12 ENCOUNTER — Observation Stay (HOSPITAL_BASED_OUTPATIENT_CLINIC_OR_DEPARTMENT_OTHER): Payer: Medicare Other

## 2018-11-12 ENCOUNTER — Inpatient Hospital Stay (HOSPITAL_COMMUNITY)
Admission: EM | Admit: 2018-11-12 | Discharge: 2018-11-16 | DRG: 291 | Disposition: A | Discharge status: Home Health | Payer: Medicare Other | Attending: Internal Medicine | Admitting: Internal Medicine

## 2018-11-12 ENCOUNTER — Emergency Department (HOSPITAL_COMMUNITY): Payer: Medicare Other

## 2018-11-12 ENCOUNTER — Other Ambulatory Visit: Payer: Self-pay

## 2018-11-12 DIAGNOSIS — D72829 Elevated white blood cell count, unspecified: Secondary | ICD-10-CM | POA: Diagnosis not present

## 2018-11-12 DIAGNOSIS — N184 Chronic kidney disease, stage 4 (severe): Secondary | ICD-10-CM | POA: Diagnosis present

## 2018-11-12 DIAGNOSIS — I10 Essential (primary) hypertension: Secondary | ICD-10-CM

## 2018-11-12 DIAGNOSIS — J9601 Acute respiratory failure with hypoxia: Secondary | ICD-10-CM | POA: Diagnosis present

## 2018-11-12 DIAGNOSIS — F0390 Unspecified dementia without behavioral disturbance: Secondary | ICD-10-CM | POA: Diagnosis not present

## 2018-11-12 DIAGNOSIS — R0602 Shortness of breath: Secondary | ICD-10-CM

## 2018-11-12 DIAGNOSIS — I160 Hypertensive urgency: Secondary | ICD-10-CM | POA: Diagnosis present

## 2018-11-12 DIAGNOSIS — I491 Atrial premature depolarization: Secondary | ICD-10-CM | POA: Diagnosis present

## 2018-11-12 DIAGNOSIS — I361 Nonrheumatic tricuspid (valve) insufficiency: Secondary | ICD-10-CM | POA: Diagnosis not present

## 2018-11-12 DIAGNOSIS — I509 Heart failure, unspecified: Secondary | ICD-10-CM | POA: Diagnosis not present

## 2018-11-12 DIAGNOSIS — H409 Unspecified glaucoma: Secondary | ICD-10-CM | POA: Diagnosis present

## 2018-11-12 DIAGNOSIS — I130 Hypertensive heart and chronic kidney disease with heart failure and stage 1 through stage 4 chronic kidney disease, or unspecified chronic kidney disease: Principal | ICD-10-CM | POA: Diagnosis present

## 2018-11-12 DIAGNOSIS — Z9981 Dependence on supplemental oxygen: Secondary | ICD-10-CM

## 2018-11-12 DIAGNOSIS — Z8249 Family history of ischemic heart disease and other diseases of the circulatory system: Secondary | ICD-10-CM

## 2018-11-12 DIAGNOSIS — Z79899 Other long term (current) drug therapy: Secondary | ICD-10-CM

## 2018-11-12 DIAGNOSIS — R0902 Hypoxemia: Secondary | ICD-10-CM | POA: Diagnosis not present

## 2018-11-12 DIAGNOSIS — Z20828 Contact with and (suspected) exposure to other viral communicable diseases: Secondary | ICD-10-CM | POA: Diagnosis present

## 2018-11-12 DIAGNOSIS — F1010 Alcohol abuse, uncomplicated: Secondary | ICD-10-CM | POA: Diagnosis present

## 2018-11-12 DIAGNOSIS — D696 Thrombocytopenia, unspecified: Secondary | ICD-10-CM | POA: Diagnosis present

## 2018-11-12 DIAGNOSIS — Z8673 Personal history of transient ischemic attack (TIA), and cerebral infarction without residual deficits: Secondary | ICD-10-CM

## 2018-11-12 DIAGNOSIS — Z853 Personal history of malignant neoplasm of breast: Secondary | ICD-10-CM

## 2018-11-12 DIAGNOSIS — I5033 Acute on chronic diastolic (congestive) heart failure: Secondary | ICD-10-CM | POA: Diagnosis not present

## 2018-11-12 DIAGNOSIS — E785 Hyperlipidemia, unspecified: Secondary | ICD-10-CM | POA: Diagnosis present

## 2018-11-12 DIAGNOSIS — F05 Delirium due to known physiological condition: Secondary | ICD-10-CM | POA: Diagnosis present

## 2018-11-12 DIAGNOSIS — I110 Hypertensive heart disease with heart failure: Secondary | ICD-10-CM | POA: Diagnosis not present

## 2018-11-12 DIAGNOSIS — Z923 Personal history of irradiation: Secondary | ICD-10-CM

## 2018-11-12 DIAGNOSIS — N179 Acute kidney failure, unspecified: Secondary | ICD-10-CM | POA: Diagnosis present

## 2018-11-12 DIAGNOSIS — D649 Anemia, unspecified: Secondary | ICD-10-CM | POA: Diagnosis present

## 2018-11-12 DIAGNOSIS — Z87891 Personal history of nicotine dependence: Secondary | ICD-10-CM

## 2018-11-12 DIAGNOSIS — N183 Chronic kidney disease, stage 3 (moderate): Secondary | ICD-10-CM | POA: Diagnosis not present

## 2018-11-12 DIAGNOSIS — I82532 Chronic embolism and thrombosis of left popliteal vein: Secondary | ICD-10-CM | POA: Diagnosis not present

## 2018-11-12 DIAGNOSIS — Z882 Allergy status to sulfonamides status: Secondary | ICD-10-CM

## 2018-11-12 DIAGNOSIS — Z7901 Long term (current) use of anticoagulants: Secondary | ICD-10-CM

## 2018-11-12 LAB — URINALYSIS, ROUTINE W REFLEX MICROSCOPIC
Bacteria, UA: NONE SEEN
Bilirubin Urine: NEGATIVE
Glucose, UA: NEGATIVE mg/dL
Hgb urine dipstick: NEGATIVE
Ketones, ur: NEGATIVE mg/dL
Nitrite: NEGATIVE
Protein, ur: 30 mg/dL — AB
Specific Gravity, Urine: 1.008 (ref 1.005–1.030)
pH: 5 (ref 5.0–8.0)

## 2018-11-12 LAB — CBC WITH DIFFERENTIAL/PLATELET
Abs Immature Granulocytes: 0.12 10*3/uL — ABNORMAL HIGH (ref 0.00–0.07)
Basophils Absolute: 0.1 10*3/uL (ref 0.0–0.1)
Basophils Relative: 1 %
Eosinophils Absolute: 0.1 10*3/uL (ref 0.0–0.5)
Eosinophils Relative: 1 %
HCT: 33.5 % — ABNORMAL LOW (ref 36.0–46.0)
Hemoglobin: 10.7 g/dL — ABNORMAL LOW (ref 12.0–15.0)
Immature Granulocytes: 1 %
Lymphocytes Relative: 7 %
Lymphs Abs: 1 10*3/uL (ref 0.7–4.0)
MCH: 30.1 pg (ref 26.0–34.0)
MCHC: 31.9 g/dL (ref 30.0–36.0)
MCV: 94.4 fL (ref 80.0–100.0)
Monocytes Absolute: 1.2 10*3/uL — ABNORMAL HIGH (ref 0.1–1.0)
Monocytes Relative: 9 %
Neutro Abs: 10.8 10*3/uL — ABNORMAL HIGH (ref 1.7–7.7)
Neutrophils Relative %: 81 %
Platelets: 137 10*3/uL — ABNORMAL LOW (ref 150–400)
RBC: 3.55 MIL/uL — ABNORMAL LOW (ref 3.87–5.11)
RDW: 14.3 % (ref 11.5–15.5)
WBC: 13.3 10*3/uL — ABNORMAL HIGH (ref 4.0–10.5)
nRBC: 0 % (ref 0.0–0.2)

## 2018-11-12 LAB — TRIGLYCERIDES: Triglycerides: 74 mg/dL (ref <150)

## 2018-11-12 LAB — TROPONIN I (HIGH SENSITIVITY): Troponin I (High Sensitivity): 12 ng/L (ref <18)

## 2018-11-12 LAB — COMPREHENSIVE METABOLIC PANEL
ALT: 66 U/L — ABNORMAL HIGH (ref 0–44)
AST: 64 U/L — ABNORMAL HIGH (ref 15–41)
Albumin: 3.9 g/dL (ref 3.5–5.0)
Alkaline Phosphatase: 97 U/L (ref 38–126)
Anion gap: 12 (ref 5–15)
BUN: 36 mg/dL — ABNORMAL HIGH (ref 8–23)
CO2: 18 mmol/L — ABNORMAL LOW (ref 22–32)
Calcium: 9.5 mg/dL (ref 8.9–10.3)
Chloride: 111 mmol/L (ref 98–111)
Creatinine, Ser: 1.66 mg/dL — ABNORMAL HIGH (ref 0.44–1.00)
GFR calc Af Amer: 32 mL/min — ABNORMAL LOW (ref >60)
GFR calc non Af Amer: 27 mL/min — ABNORMAL LOW (ref >60)
Glucose, Bld: 131 mg/dL — ABNORMAL HIGH (ref 70–99)
Potassium: 4.6 mmol/L (ref 3.5–5.1)
Sodium: 141 mmol/L (ref 135–145)
Total Bilirubin: 1.1 mg/dL (ref 0.3–1.2)
Total Protein: 7.3 g/dL (ref 6.5–8.1)

## 2018-11-12 LAB — FIBRINOGEN: Fibrinogen: 531 mg/dL — ABNORMAL HIGH (ref 210–475)

## 2018-11-12 LAB — SARS CORONAVIRUS 2 BY RT PCR (HOSPITAL ORDER, PERFORMED IN ~~LOC~~ HOSPITAL LAB): SARS Coronavirus 2: NEGATIVE

## 2018-11-12 LAB — ECHOCARDIOGRAM COMPLETE
Height: 67 in
Weight: 2292.78 oz

## 2018-11-12 LAB — LACTIC ACID, PLASMA: Lactic Acid, Venous: 0.9 mmol/L (ref 0.5–1.9)

## 2018-11-12 LAB — PROCALCITONIN: Procalcitonin: <0.1 ng/mL

## 2018-11-12 LAB — LACTATE DEHYDROGENASE: LDH: 220 U/L — ABNORMAL HIGH (ref 98–192)

## 2018-11-12 LAB — C-REACTIVE PROTEIN: CRP: 2.9 mg/dL — ABNORMAL HIGH (ref <1.0)

## 2018-11-12 LAB — BRAIN NATRIURETIC PEPTIDE: B Natriuretic Peptide: 984.4 pg/mL — ABNORMAL HIGH (ref 0.0–100.0)

## 2018-11-12 LAB — D-DIMER, QUANTITATIVE: D-Dimer, Quant: 0.78 ug/mL-FEU — ABNORMAL HIGH (ref 0.00–0.50)

## 2018-11-12 LAB — GLUCOSE, CAPILLARY: Glucose-Capillary: 88 mg/dL (ref 70–99)

## 2018-11-12 LAB — FERRITIN: Ferritin: 96 ng/mL (ref 11–307)

## 2018-11-12 MED ORDER — NEBIVOLOL HCL 10 MG PO TABS
10.0000 mg | ORAL_TABLET | Freq: Every day | ORAL | Status: DC
  Administered 2018-11-12 – 2018-11-16 (×5): 10 mg via ORAL
  Filled 2018-11-12 (×5): qty 1

## 2018-11-12 MED ORDER — AMLODIPINE BESYLATE 10 MG PO TABS
10.0000 mg | ORAL_TABLET | Freq: Every day | ORAL | Status: DC
  Administered 2018-11-12 – 2018-11-16 (×5): 10 mg via ORAL
  Filled 2018-11-12 (×3): qty 1
  Filled 2018-11-12: qty 2
  Filled 2018-11-12: qty 1

## 2018-11-12 MED ORDER — ALBUTEROL SULFATE (2.5 MG/3ML) 0.083% IN NEBU: 5.0000 mg | INHALATION_SOLUTION | Freq: Once | RESPIRATORY_TRACT | Status: DC

## 2018-11-12 MED ORDER — FUROSEMIDE 10 MG/ML IJ SOLN
20.0000 mg | Freq: Once | INTRAMUSCULAR | Status: AC
  Administered 2018-11-12: 20 mg via INTRAVENOUS
  Filled 2018-11-12: qty 2

## 2018-11-12 MED ORDER — SODIUM CHLORIDE 0.9 % IV SOLN: 250.0000 mL | INTRAVENOUS | Status: DC | PRN

## 2018-11-12 MED ORDER — PRAVASTATIN SODIUM 10 MG PO TABS
20.0000 mg | ORAL_TABLET | Freq: Every day | ORAL | Status: DC
  Administered 2018-11-12 – 2018-11-15 (×4): 20 mg via ORAL
  Filled 2018-11-12 (×4): qty 2

## 2018-11-12 MED ORDER — FUROSEMIDE 10 MG/ML IJ SOLN
20.0000 mg | Freq: Two times a day (BID) | INTRAMUSCULAR | Status: DC
  Administered 2018-11-12 – 2018-11-13 (×3): 20 mg via INTRAVENOUS
  Filled 2018-11-12 (×3): qty 2

## 2018-11-12 MED ORDER — SODIUM CHLORIDE 0.9% FLUSH
3.0000 mL | Freq: Two times a day (BID) | INTRAVENOUS | Status: DC
  Administered 2018-11-12 – 2018-11-16 (×9): 3 mL via INTRAVENOUS

## 2018-11-12 MED ORDER — ONDANSETRON HCL 4 MG/2ML IJ SOLN
4.0000 mg | Freq: Four times a day (QID) | INTRAMUSCULAR | Status: DC | PRN
  Filled 2018-11-12: qty 2

## 2018-11-12 MED ORDER — APIXABAN 5 MG PO TABS
5.0000 mg | ORAL_TABLET | Freq: Two times a day (BID) | ORAL | Status: DC
  Administered 2018-11-12 – 2018-11-13 (×3): 5 mg via ORAL
  Filled 2018-11-12 (×4): qty 1

## 2018-11-12 MED ORDER — SODIUM CHLORIDE 0.9% FLUSH: 3.0000 mL | INTRAVENOUS | Status: DC | PRN

## 2018-11-12 MED ORDER — ACETAMINOPHEN 325 MG PO TABS: 650.0000 mg | ORAL_TABLET | ORAL | Status: DC | PRN

## 2018-11-12 MED ORDER — HYDRALAZINE HCL 50 MG PO TABS
50.0000 mg | ORAL_TABLET | Freq: Three times a day (TID) | ORAL | Status: DC
  Administered 2018-11-12 – 2018-11-16 (×12): 50 mg via ORAL
  Filled 2018-11-12 (×3): qty 1
  Filled 2018-11-12: qty 2
  Filled 2018-11-12 (×8): qty 1

## 2018-11-12 MED ORDER — GUAIFENESIN ER 600 MG PO TB12
600.0000 mg | ORAL_TABLET | Freq: Two times a day (BID) | ORAL | Status: DC
  Administered 2018-11-12 – 2018-11-16 (×9): 600 mg via ORAL
  Filled 2018-11-12 (×9): qty 1

## 2018-11-12 MED ORDER — FUROSEMIDE 10 MG/ML IJ SOLN
20.0000 mg | Freq: Two times a day (BID) | INTRAMUSCULAR | Status: DC
  Filled 2018-11-12: qty 2

## 2018-11-12 NOTE — ED Notes (Signed)
Patient transported to X-ray 

## 2018-11-12 NOTE — ED Provider Notes (Signed)
Care transferred to me.  Patient's work-up is concerning for CHF.  This would be a recurrent issue from January.  Is in no distress but is tachypneic.  Will give IV Lasix and admit to the hospitalist service for further diuresis.  Dr. Tamala Julian will admit.   Sherwood Gambler, MD 11/12/18 [phone]

## 2018-11-12 NOTE — ED Notes (Signed)
purewick placed on pt.

## 2018-11-12 NOTE — ED Notes (Signed)
This RN attempted to notify daughter by phone that pt was going upstairs but received no answer.

## 2018-11-12 NOTE — ED Provider Notes (Signed)
TIME SEEN: 5:53 AM  CHIEF COMPLAINT: Shortness of breath  HPI: Patient is an 85 year old female with history of hypertension, CKD, dementia, chronic left popliteal DVT on Eliquis, previous left breast cancer status post radiation, grade 1 diastolic dysfunction not on diuretics who presents to the emergency department with her daughter with shortness of breath.  Daughter noticed that yesterday patient seemed to had increased work of breathing.  Tonight she checked the patient's oxygen saturation was 88% on room air.  They had oxygen at home that she was discharged with after she was admitted in February 2020 for acute respiratory failure secondary to community-acquired pneumonia.  Patient reports feeling better with oxygen and sats in the mid 90s.  No known fevers.  Patient denies any pain including chest discomfort.  No lower extremity swelling.  Daughter reports patient recently taken off of furosemide by her PCP.  No known sick contacts but family reports they did go to [facility] June 30 through July 3 with the patient.  Echo 05/24/2018:  Study Conclusions  - Left ventricle: The cavity size was normal. Wall thickness was   increased in a pattern of mild LVH. Systolic function was normal.   The estimated ejection fraction was in the range of 55% to 60%.   Wall motion was normal; there were no regional wall motion   abnormalities. Doppler parameters are consistent with abnormal   left ventricular relaxation (grade 1 diastolic dysfunction).   Doppler parameters are consistent with high ventricular filling   pressure. - Mitral valve: Calcified annulus. - Left atrium: The atrium was mildly dilated.  Impressions:  - Normal LV systolic function; mild diastolic dysfunction; mild   LVH; mild LAE.  ROS: Level 5 caveat secondary to dementia  PAST MEDICAL HISTORY/PAST SURGICAL HISTORY:  Past Medical History:  Diagnosis Date  . Breast cancer, left breast ([place]) ~ 2002   "had  radiation"  . Glaucoma   . History of gout   . Hypertension   . Migraine    "used to get them; nothing recently" (05/22/2018)  . Retinal hemorrhage   . Stroke [facility]) 2001   daughter denies residual on 05/22/2018    MEDICATIONS:  Prior to Admission medications   Medication Sig Start Date End Date Taking? Authorizing Provider  amLODipine (NORVASC) 10 MG tablet Take 1 tablet (10 mg total) by mouth daily. 05/25/18   Bloomfield, Carley D, DO  apixaban (ELIQUIS) 5 MG TABS tablet Take 1 tablet (5 mg total) by mouth 2 (two) times daily. 06/11/18   Isabelle Course, MD  furosemide (LASIX) 20 MG tablet Take 1 tablet (20 mg total) by mouth every other day. 06/14/18   Isabelle Course, MD  hydrALAZINE (APRESOLINE) 50 MG tablet Take 1 tablet (50 mg total) by mouth every 8 (eight) hours. 05/25/18   Bloomfield, Carley D, DO  nebivolol (BYSTOLIC) 10 MG tablet Take 10 mg by mouth daily.    [provider]  pravastatin (PRAVACHOL) 20 MG tablet Take 1 tablet (20 mg total) by mouth daily at 6 PM. 05/25/18   Bloomfield, Carley D, DO    ALLERGIES:  Allergies  Allergen Reactions  . Sulfa Antibiotics Other (See Comments)    Unknown reaction to pt    SOCIAL HISTORY:  Social History   Tobacco Use  . Smoking status: Former Smoker    Packs/day: 1.00    Years: 20.00    Pack years: 20.00    Types: Cigarettes  . Smokeless tobacco: Never Used  .  Tobacco comment: 05/22/2018 "quit smoking in the 1990s"  Substance Use Topics  . Alcohol use: Yes    Alcohol/week: 6.0 standard drinks    Types: 6 Shots of liquor per week    Comment: 05/22/2018 "straight vodka"    FAMILY HISTORY: Family History  Problem Relation Age of Onset  . ALS Sister     EXAM: BP (!) 176/90 (BP Location: Right Arm)   Pulse 66   Temp (!) 97.2 F (36.2 C) (Oral)   SpO2 100%  CONSTITUTIONAL: Alert, elderly, pleasantly demented HEAD: Normocephalic EYES: Conjunctivae clear, pupils appear equal, EOMI ENT: normal nose; moist mucous  membranes NECK: Supple, no meningismus, no nuchal rigidity, no LAD  CARD: RRR; S1 and S2 appreciated; no murmurs, no clicks, no rubs, no gallops RESP: Patient is tachypneic.  She is speaking short sentences.  She has increased work of breathing.  Hypoxic to 88% on room air at home.  Doing well on 2 L nasal cannula.  No rhonchi, wheezing or rales. ABD/GI: Normal bowel sounds; non-distended; soft, non-tender, no rebound, no guarding, no peritoneal signs, no hepatosplenomegaly BACK:  The back appears normal and is non-tender to palpation, there is no CVA tenderness EXT: Normal ROM in all joints; non-tender to palpation; no edema; normal capillary refill; no cyanosis, no calf tenderness or swelling    SKIN: Normal color for age and race; warm; no rash NEURO: Moves all extremities equally PSYCH: The patient's mood and manner are appropriate. Grooming and personal hygiene are appropriate.  MEDICAL DECISION MAKING: Patient here shortness of breath.  Differential includes pneumonia, coronavirus, PE, CHF.  Doubt ACS.  EKG shows no ischemic abnormality.  Will check rectal temperature and obtain labs, chest x-ray.  Anticipate patient will need admission.  Daughter at bedside has been updated with plan.  ED PROGRESS: Patient has a leukocytosis of 13,000 with left shift.  Rectal temp is 99.3.  Lactate normal.  Age-adjusted d-dimer is negative.  Her coronavirus test is pending.  I feel she will need admission given hypoxia and increased work of breathing.  Signed out to oncoming ED physician.  I reviewed all nursing notes, vitals, pertinent previous records, EKGs, lab and urine results, imaging (as available).   Danniell Morrow was evaluated in Emergency Department on 11/12/2018 for the symptoms described in the history of present illness. She was evaluated in the context of the global COVID-19 pandemic, which necessitated consideration that the patient might be at risk for infection with the SARS-CoV-2 virus  that causes COVID-19. Institutional protocols and algorithms that pertain to the evaluation of patients at risk for COVID-19 are in a state of rapid change based on information released by regulatory bodies including the CDC and federal and state organizations. These policies and algorithms were followed during the patient's care in the ED.      EKG Interpretation  Date/Time:  Monday November 12 2018 05:33:49 EDT Ventricular Rate:  64 PR Interval:    QRS Duration: 90 QT Interval:  448 QTC Calculation: 463 R Axis:   66 Text Interpretation:  Sinus or ectopic atrial rhythm Atrial premature complexes ProbableLVH with secondary repol abnrm No significant change since last tracing Reconfirmed by Inas Avena, Cyril Mourning [phone]) on 11/12/2018 5:52:22 AM         Maston Wight, Delice Bison, DO 11/12/18 1017

## 2018-11-12 NOTE — TOC Initial Note (Signed)
Transition of Care [facility]) - Initial/Assessment Note    Patient Details  Name: Rhonda Sharp MRN: [id number] Date of Birth: 07/20/1928  [facility] [place] CM/SW Contact:    Fuller Mandril, RN Phone Number: 11/12/2018, 3:01 PM  Clinical Narrative:                 [facility] consulted regarding heart failure home health screen and may place order for PT/OT eval and treat if indicated order. Expected Discharge Plan: Home/Self Care Barriers to Discharge: Continued Medical Work up   Patient Goals and CMS Choice        Expected Discharge Plan and Services Expected Discharge Plan: Home/Self Care   Discharge Planning Services: CM Consult   Living arrangements for the past 2 months: Single Family Home                                      Prior Living Arrangements/Services Living arrangements for the past 2 months: Single Family Home Lives with:: Adult Children   Do you feel safe going back to the place where you live?: Yes          Current home services: DME    Activities of Daily Living      Permission Sought/Granted Permission sought to share information with : Case Manager, Family Supports                Emotional Assessment Appearance:: Appears stated age Attitude/Demeanor/Rapport: Gracious Affect (typically observed): Appropriate Orientation: : Oriented to Self, Oriented to Place, Oriented to  Time, Oriented to Situation Alcohol / Substance Use: (drinks occasionaly) Psych Involvement: No (comment)  Admission diagnosis:  SOB Patient Active Problem List   Diagnosis Date Noted  . Leukocytosis 11/12/2018  . Acute exacerbation of CHF (congestive heart failure) ([place]) 11/12/2018  . CKD (chronic kidney disease), stage III ([place]) 11/12/2018  . Dementia ([place]) 11/12/2018  . Subacute confusional state   . CHF (congestive heart failure), NYHA class I, acute, systolic ([place])   . Pleural effusion, bilateral   . CAP (community acquired pneumonia) 06/06/2018   . Chronic deep vein thrombosis (DVT) of popliteal vein of left lower extremity ([place]) 06/06/2018  . Acute respiratory failure with hypoxia ([place]) 06/05/2018  . Thrombocytopenia ([place]) 05/24/2018  . Alcohol abuse, daily use 05/24/2018  . AKI (acute kidney injury) ([place]) 05/24/2018  . Cerebral thrombosis with cerebral infarction 05/23/2018  . Cerebrovascular accident (CVA) ([place])   . Acute encephalopathy 05/22/2018  . Essential hypertension 05/22/2018   PCP:  Haywood Pao, MD Pharmacy:   [facility] [place], [place] - [address] [place] [address] [place] [address] [place] [address] Phone: [phone] Fax: [phone]     Social Determinants of Health (SDOH) Interventions    Readmission Risk Interventions No flowsheet data found.

## 2018-11-12 NOTE — Progress Notes (Signed)
Echocardiogram 2D Echocardiogram has been performed.  Rhonda Sharp 11/12/2018, 11:21 AM

## 2018-11-12 NOTE — H&P (Addendum)
History and Physical    [name] UDJ:[id number] DOB: 20-Jul-1928 DOA: 11/12/2018  Referring MD/NP/PA: Crist Infante, MD PCP: Haywood Pao, MD  Patient coming from: Home  Chief Complaint: Shortness of breath  I have personally briefly reviewed patient's old medical records in [place]   HPI: Rhonda Sharp is a 85 y.o. female with medical history significant of hypertension, CVA, HLD, chronic kidney disease stage III, diastolic dysfunction, DVT on Eliquis, mild dementia, and breast cancer s/p radiation; who presents with complaints of shortness of breath since yesterday.  She reported feeling short of breath while moving around yesterday evening.  Associated symptoms of palpitations, rhinorrhea, dry cough, ankle swelling, and generalized malaise.  Given her some Zyrtec with improvement of rhinorrhea. Patient's daughter notes that she has been more tired here lately and it has been harder for her to get up and move around.  This morning around 3 AM patient's O2 saturations reported to be as low as 88% on room air when checked by family.  She was placed on oxygen that she received during her last admission with improvement of O2 saturation greater than 92%.  She had just recently went to the beach last week and had tripped on a walkway with all of their falling onto her right knee.  Denies any fever, loss of consciousness, chest pain, abdominal pain, nausea, vomiting, diarrhea, or dysuria symptoms.  Patient was admitted into the hospital twice in January.  During the first admission patient was noted to be altered with hypertensive urgency and ultimately to have small acute CVA of the left temporal cortex.  During the secondary acute respiratory failure secondary to community-acquired pneumonia with diastolic heart failure and newly found left leg DVT started on Eliquis.  Patient has been taking all her medications as prescribed including Eliquis.  She does not smoke and no  longer drinks alcohol on daily occurrence.  Last noted to drink alcohol during last week at the beach.  Family reports that patient memory is fair and during her last admission she was noted to be altered and possibly having sundowning.  ED Course: Upon admission into the emergency department patient was noted to be afebrile, pulse 57-66, respirations 22-23, blood pressure 160/91-176/90, and O2 saturations maintained on room air.  Labs revealed WBC 13.3, hemoglobin 10.7, platelets 137, BUN 36, creatinine 1.66, AST 64, ALT 66, BNP 984.4, pro calcitonin <0.1, CRP 2.9, lactic acid 0.9, and d-dimer 0.78.  Chest x-ray revealed cardiomegaly with pulmonary vascular congestion and improved bibasilar infiltrates/edema.  Patient was given 20 mg of Lasix IV and TRH called to admit.  Review of Systems  Constitutional: Positive for malaise/fatigue. Negative for fever.  HENT: Negative for nosebleeds.        Positive for rhinorrhea  Eyes: Negative for double vision and photophobia.  Respiratory: Positive for cough, shortness of breath and wheezing.   Cardiovascular: Positive for palpitations and leg swelling. Negative for chest pain.  Gastrointestinal: Negative for abdominal pain, nausea and vomiting.  Genitourinary: Negative for dysuria and frequency.  Musculoskeletal: Negative for back pain and falls.  Skin: Negative for itching.  Neurological: Negative for focal weakness and loss of consciousness.  Endo/Heme/Allergies: Positive for environmental allergies.  Psychiatric/Behavioral: Positive for memory loss. Negative for substance abuse.    Past Medical History:  Diagnosis Date   Breast cancer, left breast ([place]) ~ 2002   "had radiation"   Glaucoma    History of gout    Hypertension    Migraine    "  used to get them; nothing recently" (05/22/2018)   Retinal hemorrhage    Stroke [facility]) 2001   daughter denies residual on 05/22/2018    Past Surgical History:  Procedure Laterality Date    APPENDECTOMY     AXILLARY LYMPH NODE DISSECTION Left ~ 2002   BREAST BIOPSY Left ~ 2002   EYE SURGERY Left 05/2018   "laser OR for bleed"   TUBAL LIGATION       reports that she has quit smoking. Her smoking use included cigarettes. She has a 20.00 pack-year smoking history. She has never used smokeless tobacco. She reports current alcohol use of about 6.0 standard drinks of alcohol per week. She reports that she does not use drugs.  Allergies  Allergen Reactions   Sulfa Antibiotics Other (See Comments)    Unknown reaction to pt    Family History  Problem Relation Age of Onset   ALS Sister     Prior to Admission medications   Medication Sig Start Date End Date Taking? Authorizing Provider  amLODipine (NORVASC) 10 MG tablet Take 1 tablet (10 mg total) by mouth daily. 05/25/18   Bloomfield, Carley D, DO  apixaban (ELIQUIS) 5 MG TABS tablet Take 1 tablet (5 mg total) by mouth 2 (two) times daily. 06/11/18   Isabelle Course, MD  furosemide (LASIX) 20 MG tablet Take 1 tablet (20 mg total) by mouth every other day. 06/14/18   Isabelle Course, MD  hydrALAZINE (APRESOLINE) 50 MG tablet Take 1 tablet (50 mg total) by mouth every 8 (eight) hours. 05/25/18   Bloomfield, Carley D, DO  nebivolol (BYSTOLIC) 10 MG tablet Take 10 mg by mouth daily.    [provider]  pravastatin (PRAVACHOL) 20 MG tablet Take 1 tablet (20 mg total) by mouth daily at 6 PM. 05/25/18   Modena Nunnery D, DO    Physical Exam:  Constitutional: Elderly female currently in NAD, calm, comfortable Vitals:   11/12/18 0530 11/12/18 0545 11/12/18 0557 11/12/18 0700  BP: (!) 160/91 (!) 166/79  (!) 173/75  Pulse: 63 66  (!) 57  Resp: (!) 23 (!) 23  (!) 22  Temp:   99.3 F (37.4 C)   TempSrc:   Rectal   SpO2: 96% 95%  99%   Eyes: PERRL, lids and conjunctivae normal ENMT: Mucous membranes are moist. Posterior pharynx clear of any exudate or lesions.  Neck: normal, supple, no masses, no thyromegaly.  Subtle  JVD Respiratory: Normal respiratory effort with bibasilar crackles appreciated.  No significant wheezes or rhonchi. Cardiovascular: Regular rate and rhythm, no murmurs / rubs / gallops.  Trace lower extremity edema. 2+ pedal pulses. No carotid bruits.  Abdomen: no tenderness, no masses palpated. No hepatosplenomegaly. Bowel sounds positive.  Musculoskeletal: no clubbing / cyanosis. No joint deformity upper and lower extremities. Good ROM, no contractures. Normal muscle tone.  Skin: no rashes, lesions, ulcers. No induration Neurologic: CN 2-12 grossly intact. Sensation intact, DTR normal. Strength 5/5 in all 4.  Psychiatric: Normal judgment and insight. Alert and oriented x 3. Normal mood.     Labs on Admission: I have personally reviewed following labs and imaging studies  CBC: Recent Labs  Lab 11/12/18 0547  WBC 13.3*  NEUTROABS 10.8*  HGB 10.7*  HCT 33.5*  MCV 94.4  PLT 888*   Basic Metabolic Panel: Recent Labs  Lab 11/12/18 0547  NA 141  K 4.6  CL 111  CO2 18*  GLUCOSE 131*  BUN 36*  CREATININE 1.66*  CALCIUM  9.5   GFR: CrCl cannot be calculated (Unknown ideal weight.). Liver Function Tests: Recent Labs  Lab 11/12/18 0547  AST 64*  ALT 66*  ALKPHOS 97  BILITOT 1.1  PROT 7.3  ALBUMIN 3.9   No results for input(s): LIPASE, AMYLASE in the last 168 hours. No results for input(s): AMMONIA in the last 168 hours. Coagulation Profile: No results for input(s): INR, PROTIME in the last 168 hours. Cardiac Enzymes: No results for input(s): CKTOTAL, CKMB, CKMBINDEX, TROPONINI in the last 168 hours. BNP (last 3 results) No results for input(s): PROBNP in the last 8760 hours. HbA1C: No results for input(s): HGBA1C in the last 72 hours. CBG: No results for input(s): GLUCAP in the last 168 hours. Lipid Profile: Recent Labs    11/12/18 0547  TRIG 74   Thyroid Function Tests: No results for input(s): TSH, T4TOTAL, FREET4, T3FREE, THYROIDAB in the last 72  hours. Anemia Panel: Recent Labs    11/12/18 0547  FERRITIN 96   Urine analysis:    Component Value Date/Time   COLORURINE YELLOW 05/22/2018 0627   APPEARANCEUR CLEAR 05/22/2018 0627   LABSPEC 1.012 05/22/2018 0627   PHURINE 6.0 05/22/2018 0627   GLUCOSEU NEGATIVE 05/22/2018 0627   HGBUR SMALL (A) 05/22/2018 0627   BILIRUBINUR NEGATIVE 05/22/2018 [place] 05/22/2018 0627   PROTEINUR 30 (A) 05/22/2018 0627   NITRITE NEGATIVE 05/22/2018 0627   LEUKOCYTESUR TRACE (A) 05/22/2018 0627   Sepsis Labs: Recent Results (from the past 240 hour(s))  SARS Coronavirus 2 [facility] order, Performed in [place] hospital lab)     Status: None   Collection Time: 11/12/18  6:05 AM   Specimen: Nasopharyngeal Swab  Result Value Ref Range Status   SARS Coronavirus 2 NEGATIVE NEGATIVE Final    Comment: (NOTE) If result is NEGATIVE SARS-CoV-2 target nucleic acids are NOT DETECTED. The SARS-CoV-2 RNA is generally detectable in upper and lower  respiratory specimens during the acute phase of infection. The lowest  concentration of SARS-CoV-2 viral copies this assay can detect is 250  copies / mL. A negative result does not preclude SARS-CoV-2 infection  and should not be used as the sole basis for treatment or other  patient management decisions.  A negative result may occur with  improper specimen collection / handling, submission of specimen other  than nasopharyngeal swab, presence of viral mutation(s) within the  areas targeted by this assay, and inadequate number of viral copies  (<250 copies / mL). A negative result must be combined with clinical  observations, patient history, and epidemiological information. If result is POSITIVE SARS-CoV-2 target nucleic acids are DETECTED. The SARS-CoV-2 RNA is generally detectable in upper and lower  respiratory specimens dur ing the acute phase of infection.  Positive  results are indicative of active infection with SARS-CoV-2.   Clinical  correlation with patient history and other diagnostic information is  necessary to determine patient infection status.  Positive results do  not rule out bacterial infection or co-infection with other viruses. If result is PRESUMPTIVE POSTIVE SARS-CoV-2 nucleic acids MAY BE PRESENT.   A presumptive positive result was obtained on the submitted specimen  and confirmed on repeat testing.  While 2019 novel coronavirus  (SARS-CoV-2) nucleic acids may be present in the submitted sample  additional confirmatory testing may be necessary for epidemiological  and / or clinical management purposes  to differentiate between  SARS-CoV-2 and other Sarbecovirus currently known to infect humans.  If clinically indicated additional testing with  an alternate test  methodology [phone]) is advised. The SARS-CoV-2 RNA is generally  detectable in upper and lower respiratory sp ecimens during the acute  phase of infection. The expected result is Negative. Fact Sheet for Patients:  StrictlyIdeas.no Fact Sheet for Healthcare Providers: BankingDealers.co.za This test is not yet approved or cleared by the [place] FDA and has been authorized for detection and/or diagnosis of SARS-CoV-2 by FDA under an Emergency Use Authorization (EUA).  This EUA will remain in effect (meaning this test can be used) for the duration of the COVID-19 declaration under Section 564(b)(1) of the Act, 21 U.S.C. section 360bbb-3(b)(1), unless the authorization is terminated or revoked sooner. Performed at [facility], [place] [address] [place], [place] [address]      Radiological Exams on Admission: Dg Chest Portable 1 View  Result Date: 11/12/2018 CLINICAL DATA:  Shortness of breath. EXAM: PORTABLE CHEST 1 VIEW COMPARISON:  06/12/2018.  06/10/2018.  CT 06/05/2018. FINDINGS: Cardiomegaly with pulmonary venous congestion. Bibasilar infiltrates/edema has improved from  prior exam. Residual basilar interstitial prominence noted. Mild biapical pleural thickening consistent scarring again noted. No pleural effusion or pneumothorax. IMPRESSION: 1.  Cardiomegaly with pulmonary venous congestion. 2. Bibasilar infiltrates/edema is improved from prior exam. Residual basilar interstitial prominence noted. Electronically Signed   By: Marcello Moores  Register   On: 11/12/2018 05:55    EKG: Independently reviewed. Ectopic atrial rhythm at 64 bpm  Assessment/Plan Acute respiratory failure with hypoxia secondary to diastolic congestive heart failure exacerbation: Patient presents with shortness of breath and a dry cough.  At home O2 saturations as low as 88% improved with nasal cannula oxygen.  Chest x-ray showing cardiomegaly with vascular congestion with BNP 984.4 which is similar to previous hospitalization in January. Echocardiogram in January revealed EF of 55 to 60% with grade 1 diastolic dysfunction. - Admit to a telemetry bed - Heart failure orders set  initiated  - Continuous pulse oximetry with nasal cannula oxygen as needed to keep O2 saturations >92% - Check TSH and add-on high-sensitivity troponin - Strict I&Os and daily weights - Elevate lower extremities - Lasix 20 mg IV Bid - Reassess in a.m. and adjust diuresis as needed. - Check echocardiogram - Optimize medical management as able - Cardiology formally consulted  Palpitations/abnormal EKG: On physical exam patient's heart rate appears to be irregular.  EKG showing ectopic atrial rhythm.  Questioned if atrial fibrillation. -Continue to monitor and follow-up telemetry  Leukocytosis: Acute.  WBC elevated at 13.3 and patient complains of dry cough which could be associated with CHF exacerbation.  SIRS criteria met on admission, but no clear source of infection noted at this time and lactic acid reassuring.  Chest x-ray also noted bibasilar infiltrates/edema that were improved from previous x-rays and January.   Procalcitonin <0.1 for which antibiotics would not be recommended to be started for possible pneumonia. Although CRP is elevated at 2.9.  Question possible viral upper respiratory infection.  COVID-19 testing was negative. -Follow-up blood culture -Check urinalysis  -Repeat CBC in a.m.  Essential hypertension: Blood pressures elevated up to 176/90.  Home blood pressure medications include Bystolic 10 mg daily, hydralazine 50 mg 3 times daily, Lasix 20 mg every other day, and amlodipine 10 mg daily. -Continue amlodipine, hydralazine, Bystolic as tolerated,  Normocytic normochromic anemia: Chronic.  Hemoglobin 10.7 on admission which appears near patient's baseline. -Repeat CBC in a.m.  History of DVT on chronic anticoagulation: Noted to a left popliteal DVT during last admission in January of this  year.  Patient has been continued on Eliquis. -Continue Eliquis  Chronic kidney disease stage III/IV: Patient presents with creatinine 1.66 with BUN 36 which appears slightly increased from baseline. -Monitor BMP daily while diuresing  Hyperlipidemia -Continue pravastatin  History of CVA: Patient noted to have tiny acute ischemic infarcts of the left temporal cortex on MRI in January of this year.  No residual deficits noted. -Continue Eliquis and statin  Question of dementia: Patient with history of suspect dementia.  Daughter reports patient being altered during last admissions after prolonged stay.  Question dementia with sundowning versus delirium versus alcohol withdrawal. -Continue to monitor  Elevated liver enzymes: AST and ALT mildly elevated at 64 and 66 respectively.  Patient recently reported drinking over the weekend, but no longer drinks on a daily basis.  Other causes could include passive congestion related with CHF. -May benefit from follow-up testing in outpatient setting  Thrombocytopenia: Chronic.  Platelet count 137 on admission.  Suspect previous history of alcohol  abuse. -Continue to monitor  DVT prophylaxis: Eliquis Code Status: Full Family Communication: Discussed plan of care with the patient and family present bedside Disposition Plan: Likely discharge home in 1 to 2 days Consults called: Cardiology Admission status: Observation  Norval Morton MD [facility] Pager [phone]   If 7PM-7AM, please contact night-coverage [email or web address] Password [email or web address]  11/12/2018, 8:06 AM

## 2018-11-12 NOTE — ED Triage Notes (Signed)
Pt reports she started feeling SOB after dinner last night.  Per family when pt woke up she was at 88% on RA.  Pt has dry cough and a runny nose.

## 2018-11-12 NOTE — ED Notes (Signed)
ED TO INPATIENT HANDOFF REPORT  ED Nurse Name and Phone #:  Barney Drain   S Name/Age/Gender Rhonda Sharp 85 y.o. female Room/Bed: 014C/014C  Code Status   Code Status: Full Code  Home/SNF/Other Home Patient oriented to: self, place and situation Is this baseline? Yes   Triage Complete: Triage complete  Chief Complaint SOB  Triage Note Pt reports she started feeling SOB after dinner last night.  Per family when pt woke up she was at 88% on RA.  Pt has dry cough and a runny nose.     Allergies Allergies  Allergen Reactions  . Sulfa Antibiotics Other (See Comments)    Unknown reaction to pt    Level of Care/Admitting Diagnosis ED Disposition    ED Disposition Condition [facility]: [place] [[id number]]  Level of Care: Telemetry Cardiac [103]  I expect the patient will be discharged within 24 hours: No (not a candidate for 5C-Observation unit)  Covid Evaluation: Confirmed COVID Negative  Diagnosis: Acute respiratory failure with hypoxia [facility]) [[id number]]  Admitting Physician: Norval Morton [[id number]]  Attending Physician: Norval Morton [[id number]]  PT Class (Do Not Modify): Observation [104]  PT Acc Code (Do Not Modify): Observation [10022]       B Medical/Surgery History Past Medical History:  Diagnosis Date  . Breast cancer, left breast ([place]) ~ 2002   "had radiation"  . Glaucoma   . History of gout   . Hypertension   . Migraine    "used to get them; nothing recently" (05/22/2018)  . Retinal hemorrhage   . Stroke [facility]) 2001   daughter denies residual on 05/22/2018   Past Surgical History:  Procedure Laterality Date  . APPENDECTOMY    . AXILLARY LYMPH NODE DISSECTION Left ~ 2002  . BREAST BIOPSY Left ~ 2002  . EYE SURGERY Left 05/2018   "laser OR for bleed"  . TUBAL LIGATION       A IV Location/Drains/Wounds Patient Lines/Drains/Airways Status   Active Line/Drains/Airways    Name:   Placement date:    Placement time:   Site:   Days:   Peripheral IV 11/12/18 Right Antecubital   11/12/18    0543    Antecubital   less than 1   Midline Single Lumen 06/09/18 PICC Right Brachial 8 cm   06/09/18    1624    Brachial   156   Wound / Incision (Open or Dehisced) 06/13/18 Other (Comment) Arm Right;Posterior skin open ,bloody center   06/13/18    0730    Arm   152          Intake/Output Last 24 hours  Intake/Output Summary (Last 24 hours) at 11/12/2018 1500 Last data filed at 11/12/2018 1411 Gross per 24 hour  Intake -  Output 900 ml  Net -900 ml    Labs/Imaging Results for orders placed or performed during the hospital encounter of 11/12/18 (from the past 48 hour(s))  Lactic acid, plasma     Status: None   Collection Time: 11/12/18  5:47 AM  Result Value Ref Range   Lactic Acid, Venous 0.9 0.5 - 1.9 mmol/L    Comment: Performed at [facility], [address], [place], [place] [address]  CBC WITH DIFFERENTIAL     Status: Abnormal   Collection Time: 11/12/18  5:47 AM  Result Value Ref Range   WBC 13.3 (H) 4.0 - 10.5 K/uL   RBC 3.55 (L) 3.87 -  5.11 MIL/uL   Hemoglobin 10.7 (L) 12.0 - 15.0 g/dL   HCT 33.5 (L) 36.0 - 46.0 %   MCV 94.4 80.0 - 100.0 fL   MCH 30.1 26.0 - 34.0 pg   MCHC 31.9 30.0 - 36.0 g/dL   RDW 14.3 11.5 - 15.5 %   Platelets 137 (L) 150 - 400 K/uL    Comment: REPEATED TO VERIFY   nRBC 0.0 0.0 - 0.2 %   Neutrophils Relative % 81 %   Neutro Abs 10.8 (H) 1.7 - 7.7 K/uL   Lymphocytes Relative 7 %   Lymphs Abs 1.0 0.7 - 4.0 K/uL   Monocytes Relative 9 %   Monocytes Absolute 1.2 (H) 0.1 - 1.0 K/uL   Eosinophils Relative 1 %   Eosinophils Absolute 0.1 0.0 - 0.5 K/uL   Basophils Relative 1 %   Basophils Absolute 0.1 0.0 - 0.1 K/uL   Immature Granulocytes 1 %   Abs Immature Granulocytes 0.12 (H) 0.00 - 0.07 K/uL    Comment: Performed at [facility], [address] [place], [place] [address]  Comprehensive metabolic panel     Status: Abnormal   Collection  Time: 11/12/18  5:47 AM  Result Value Ref Range   Sodium 141 135 - 145 mmol/L   Potassium 4.6 3.5 - 5.1 mmol/L   Chloride 111 98 - 111 mmol/L   CO2 18 (L) 22 - 32 mmol/L   Glucose, Bld 131 (H) 70 - 99 mg/dL   BUN 36 (H) 8 - 23 mg/dL   Creatinine, Ser 1.66 (H) 0.44 - 1.00 mg/dL   Calcium 9.5 8.9 - 10.3 mg/dL   Total Protein 7.3 6.5 - 8.1 g/dL   Albumin 3.9 3.5 - 5.0 g/dL   AST 64 (H) 15 - 41 U/L   ALT 66 (H) 0 - 44 U/L   Alkaline Phosphatase 97 38 - 126 U/L   Total Bilirubin 1.1 0.3 - 1.2 mg/dL   GFR calc non Af Amer 27 (L) >60 mL/min   GFR calc Af Amer 32 (L) >60 mL/min   Anion gap 12 5 - 15    Comment: Performed at [facility], [place] [address] [place], [place] [address]  D-dimer, quantitative     Status: Abnormal   Collection Time: 11/12/18  5:47 AM  Result Value Ref Range   D-Dimer, Quant 0.78 (H) 0.00 - 0.50 ug/mL-FEU    Comment: (NOTE) At the manufacturer cut-off of 0.50 ug/mL FEU, this assay has been documented to exclude PE with a sensitivity and negative predictive value of 97 to 99%.  At this time, this assay has not been approved by the FDA to exclude DVT/VTE. Results should be correlated with clinical presentation. Performed at [facility], [place] [address], [place], [place] [address]   Procalcitonin     Status: None   Collection Time: 11/12/18  5:47 AM  Result Value Ref Range   Procalcitonin <0.10 ng/mL    Comment:        Interpretation: PCT (Procalcitonin) <= 0.5 ng/mL: Systemic infection (sepsis) is not likely. Local bacterial infection is possible. (NOTE)       Sepsis PCT Algorithm           Lower Respiratory Tract                                      Infection PCT Algorithm    ----------------------------     ----------------------------  PCT < 0.25 ng/mL                PCT < 0.10 ng/mL         Strongly encourage             Strongly discourage   discontinuation of antibiotics    initiation of antibiotics     ----------------------------     -----------------------------       PCT 0.25 - 0.50 ng/mL            PCT 0.10 - 0.25 ng/mL               OR       >80% decrease in PCT            Discourage initiation of                                            antibiotics      Encourage discontinuation           of antibiotics    ----------------------------     -----------------------------         PCT >= 0.50 ng/mL              PCT 0.26 - 0.50 ng/mL               AND        <80% decrease in PCT             Encourage initiation of                                             antibiotics       Encourage continuation           of antibiotics    ----------------------------     -----------------------------        PCT >= 0.50 ng/mL                  PCT > 0.50 ng/mL               AND         increase in PCT                  Strongly encourage                                      initiation of antibiotics    Strongly encourage escalation           of antibiotics                                     -----------------------------                                           PCT <= 0.25 ng/mL  OR                                        > 80% decrease in PCT                                     Discontinue / Do not initiate                                             antibiotics Performed at [facility], [place] [address], [place], [place] [address]   Lactate dehydrogenase     Status: Abnormal   Collection Time: 11/12/18  5:47 AM  Result Value Ref Range   LDH 220 (H) 98 - 192 U/L    Comment: Performed at [facility], [place] [address], [place], [place] [address]  Ferritin     Status: None   Collection Time: 11/12/18  5:47 AM  Result Value Ref Range   Ferritin 96 11 - 307 ng/mL    Comment: Performed at [facility], [place] [address], [place], [place] [address]  Triglycerides     Status: None   Collection Time: 11/12/18  5:47 AM  Result Value Ref  Range   Triglycerides 74 <150 mg/dL    Comment: Performed at [facility] [address], [place], [place] [address]  Fibrinogen     Status: Abnormal   Collection Time: 11/12/18  5:47 AM  Result Value Ref Range   Fibrinogen 531 (H) 210 - 475 mg/dL    Comment: Performed at [facility] [address] [place], [place] [address]  C-reactive protein     Status: Abnormal   Collection Time: 11/12/18  5:47 AM  Result Value Ref Range   CRP 2.9 (H) <1.0 mg/dL    Comment: Performed at [facility] [address], [place], [place] [address]  Brain natriuretic peptide     Status: Abnormal   Collection Time: 11/12/18  5:48 AM  Result Value Ref Range   B Natriuretic Peptide 984.4 (H) 0.0 - 100.0 pg/mL    Comment: Performed at [facility] [address] [place], [place] [address]  SARS Coronavirus 2 [facility] order, Performed in [facility] hospital lab)     Status: None   Collection Time: 11/12/18  6:05 AM   Specimen: Nasopharyngeal Swab  Result Value Ref Range   SARS Coronavirus 2 NEGATIVE NEGATIVE    Comment: (NOTE) If result is NEGATIVE SARS-CoV-2 target nucleic acids are NOT DETECTED. The SARS-CoV-2 RNA is generally detectable in upper and lower  respiratory specimens during the acute phase of infection. The lowest  concentration of SARS-CoV-2 viral copies this assay can detect is 250  copies / mL. A negative result does not preclude SARS-CoV-2 infection  and should not be used as the sole basis for treatment or other  patient management decisions.  A negative result may occur with  improper specimen collection / handling, submission of specimen other  than nasopharyngeal swab, presence of viral mutation(s) within the  areas targeted by this assay, and inadequate number of viral copies  (<250 copies / mL). A negative result must be combined  with clinical  observations, patient history, and epidemiological information. If result is POSITIVE SARS-CoV-2 target  nucleic acids are DETECTED. The SARS-CoV-2 RNA is generally detectable in upper and lower  respiratory specimens dur ing the acute phase of infection.  Positive  results are indicative of active infection with SARS-CoV-2.  Clinical  correlation with patient history and other diagnostic information is  necessary to determine patient infection status.  Positive results do  not rule out bacterial infection or co-infection with other viruses. If result is PRESUMPTIVE POSTIVE SARS-CoV-2 nucleic acids MAY BE PRESENT.   A presumptive positive result was obtained on the submitted specimen  and confirmed on repeat testing.  While 2019 novel coronavirus  (SARS-CoV-2) nucleic acids may be present in the submitted sample  additional confirmatory testing may be necessary for epidemiological  and / or clinical management purposes  to differentiate between  SARS-CoV-2 and other Sarbecovirus currently known to infect humans.  If clinically indicated additional testing with an alternate test  methodology [phone]) is advised. The SARS-CoV-2 RNA is generally  detectable in upper and lower respiratory sp ecimens during the acute  phase of infection. The expected result is Negative. Fact Sheet for Patients:  StrictlyIdeas.no Fact Sheet for Healthcare Providers: BankingDealers.co.za This test is not yet approved or cleared by the [place] FDA and has been authorized for detection and/or diagnosis of SARS-CoV-2 by FDA under an Emergency Use Authorization (EUA).  This EUA will remain in effect (meaning this test can be used) for the duration of the COVID-19 declaration under Section 564(b)(1) of the Act, 21 U.S.C. section 360bbb-3(b)(1), unless the authorization is terminated or revoked sooner. Performed at [facility], [place] [address] [place], [place] [address]   Urinalysis, Routine w reflex microscopic     Status: Abnormal   Collection Time:  11/12/18 11:18 AM  Result Value Ref Range   Color, Urine YELLOW YELLOW   APPearance CLEAR CLEAR   Specific Gravity, Urine 1.008 1.005 - 1.030   pH 5.0 5.0 - 8.0   Glucose, UA NEGATIVE NEGATIVE mg/dL   Hgb urine dipstick NEGATIVE NEGATIVE   Bilirubin Urine NEGATIVE NEGATIVE   Ketones, ur NEGATIVE NEGATIVE mg/dL   Protein, ur 30 (A) NEGATIVE mg/dL   Nitrite NEGATIVE NEGATIVE   Leukocytes,Ua LARGE (A) NEGATIVE   RBC / HPF 0-5 0 - 5 RBC/hpf   WBC, UA 0-5 0 - 5 WBC/hpf   Bacteria, UA NONE SEEN NONE SEEN   Mucus PRESENT     Comment: Performed at [facility] [address] [place], [place] [address]   Dg Chest Portable 1 View  Result Date: 11/12/2018 CLINICAL DATA:  Shortness of breath. EXAM: PORTABLE CHEST 1 VIEW COMPARISON:  06/12/2018.  06/10/2018.  CT 06/05/2018. FINDINGS: Cardiomegaly with pulmonary venous congestion. Bibasilar infiltrates/edema has improved from prior exam. Residual basilar interstitial prominence noted. Mild biapical pleural thickening consistent scarring again noted. No pleural effusion or pneumothorax. IMPRESSION: 1.  Cardiomegaly with pulmonary venous congestion. 2. Bibasilar infiltrates/edema is improved from prior exam. Residual basilar interstitial prominence noted. Electronically Signed   By: Marcello Moores  Register   On: 11/12/2018 05:55    Pending Labs Unresulted Labs (From admission, onward)    Start     Ordered   11/13/18 1093  Basic metabolic panel  Daily,   R     11/12/18 0851   11/13/18 0500  CBC with Differential/Platelet  Tomorrow morning,   R     11/12/18 0902   11/12/18 2355  Troponin I (High Sensitivity)  Add-on,   AD    Question Answer Comment  Indication Other   Specify indication chf      11/12/18 0907   11/12/18 0547  Blood Culture (routine x 2)  BLOOD CULTURE X 2,   STAT     11/12/18 0547          Vitals/Pain Today's Vitals   11/12/18 1000 11/12/18 1030 11/12/18 1115 11/12/18 1200  BP:  (!) 169/64 (!) 164/65 (!) 160/83  Pulse:    64   Resp:  16 (!) 23 14  Temp:      TempSrc:      SpO2:   98%   Weight: 65 kg     Height: 5\' 7"  (1.702 m)     PainSc:        Isolation Precautions No active isolations  Medications Medications  hydrALAZINE (APRESOLINE) tablet 50 mg (50 mg Oral Given 11/12/18 1458)  pravastatin (PRAVACHOL) tablet 20 mg (has no administration in time range)  apixaban (ELIQUIS) tablet 5 mg (5 mg Oral Given 11/12/18 1135)  sodium chloride flush (NS) 0.9 % injection 3 mL (3 mLs Intravenous Given 11/12/18 1136)  sodium chloride flush (NS) 0.9 % injection 3 mL (has no administration in time range)  0.9 %  sodium chloride infusion (has no administration in time range)  acetaminophen (TYLENOL) tablet 650 mg (has no administration in time range)  ondansetron (ZOFRAN) injection 4 mg (has no administration in time range)  furosemide (LASIX) injection 20 mg (has no administration in time range)  amLODipine (NORVASC) tablet 10 mg (10 mg Oral Given 11/12/18 1135)  nebivolol (BYSTOLIC) tablet 10 mg (10 mg Oral Given 11/12/18 1228)  guaiFENesin (MUCINEX) 12 hr tablet 600 mg (600 mg Oral Given 11/12/18 1136)  furosemide (LASIX) injection 20 mg (20 mg Intravenous Given 11/12/18 0737)    Mobility walks with person assist High fall risk   Focused Assessments    R Recommendations: See Admitting Provider Note  Report given to:   Additional Notes:

## 2018-11-12 NOTE — Consult Note (Addendum)
Cardiology Consultation:   Patient ID: Rhonda Sharp MRN: [id number]; DOB: 07/23/1928  Admit date: 11/12/2018 Date of Consult: 11/12/2018  Primary Care Provider: Haywood Pao, MD Primary Cardiologist: New to Dr. Marlou Porch   Patient Profile:   Rhonda Sharp is a 85 y.o. female with a hx of labile hypertension, CVA, DVT on Eliquis, diastolic dysfunction, CKD stage III, hyperlipidemia, breast cancer status post radiation and mild dementia who is being seen today for the evaluation of CHF at the request of Dr. Tamala Julian.  Admitted January 2020 with more acute small CVA secondary to hypertensive urgency.  Admitted February 2020 with acute hypoxic respiratory failure secondary to community-acquired pneumonia and diastolic heart failure.  She was started on Eliquis for left leg DVT.  She was discharged on Lasix however discontinued approximately 8 weeks ago by PCP secondary to hypotension along with reduce hydralazine.  Has a history of binge alcohol drinking however drinks socially since January admission.   History of Present Illness:   Rhonda Sharp presented with acute onset shortness of breath.  Patient went to [facility] with family 6/30-7/3.  She was tripped on walkway and fall on her knee.  No chest pain or dyspnea while ambulation.  Last night around 10 patient had shortness of breath with chest discomfort.  Also had some rhinorrhea which improved with Zyrtec.  However, she woke up from sleep at 3 AM with severe shortness of breath.  Daughter noted oxygen saturation of 88% which improved on supplemental oxygen (from last admission).  Denies any prior episode of orthopnea or PND.   Called PCP office who directed to come to ER.  Patient reports intermittent palpitation lasting for up to an hour.  Associated with shortness of breath.  Intermittent lower extremity edema.  Eats high salt diet. No syncope, dizziness or melena. Social alcohol use.   In ER patient noted hypertensive at 176/90.   Minimally elevated LFTs, BNP 984.  Elevated CRP 2.9.  D-dimer 0.78.  Afebrile.  Leukocytosis at 13.3.  Lactic acid 8.9.  Chest x-ray revealed cardiomegaly with vascular congestion.  She was given IV Lasix 20 mg with improved breathing.  Echocardiogram done this admission, pending reading.  The patient denies prior cardiac history or any evaluation.  Brother had MI at age 75.  Mother died at age 61, thought to be cardiac.  Heart Pathway Score:     Past Medical History:  Diagnosis Date  . Breast cancer, left breast ([place]) ~ 2002   "had radiation"  . Glaucoma   . History of gout   . Hypertension   . Migraine    "used to get them; nothing recently" (05/22/2018)  . Retinal hemorrhage   . Stroke [facility]) 2001   daughter denies residual on 05/22/2018    Past Surgical History:  Procedure Laterality Date  . APPENDECTOMY    . AXILLARY LYMPH NODE DISSECTION Left ~ 2002  . BREAST BIOPSY Left ~ 2002  . EYE SURGERY Left 05/2018   "laser OR for bleed"  . TUBAL LIGATION       Inpatient Medications: Scheduled Meds: . amLODipine  10 mg Oral Daily  . apixaban  5 mg Oral BID  . furosemide  20 mg Intravenous BID  . guaiFENesin  600 mg Oral BID  . hydrALAZINE  50 mg Oral Q8H  . nebivolol  10 mg Oral Daily  . pravastatin  20 mg Oral q1800  . sodium chloride flush  3 mL Intravenous Q12H   Continuous Infusions: . sodium chloride  PRN Meds: sodium chloride, acetaminophen, ondansetron (ZOFRAN) IV, sodium chloride flush  Allergies:    Allergies  Allergen Reactions  . Sulfa Antibiotics Other (See Comments)    Unknown reaction to pt    Social History:   Social History   Socioeconomic History  . Marital status: Widowed    Spouse name: Not on file  . Number of children: Not on file  . Years of education: Not on file  . Highest education level: Not on file  Occupational History  . Not on file  Social Needs  . Financial resource strain: Not on file  . Food insecurity    Worry: Not on  file    Inability: Not on file  . Transportation needs    Medical: Not on file    Non-medical: Not on file  Tobacco Use  . Smoking status: Former Smoker    Packs/day: 1.00    Years: 20.00    Pack years: 20.00    Types: Cigarettes  . Smokeless tobacco: Never Used  . Tobacco comment: 05/22/2018 "quit smoking in the 1990s"  Substance and Sexual Activity  . Alcohol use: Yes    Alcohol/week: 6.0 standard drinks    Types: 6 Shots of liquor per week    Comment: 05/22/2018 "straight vodka"  . Drug use: Never  . Sexual activity: Not on file  Lifestyle  . Physical activity    Days per week: Not on file    Minutes per session: Not on file  . Stress: Not on file  Relationships  . Social Herbalist on phone: Not on file    Gets together: Not on file    Attends religious service: Not on file    Active member of club or organization: Not on file    Attends meetings of clubs or organizations: Not on file    Relationship status: Not on file  . Intimate partner violence    Fear of current or ex partner: Not on file    Emotionally abused: Not on file    Physically abused: Not on file    Forced sexual activity: Not on file  Other Topics Concern  . Not on file  Social History Narrative  . Not on file    Family History:   Family History  Problem Relation Age of Onset  . ALS Sister     ROS:  Please see the history of present illness.  All other ROS reviewed and negative.     Physical Exam/Data:   Vitals:   11/12/18 1000 11/12/18 1030 11/12/18 1115 11/12/18 1200  BP:  (!) 169/64 (!) 164/65 (!) 160/83  Pulse:   64   Resp:  16 (!) 23 14  Temp:      TempSrc:      SpO2:   98%   Weight: 65 kg     Height: 5\' 7"  (1.702 m)      No intake or output data in the 24 hours ending 11/12/18 1239 Last 3 Weights 11/12/2018 06/14/2018 06/13/2018  Weight (lbs) 143 lb 4.8 oz 150 lb 5.7 oz 153 lb 14.1 oz  Weight (kg) 65 kg 68.2 kg 69.8 kg     Body mass index is 22.44 kg/m.  General: Thin  frail elderly female in no acute distress HEENT: normal Lymph: no adenopathy Neck: no JVD Endocrine:  No thryomegaly Vascular: No carotid bruits; FA pulses 2+ bilaterally without bruits  Cardiac:  normal S1, S2; regular, no murmur Lungs: Faint bibasilar Rales  Abd: soft, nontender, no hepatomegaly  Ext: Trace edema Musculoskeletal:  No deformities, BUE and BLE strength normal and equal Skin: warm and dry  Neuro:  CNs 2-12 intact, no focal abnormalities noted Psych:  Normal affect   EKG:  The EKG was personally reviewed and demonstrates:  sinus or ectopic atrial rhythm, PAC, heart rate 69 bpm, early repolarization  Telemetry:  Telemetry was personally reviewed and demonstrates: sinus rhythm / ectopic rhythm at ventricular rate of 60s  Relevant CV Studies  Echo 05/24/2018 Study Conclusions  - Left ventricle: The cavity size was normal. Wall thickness was   increased in a pattern of mild LVH. Systolic function was normal.   The estimated ejection fraction was in the range of 55% to 60%.   Wall motion was normal; there were no regional wall motion   abnormalities. Doppler parameters are consistent with abnormal   left ventricular relaxation (grade 1 diastolic dysfunction).   Doppler parameters are consistent with high ventricular filling   pressure. - Mitral valve: Calcified annulus. - Left atrium: The atrium was mildly dilated.  Impressions:  - Normal LV systolic function; mild diastolic dysfunction; mild   LVH; mild LAE.  Laboratory Data: Chemistry Recent Labs  Lab 11/12/18 0547  NA 141  K 4.6  CL 111  CO2 18*  GLUCOSE 131*  BUN 36*  CREATININE 1.66*  CALCIUM 9.5  GFRNONAA 27*  GFRAA 32*  ANIONGAP 12    Recent Labs  Lab 11/12/18 0547  PROT 7.3  ALBUMIN 3.9  AST 64*  ALT 66*  ALKPHOS 97  BILITOT 1.1   Hematology Recent Labs  Lab 11/12/18 0547  WBC 13.3*  RBC 3.55*  HGB 10.7*  HCT 33.5*  MCV 94.4  MCH 30.1  MCHC 31.9  RDW 14.3  PLT 137*    BNP Recent Labs  Lab 11/12/18 0548  BNP 984.4*    DDimer  Recent Labs  Lab 11/12/18 0547  DDIMER 0.78*   Radiology/Studies:  Dg Chest Portable 1 View  Result Date: 11/12/2018 CLINICAL DATA:  Shortness of breath. EXAM: PORTABLE CHEST 1 VIEW COMPARISON:  06/12/2018.  06/10/2018.  CT 06/05/2018. FINDINGS: Cardiomegaly with pulmonary venous congestion. Bibasilar infiltrates/edema has improved from prior exam. Residual basilar interstitial prominence noted. Mild biapical pleural thickening consistent scarring again noted. No pleural effusion or pneumothorax. IMPRESSION: 1.  Cardiomegaly with pulmonary venous congestion. 2. Bibasilar infiltrates/edema is improved from prior exam. Residual basilar interstitial prominence noted. Electronically Signed   By: Marcello Moores  Register   On: 11/12/2018 05:55    Assessment and Plan:   1.  Presumed acute diastolic dysfunction in setting of hypertensive urgency  -Grade 1 diastolic dysfunction by echocardiogram January 2020.  Lasix previously which was discontinued approximately 8 weeks ago due to hypotension. -Now presenting with acute hypoxic respiratory failure. BNP 984.  Chest x-ray with vascular congestion.  Exam consistent with volume overload. breathing Improved after IV Lasix 20 mg x 1.  Continue IV diuresis.  Strict I&O's and daily weight. -Advised reduce salt intake.  2.  Hypertensive urgency -Patient with history of labile hypertension.  Reports compliance with medication. - SBP remained elevated above 300P -Continue bystolic, amlodipine and hydralazine.  3. Hx of DVT  - Reports compliance with Eliquis  4. CKD stage III - Follow renal function with diuresis - Seems baseline Scr of 1.3-1.6  5. Arrhthymias  - EKG and telemetry does shows P wave frequently. No atrial fibrillation. She is already anticoagulated with Eliquis for DVT.    Will  follow.   For questions or updates, please contact [place] Please consult [email or web address] for  contact info under     Jarrett Soho, PA  11/12/2018 12:39 PM   Personally seen and examined. Agree with above.  85 year old with acute diastolic heart failure, hypertension, chronic kidney disease.  Telemetry and EKG personally reviewed and shows sinus rhythm with occasional PACs.  No atrial fibrillation.  Lab work also reviewed, agree with above.  Baseline creatinine from 1.3-1.6.  Physical exam-alert and oriented, in bed, elderly, mild crackles at lung bases, regular rate and rhythm with occasional ectopy, mild lower extremity edema.  Assessment and plan:  Acute diastolic heart failure Essential hypertension Prior DVT on Eliquis Chronic kidney disease stage III  -Agree with current use of IV Lasix.  Goal out between 1 to 2 L daily.  Watch creatinine closely.  Watch potassium as well, try to maintain greater than 4. - BNP slightly elevated. -Continue with Eliquis for left leg DVT. - After she is fully diuresed, we may need to adjust her antihypertensive regimen to include Lasix perhaps.  I am aware that her Lasix was stopped because of hypotension in the past.  We will follow along.  Candee Furbish, MD

## 2018-11-12 NOTE — ED Notes (Signed)
Report given to Bing Ree, RN on 3E

## 2018-11-12 NOTE — Progress Notes (Signed)
Spoke with pt's daughter Claiborne Billings over the phone, regarding pt's plan of care.  Daughter stated that pt's some times gets restless at night and said if she is needed to stay with pt she can.  Said she is going to call later at night to check on pt.

## 2018-11-13 DIAGNOSIS — R0902 Hypoxemia: Secondary | ICD-10-CM | POA: Diagnosis not present

## 2018-11-13 DIAGNOSIS — Z87891 Personal history of nicotine dependence: Secondary | ICD-10-CM | POA: Diagnosis not present

## 2018-11-13 DIAGNOSIS — I130 Hypertensive heart and chronic kidney disease with heart failure and stage 1 through stage 4 chronic kidney disease, or unspecified chronic kidney disease: Secondary | ICD-10-CM | POA: Diagnosis present

## 2018-11-13 DIAGNOSIS — F05 Delirium due to known physiological condition: Secondary | ICD-10-CM | POA: Diagnosis present

## 2018-11-13 DIAGNOSIS — I160 Hypertensive urgency: Secondary | ICD-10-CM | POA: Diagnosis present

## 2018-11-13 DIAGNOSIS — H409 Unspecified glaucoma: Secondary | ICD-10-CM | POA: Diagnosis present

## 2018-11-13 DIAGNOSIS — D649 Anemia, unspecified: Secondary | ICD-10-CM | POA: Diagnosis present

## 2018-11-13 DIAGNOSIS — Z8673 Personal history of transient ischemic attack (TIA), and cerebral infarction without residual deficits: Secondary | ICD-10-CM | POA: Diagnosis not present

## 2018-11-13 DIAGNOSIS — J9601 Acute respiratory failure with hypoxia: Secondary | ICD-10-CM | POA: Diagnosis present

## 2018-11-13 DIAGNOSIS — E785 Hyperlipidemia, unspecified: Secondary | ICD-10-CM | POA: Diagnosis present

## 2018-11-13 DIAGNOSIS — R0602 Shortness of breath: Secondary | ICD-10-CM | POA: Diagnosis present

## 2018-11-13 DIAGNOSIS — F0150 Vascular dementia without behavioral disturbance: Secondary | ICD-10-CM | POA: Diagnosis not present

## 2018-11-13 DIAGNOSIS — Z853 Personal history of malignant neoplasm of breast: Secondary | ICD-10-CM | POA: Diagnosis not present

## 2018-11-13 DIAGNOSIS — N179 Acute kidney failure, unspecified: Secondary | ICD-10-CM | POA: Diagnosis not present

## 2018-11-13 DIAGNOSIS — Z7901 Long term (current) use of anticoagulants: Secondary | ICD-10-CM | POA: Diagnosis not present

## 2018-11-13 DIAGNOSIS — F0390 Unspecified dementia without behavioral disturbance: Secondary | ICD-10-CM | POA: Diagnosis present

## 2018-11-13 DIAGNOSIS — Z923 Personal history of irradiation: Secondary | ICD-10-CM | POA: Diagnosis not present

## 2018-11-13 DIAGNOSIS — Z79899 Other long term (current) drug therapy: Secondary | ICD-10-CM | POA: Diagnosis not present

## 2018-11-13 DIAGNOSIS — N183 Chronic kidney disease, stage 3 (moderate): Secondary | ICD-10-CM | POA: Diagnosis not present

## 2018-11-13 DIAGNOSIS — Z8249 Family history of ischemic heart disease and other diseases of the circulatory system: Secondary | ICD-10-CM | POA: Diagnosis not present

## 2018-11-13 DIAGNOSIS — I5033 Acute on chronic diastolic (congestive) heart failure: Secondary | ICD-10-CM | POA: Diagnosis not present

## 2018-11-13 DIAGNOSIS — I82532 Chronic embolism and thrombosis of left popliteal vein: Secondary | ICD-10-CM | POA: Diagnosis not present

## 2018-11-13 DIAGNOSIS — Z9981 Dependence on supplemental oxygen: Secondary | ICD-10-CM | POA: Diagnosis not present

## 2018-11-13 DIAGNOSIS — I491 Atrial premature depolarization: Secondary | ICD-10-CM | POA: Diagnosis present

## 2018-11-13 DIAGNOSIS — F1010 Alcohol abuse, uncomplicated: Secondary | ICD-10-CM | POA: Diagnosis present

## 2018-11-13 DIAGNOSIS — D696 Thrombocytopenia, unspecified: Secondary | ICD-10-CM | POA: Diagnosis not present

## 2018-11-13 DIAGNOSIS — I10 Essential (primary) hypertension: Secondary | ICD-10-CM | POA: Diagnosis not present

## 2018-11-13 DIAGNOSIS — Z20828 Contact with and (suspected) exposure to other viral communicable diseases: Secondary | ICD-10-CM | POA: Diagnosis present

## 2018-11-13 LAB — CBC WITH DIFFERENTIAL/PLATELET
Abs Immature Granulocytes: 0.06 10*3/uL (ref 0.00–0.07)
Basophils Absolute: 0.1 10*3/uL (ref 0.0–0.1)
Basophils Relative: 0 %
Eosinophils Absolute: 0 10*3/uL (ref 0.0–0.5)
Eosinophils Relative: 0 %
HCT: 31.9 % — ABNORMAL LOW (ref 36.0–46.0)
Hemoglobin: 10.6 g/dL — ABNORMAL LOW (ref 12.0–15.0)
Immature Granulocytes: 1 %
Lymphocytes Relative: 7 %
Lymphs Abs: 0.9 10*3/uL (ref 0.7–4.0)
MCH: 30.5 pg (ref 26.0–34.0)
MCHC: 33.2 g/dL (ref 30.0–36.0)
MCV: 91.7 fL (ref 80.0–100.0)
Monocytes Absolute: 1.2 10*3/uL — ABNORMAL HIGH (ref 0.1–1.0)
Monocytes Relative: 9 %
Neutro Abs: 11 10*3/uL — ABNORMAL HIGH (ref 1.7–7.7)
Neutrophils Relative %: 83 %
Platelets: 136 10*3/uL — ABNORMAL LOW (ref 150–400)
RBC: 3.48 MIL/uL — ABNORMAL LOW (ref 3.87–5.11)
RDW: 14.2 % (ref 11.5–15.5)
WBC: 13.2 10*3/uL — ABNORMAL HIGH (ref 4.0–10.5)
nRBC: 0 % (ref 0.0–0.2)

## 2018-11-13 LAB — BASIC METABOLIC PANEL
Anion gap: 13 (ref 5–15)
BUN: 38 mg/dL — ABNORMAL HIGH (ref 8–23)
CO2: 22 mmol/L (ref 22–32)
Calcium: 9.6 mg/dL (ref 8.9–10.3)
Chloride: 107 mmol/L (ref 98–111)
Creatinine, Ser: 1.53 mg/dL — ABNORMAL HIGH (ref 0.44–1.00)
GFR calc Af Amer: 35 mL/min — ABNORMAL LOW (ref >60)
GFR calc non Af Amer: 30 mL/min — ABNORMAL LOW (ref >60)
Glucose, Bld: 119 mg/dL — ABNORMAL HIGH (ref 70–99)
Potassium: 3.9 mmol/L (ref 3.5–5.1)
Sodium: 142 mmol/L (ref 135–145)

## 2018-11-13 MED ORDER — LORAZEPAM 2 MG/ML IJ SOLN: 1.0000 mg | Freq: Four times a day (QID) | INTRAMUSCULAR | Status: DC | PRN

## 2018-11-13 MED ORDER — ADULT MULTIVITAMIN W/MINERALS CH
1.0000 | ORAL_TABLET | Freq: Every day | ORAL | Status: DC
  Administered 2018-11-13 – 2018-11-16 (×4): 1 via ORAL
  Filled 2018-11-13 (×4): qty 1

## 2018-11-13 MED ORDER — ENSURE ENLIVE PO LIQD
237.0000 mL | Freq: Two times a day (BID) | ORAL | Status: DC
  Administered 2018-11-14 – 2018-11-16 (×5): 237 mL via ORAL

## 2018-11-13 MED ORDER — FOLIC ACID 1 MG PO TABS
1.0000 mg | ORAL_TABLET | Freq: Every day | ORAL | Status: DC
  Administered 2018-11-13 – 2018-11-15 (×3): 1 mg via ORAL
  Filled 2018-11-13 (×3): qty 1

## 2018-11-13 MED ORDER — LORAZEPAM 1 MG PO TABS: 1.0000 mg | ORAL_TABLET | Freq: Four times a day (QID) | ORAL | Status: DC | PRN

## 2018-11-13 MED ORDER — VITAMIN B-1 100 MG PO TABS
100.0000 mg | ORAL_TABLET | Freq: Every day | ORAL | Status: DC
  Administered 2018-11-13 – 2018-11-14 (×2): 100 mg via ORAL
  Filled 2018-11-13 (×3): qty 1

## 2018-11-13 MED ORDER — THIAMINE HCL 100 MG/ML IJ SOLN
100.0000 mg | Freq: Every day | INTRAMUSCULAR | Status: DC
  Administered 2018-11-15: 100 mg via INTRAVENOUS
  Filled 2018-11-13: qty 2

## 2018-11-13 MED ORDER — APIXABAN 2.5 MG PO TABS
2.5000 mg | ORAL_TABLET | Freq: Two times a day (BID) | ORAL | Status: DC
  Administered 2018-11-13 – 2018-11-16 (×6): 2.5 mg via ORAL
  Filled 2018-11-13 (×6): qty 1

## 2018-11-13 NOTE — Progress Notes (Signed)
Patient refusing to leave continuous pulse ox on.  [facility] notified.

## 2018-11-13 NOTE — Evaluation (Signed)
Physical Therapy Evaluation Patient Details Name: Rhonda Sharp MRN: [id number] DOB: 1929/01/20 Today's Date: 11/13/2018   History of Present Illness  85 yo admitted with SOB with presumed acute diastolic dysfunction in setting of hypertensive urgency . PMhx: HTN, CVa, CKD, glaucoma, gout, breast CA, ?dementia  Clinical Impression  Pt with eyes closed on arrival but able to open eyes and respond to all commands and questions. Pt with slow processing and unaware of situation, town, place, day. Pt with decreased strength, gait, transfers, mobility and function who will benefit from acute therapy to maximize mobility, gait and independence to decrease burden of care. Pt incontinent of urine on arrival and unaware with max assist for pericare and linen change. Pt also with O2 off on arrival and unable to state if she wears O2 at home with sats 85% on RA with return to 3L and 92%. Hr 70.      Follow Up Recommendations Home health PT;Supervision/Assistance - 24 hour    Equipment Recommendations  Rolling walker with 5" wheels    Recommendations for Other Services       Precautions / Restrictions Precautions Precautions: Fall      Mobility  Bed Mobility Overal bed mobility: Needs Assistance Bed Mobility: Supine to Sit     Supine to sit: Min assist     General bed mobility comments: cues for sequence with assist to elevate trunk with hand held assist and increased time  Transfers Overall transfer level: Needs assistance   Transfers: Sit to/from Stand Sit to Stand: Min guard         General transfer comment: cues for hand placement with guarding to rise and safety  Ambulation/Gait Ambulation/Gait assistance: Min guard Gait Distance (Feet): 30 Feet Assistive device: Rolling walker (2 wheeled) Gait Pattern/deviations: Step-through pattern;Decreased stride length;Trunk flexed   Gait velocity interpretation: 1.31 - 2.62 ft/sec, indicative of limited community  ambulator General Gait Details: cues for posture and proximity to RW, pt declined further gait due to fatigue  Stairs            Wheelchair Mobility    Modified Rankin (Stroke Patients Only)       Balance Overall balance assessment: Needs assistance Sitting-balance support: No upper extremity supported Sitting balance-Leahy Scale: Fair     Standing balance support: Bilateral upper extremity supported Standing balance-Leahy Scale: Poor                               Pertinent Vitals/Pain Pain Assessment: No/denies pain    Home Living Family/patient expects to be discharged to:: Private residence Living Arrangements: Children;Other relatives;Spouse/significant other Available Help at Discharge: Family;Available 24 hours/day Type of Home: House Home Access: Stairs to enter   [employer] of Steps: 3 Home Layout: One level Home Equipment: Cane - single point Additional Comments: pt reports she lives with spouse, daughter and son-in-law    Prior Function Level of Independence: Independent with assistive device(s)         Comments: pt reports she uses a cane and is independent with ADLs, doesn't drive     Hand Dominance        Extremity/Trunk Assessment   Upper Extremity Assessment Upper Extremity Assessment: Generalized weakness    Lower Extremity Assessment Lower Extremity Assessment: Generalized weakness    Cervical / Trunk Assessment Cervical / Trunk Assessment: Kyphotic  Communication   Communication: No difficulties  Cognition Arousal/Alertness: Lethargic Behavior During Therapy: Flat affect Overall Cognitive  Status: Impaired/Different from baseline Area of Impairment: Orientation;Attention;Memory;Safety/judgement;Problem solving                 Orientation Level: Disoriented to;Time;Place;Situation Current Attention Level: Sustained Memory: Decreased short-term memory   Safety/Judgement: Decreased awareness of  deficits   Problem Solving: Slow processing;Decreased initiation;Requires verbal cues General Comments: pt able to answer all questions with tendency for closing eyes at rest with cueing to attend to task      General Comments      Exercises     Assessment/Plan    PT Assessment Patient needs continued PT services  PT Problem List Decreased strength;Decreased mobility;Decreased safety awareness;Decreased coordination;Decreased activity tolerance;Decreased cognition;Decreased balance;Decreased knowledge of use of DME       PT Treatment Interventions DME instruction;Therapeutic activities;Cognitive remediation;Gait training;Therapeutic exercise;Patient/family education;Functional mobility training;Stair training    PT Goals (Current goals can be found in the Care Plan section)  Acute Rehab PT Goals Patient Stated Goal: return home with family PT Goal Formulation: With patient Time For Goal Achievement: 11/27/18 Potential to Achieve Goals: Fair    Frequency Min 3X/week   Barriers to discharge        Co-evaluation               AM-PAC PT "6 Clicks" Mobility  Outcome Measure Help needed turning from your back to your side while in a flat bed without using bedrails?: A Little Help needed moving from lying on your back to sitting on the side of a flat bed without using bedrails?: A Little Help needed moving to and from a bed to a chair (including a wheelchair)?: A Little Help needed standing up from a chair using your arms (e.g., wheelchair or bedside chair)?: A Little Help needed to walk in hospital room?: A Little Help needed climbing 3-5 steps with a railing? : A Lot 6 Click Score: 17    End of Session Equipment Utilized During Treatment: Gait belt;Oxygen Activity Tolerance: Patient tolerated treatment well Patient left: in chair;with call bell/phone within reach;with chair alarm set;with nursing/sitter in room Nurse Communication: Mobility status PT Visit  Diagnosis: Other abnormalities of gait and mobility (R26.89);Muscle weakness (generalized) (M62.81);Unsteadiness on feet (R26.81)    Time: 6384-6659 PT Time Calculation (min) (ACUTE ONLY): 21 min   Charges:   PT Evaluation $PT Eval Moderate Complexity: 1 Mod          [place], PT Acute Rehabilitation Services Pager: [phone] Office: [place] 11/13/2018, 1:05 PM

## 2018-11-13 NOTE — Progress Notes (Signed)
Progress Note    Rhonda Sharp  AOZ:[id number] DOB: 1928/05/14  DOA: 11/12/2018 PCP: Haywood Pao, MD    Brief Narrative:     Medical records reviewed and are as summarized below:  Rhonda Sharp is an 85 y.o. female with medical history significant of hypertension, CVA, HLD, chronic kidney disease stage III, diastolic dysfunction, DVT on Eliquis, mild dementia, and breast cancer s/p radiation; who presents with complaints of shortness of breath since yesterday.  She reported feeling short of breath while moving around yesterday evening.    Assessment/Plan:   Active Problems:   Essential hypertension   Thrombocytopenia (HCC)   Acute respiratory failure with hypoxia (HCC)   Chronic deep vein thrombosis (DVT) of popliteal vein of left lower extremity (HCC)   Leukocytosis   Acute exacerbation of CHF (congestive heart failure) (HCC)   CKD (chronic kidney disease), stage III (HCC)   Dementia (HCC)  Acute respiratory failure with hypoxia secondary to diastolic congestive heart failure exacerbation: Patient presents with shortness of breath and a dry cough.  At home O2 saturations as low as 88% improved with nasal cannula oxygen.  Chest x-ray showing cardiomegaly with vascular congestion with BNP 984.4 which is similar to previous hospitalization in January. Echocardiogram in January revealed EF of 55 to 60% with grade 1 diastolic dysfunction. - Heart failure orders set  initiated  - Continuous pulse oximetry with nasal cannula oxygen as needed to keep O2 saturations >92% - Strict I&Os and daily weights - Elevate lower extremities - Lasix IV - cardiology consult appreciated -2.1L   Leukocytosis: Acute.  WBC elevated at 13.3  -etiology not clear  Essential hypertension:  -Continue amlodipine, hydralazine, Bystolic as tolerated  Normocytic normochromic anemia: Chronic.  Hemoglobin 10.7 on admission which appears near patient's baseline.  History of DVT on  chronic anticoagulation: Noted to a left popliteal DVT during last admission in January of this year.  Patient has been continued on Eliquis. -Continue Eliquis  Chronic kidney disease stage III/IV: Patient presents with creatinine 1.66 with BUN 36 which appears slightly increased from baseline. -daily bmp  Hyperlipidemia -Continue pravastatin  History of CVA: Patient noted to have tiny acute ischemic infarcts of the left temporal cortex on MRI in January of this year.  No residual deficits noted. -Continue Eliquis and statin  Question of dementia: Patient with history of suspect dementia.  Daughter reports patient being altered during last admissions after prolonged stay.  Question dementia with sundowning versus delirium versus alcohol withdrawal. -Continue to monitor  Elevated liver enzymes: AST and ALT mildly elevated at 64 and 66 respectively.  Patient recently reported drinking over the weekend, but no longer drinks on a daily basis.  Other causes could include passive congestion related with CHF. -trend  Reported alcohol abuse -CIWA  Thrombocytopenia: Chronic.  Platelet count 137 on admission.  Suspect previous history of alcohol abuse. -Continue to monitor   Family Communication/Anticipated D/C date and plan/Code Status   DVT prophylaxis: eliquis Code Status: Full Code.  Family Communication: LM for daughter Disposition Plan: PT eval pending   Medical Consultants:    cards  Subjective:   confused  Objective:    Vitals:   11/13/18 0135 11/13/18 0338 11/13/18 0750 11/13/18 1202  BP: (!) 170/70 (!) 164/69 (!) 176/70 (!) 150/88  Pulse: 70 70  65  Resp:  20 20 16   Temp:  98.1 F (36.7 C) 99.7 F (37.6 C) 97.7 F (36.5 C)  TempSrc:  Oral Oral Oral  SpO2:  97% 95% 97%  Weight:  67.6 kg    Height:        Intake/Output Summary (Last 24 hours) at 11/13/2018 1253 Last data filed at 11/13/2018 1042 Gross per 24 hour  Intake 480 ml  Output 2100 ml  Net  -1620 ml   Filed Weights   11/12/18 1000 11/12/18 1553 11/13/18 0338  Weight: 65 kg 64.1 kg 67.6 kg    Exam: Appears confused and sleepy rrr Diminished but no wheezing No LE edema   Data Reviewed:   I have personally reviewed following labs and imaging studies:  Labs: Labs show the following:   Basic Metabolic Panel: Recent Labs  Lab 11/12/18 0547 11/13/18 0449  NA 141 142  K 4.6 3.9  CL 111 107  CO2 18* 22  GLUCOSE 131* 119*  BUN 36* 38*  CREATININE 1.66* 1.53*  CALCIUM 9.5 9.6   GFR Estimated Creatinine Clearance: 25.2 mL/min (A) (by C-G formula based on SCr of 1.53 mg/dL (H)). Liver Function Tests: Recent Labs  Lab 11/12/18 0547  AST 64*  ALT 66*  ALKPHOS 97  BILITOT 1.1  PROT 7.3  ALBUMIN 3.9   No results for input(s): LIPASE, AMYLASE in the last 168 hours. No results for input(s): AMMONIA in the last 168 hours. Coagulation profile No results for input(s): INR, PROTIME in the last 168 hours.  CBC: Recent Labs  Lab 11/12/18 0547 11/13/18 0449  WBC 13.3* 13.2*  NEUTROABS 10.8* 11.0*  HGB 10.7* 10.6*  HCT 33.5* 31.9*  MCV 94.4 91.7  PLT 137* 136*   Cardiac Enzymes: No results for input(s): CKTOTAL, CKMB, CKMBINDEX, TROPONINI in the last 168 hours. BNP (last 3 results) No results for input(s): PROBNP in the last 8760 hours. CBG: Recent Labs  Lab 11/12/18 1626  GLUCAP 88   D-Dimer: Recent Labs    11/12/18 0547  DDIMER 0.78*   Hgb A1c: No results for input(s): HGBA1C in the last 72 hours. Lipid Profile: Recent Labs    11/12/18 0547  TRIG 74   Thyroid function studies: No results for input(s): TSH, T4TOTAL, T3FREE, THYROIDAB in the last 72 hours.  Invalid input(s): FREET3 Anemia work up: Recent Labs    11/12/18 0547  FERRITIN 96   Sepsis Labs: Recent Labs  Lab 11/12/18 0547 11/13/18 0449  PROCALCITON <0.10  --   WBC 13.3* 13.2*  LATICACIDVEN 0.9  --     Microbiology Recent Results (from the past 240 hour(s))   Blood Culture (routine x 2)     Status: None (Preliminary result)   Collection Time: 11/12/18  5:35 AM   Specimen: BLOOD  Result Value Ref Range Status   Specimen Description BLOOD RIGHT HAND  Final   Special Requests   Final    BOTTLES DRAWN AEROBIC AND ANAEROBIC Blood Culture adequate volume   Culture   Final    NO GROWTH 1 DAY Performed at [facility], [address] [place], [place] [address]    Report Status PENDING  Incomplete  Blood Culture (routine x 2)     Status: None (Preliminary result)   Collection Time: 11/12/18  5:37 AM   Specimen: BLOOD  Result Value Ref Range Status   Specimen Description BLOOD RIGHT ARM  Final   Special Requests   Final    BOTTLES DRAWN AEROBIC AND ANAEROBIC Blood Culture adequate volume   Culture   Final    NO GROWTH 1 DAY Performed at [facility], [place] [address]  [place], [place] [address]    Report Status PENDING  Incomplete  SARS Coronavirus 2 [facility] order, Performed in [place] hospital lab)     Status: None   Collection Time: 11/12/18  6:05 AM   Specimen: Nasopharyngeal Swab  Result Value Ref Range Status   SARS Coronavirus 2 NEGATIVE NEGATIVE Final    Comment: (NOTE) If result is NEGATIVE SARS-CoV-2 target nucleic acids are NOT DETECTED. The SARS-CoV-2 RNA is generally detectable in upper and lower  respiratory specimens during the acute phase of infection. The lowest  concentration of SARS-CoV-2 viral copies this assay can detect is 250  copies / mL. A negative result does not preclude SARS-CoV-2 infection  and should not be used as the sole basis for treatment or other  patient management decisions.  A negative result may occur with  improper specimen collection / handling, submission of specimen other  than nasopharyngeal swab, presence of viral mutation(s) within the  areas targeted by this assay, and inadequate number of viral copies  (<250 copies / mL). A negative result must be combined with clinical   observations, patient history, and epidemiological information. If result is POSITIVE SARS-CoV-2 target nucleic acids are DETECTED. The SARS-CoV-2 RNA is generally detectable in upper and lower  respiratory specimens dur ing the acute phase of infection.  Positive  results are indicative of active infection with SARS-CoV-2.  Clinical  correlation with patient history and other diagnostic information is  necessary to determine patient infection status.  Positive results do  not rule out bacterial infection or co-infection with other viruses. If result is PRESUMPTIVE POSTIVE SARS-CoV-2 nucleic acids MAY BE PRESENT.   A presumptive positive result was obtained on the submitted specimen  and confirmed on repeat testing.  While 2019 novel coronavirus  (SARS-CoV-2) nucleic acids may be present in the submitted sample  additional confirmatory testing may be necessary for epidemiological  and / or clinical management purposes  to differentiate between  SARS-CoV-2 and other Sarbecovirus currently known to infect humans.  If clinically indicated additional testing with an alternate test  methodology [phone]) is advised. The SARS-CoV-2 RNA is generally  detectable in upper and lower respiratory sp ecimens during the acute  phase of infection. The expected result is Negative. Fact Sheet for Patients:  StrictlyIdeas.no Fact Sheet for Healthcare Providers: BankingDealers.co.za This test is not yet approved or cleared by the [place] FDA and has been authorized for detection and/or diagnosis of SARS-CoV-2 by FDA under an Emergency Use Authorization (EUA).  This EUA will remain in effect (meaning this test can be used) for the duration of the COVID-19 declaration under Section 564(b)(1) of the Act, 21 U.S.C. section 360bbb-3(b)(1), unless the authorization is terminated or revoked sooner. Performed at [facility], [place] [address],  [place], [place] [address]     Procedures and diagnostic studies:  Dg Chest Portable 1 View  Result Date: 11/12/2018 CLINICAL DATA:  Shortness of breath. EXAM: PORTABLE CHEST 1 VIEW COMPARISON:  06/12/2018.  06/10/2018.  CT 06/05/2018. FINDINGS: Cardiomegaly with pulmonary venous congestion. Bibasilar infiltrates/edema has improved from prior exam. Residual basilar interstitial prominence noted. Mild biapical pleural thickening consistent scarring again noted. No pleural effusion or pneumothorax. IMPRESSION: 1.  Cardiomegaly with pulmonary venous congestion. 2. Bibasilar infiltrates/edema is improved from prior exam. Residual basilar interstitial prominence noted. Electronically Signed   By: Marcello Moores  Register   On: 11/12/2018 05:55    Medications:   . amLODipine  10 mg Oral Daily  . apixaban  5 mg Oral BID  . furosemide  20 mg Intravenous BID  . guaiFENesin  600 mg Oral BID  . hydrALAZINE  50 mg Oral Q8H  . nebivolol  10 mg Oral Daily  . pravastatin  20 mg Oral q1800  . sodium chloride flush  3 mL Intravenous Q12H   Continuous Infusions: . sodium chloride       LOS: 0 days   Geradine Girt  [facility]   How to contact the [facility] Attending or Consulting provider [place] or covering provider during after hours [place], for this patient?  1. Check the care team in [facility] and look for a) attending/consulting [facility] provider listed and b) the [facility] team listed 2. Log into [email or web address] and use [place]'s universal password to access. If you do not have the password, please contact the hospital operator. 3. Locate the [facility] provider you are looking for under [facility] and page to a number that you can be directly reached. 4. If you still have difficulty reaching the provider, please page the [facility] (Director on Call) for the Hospitalists listed on amion for assistance.  11/13/2018, 12:53 PM

## 2018-11-13 NOTE — Progress Notes (Signed)
Patient was refusing to take medication.  Patient having extreme paranoia and confusion.  Patient attempted to hold onto medication and then placed them in her mouth and spit them across the bed. Called patient's daughter, [name].  Patient's daughter spoke with patient on the phone and was able to convince the patient to take her medication and that the staff were here to help her.  Patient took medication.

## 2018-11-13 NOTE — Progress Notes (Signed)
Progress Note  Patient Name: Rhonda Sharp Date of Encounter: 11/13/2018  Primary Cardiologist: No primary care provider on file. Cristine Daw  Subjective   Had some confusion overnight.  Otherwise breathing better but not at baseline  Inpatient Medications    Scheduled Meds: . amLODipine  10 mg Oral Daily  . apixaban  5 mg Oral BID  . furosemide  20 mg Intravenous BID  . guaiFENesin  600 mg Oral BID  . hydrALAZINE  50 mg Oral Q8H  . nebivolol  10 mg Oral Daily  . pravastatin  20 mg Oral q1800  . sodium chloride flush  3 mL Intravenous Q12H   Continuous Infusions: . sodium chloride     PRN Meds: sodium chloride, acetaminophen, ondansetron (ZOFRAN) IV, sodium chloride flush   Vital Signs    Vitals:   11/13/18 0023 11/13/18 0135 11/13/18 0338 11/13/18 0750  BP: (!) 155/122 (!) 170/70 (!) 164/69 (!) 176/70  Pulse: 73 70 70   Resp: 20  20 20   Temp: 98.7 F (37.1 C)  98.1 F (36.7 C) 99.7 F (37.6 C)  TempSrc: Oral  Oral Oral  SpO2: 96%  97% 95%  Weight:   67.6 kg   Height:        Intake/Output Summary (Last 24 hours) at 11/13/2018 1056 Last data filed at 11/13/2018 1042 Gross per 24 hour  Intake 480 ml  Output 2100 ml  Net -1620 ml   Last 3 Weights 11/13/2018 11/12/2018 11/12/2018  Weight (lbs) 149 lb 141 lb 4.8 oz 143 lb 4.8 oz  Weight (kg) 67.586 kg 64.093 kg 65 kg      Telemetry    Occasional PACs-sinus rhythm personally Reviewed  ECG    Sinus rhythm 69 early repolarization- Personally Reviewed  Physical Exam  Elderly GEN: No acute distress.   Neck: No JVD Cardiac: RRR, no murmurs, rubs, or gallops.  Respiratory: Clear to auscultation bilaterally. GI: Soft, nontender, non-distended  MS: No edema; No deformity. Neuro:  Nonfocal  Psych: Normal affect, a little confused  Labs    High Sensitivity Troponin:   Recent Labs  Lab 11/12/18 1602  TROPONINIHS 12      Cardiac EnzymesNo results for input(s): TROPONINI in the last 168 hours. No results for  input(s): TROPIPOC in the last 168 hours.   Chemistry Recent Labs  Lab 11/12/18 0547 11/13/18 0449  NA 141 142  K 4.6 3.9  CL 111 107  CO2 18* 22  GLUCOSE 131* 119*  BUN 36* 38*  CREATININE 1.66* 1.53*  CALCIUM 9.5 9.6  PROT 7.3  --   ALBUMIN 3.9  --   AST 64*  --   ALT 66*  --   ALKPHOS 97  --   BILITOT 1.1  --   GFRNONAA 27* 30*  GFRAA 32* 35*  ANIONGAP 12 13     Hematology Recent Labs  Lab 11/12/18 0547 11/13/18 0449  WBC 13.3* 13.2*  RBC 3.55* 3.48*  HGB 10.7* 10.6*  HCT 33.5* 31.9*  MCV 94.4 91.7  MCH 30.1 30.5  MCHC 31.9 33.2  RDW 14.3 14.2  PLT 137* 136*    BNP Recent Labs  Lab 11/12/18 0548  BNP 984.4*     DDimer  Recent Labs  Lab 11/12/18 0547  DDIMER 0.78*     Radiology    Dg Chest Portable 1 View  Result Date: 11/12/2018 CLINICAL DATA:  Shortness of breath. EXAM: PORTABLE CHEST 1 VIEW COMPARISON:  06/12/2018.  06/10/2018.  CT 06/05/2018. FINDINGS:  Cardiomegaly with pulmonary venous congestion. Bibasilar infiltrates/edema has improved from prior exam. Residual basilar interstitial prominence noted. Mild biapical pleural thickening consistent scarring again noted. No pleural effusion or pneumothorax. IMPRESSION: 1.  Cardiomegaly with pulmonary venous congestion. 2. Bibasilar infiltrates/edema is improved from prior exam. Residual basilar interstitial prominence noted. Electronically Signed   By: Marcello Moores  Register   On: 11/12/2018 05:55    Cardiac Studies   EF 60%  Patient Profile     85 y.o. female with labile hypertension and prior stroke DVT on Eliquis diastolic dysfunction hyperlipidemia mild dementia with acute diastolic heart failure.  Assessment & Plan    Acute diastolic heart failure Essential hypertension labile Prior DVT on Eliquis Chronic kidney disease stage III  -Continue with IV Lasix.  Creatinine remained stable.  2.1 L urine output yesterday. -Continue with Eliquis for left leg DVT -As diuresis continues,  hypertension should improve. -Sundowning noted from dementia.      For questions or updates, please contact [place] Please consult [email or web address] for contact info under        Signed, Candee Furbish, MD  11/13/2018, 10:56 AM

## 2018-11-13 NOTE — Discharge Instructions (Signed)
Information on my medicine - ELIQUIS (apixaban)  Why was Eliquis prescribed for you? Eliquis was prescribed to treat blood clots that may have been found in the veins of your legs (deep vein thrombosis) or in your lungs (pulmonary embolism) and to reduce the risk of them occurring again.  What do You need to know about Eliquis ? The dose is ONE 2.5 mg tablet taken TWICE daily.  Eliquis may be taken with or without food.   Try to take the dose about the same time in the morning and in the evening. If you have difficulty swallowing the tablet whole please discuss with your pharmacist how to take the medication safely.  Take Eliquis exactly as prescribed and DO NOT stop taking Eliquis without talking to the doctor who prescribed the medication.  Stopping may increase your risk of developing a new blood clot.  Refill your prescription before you run out.  After discharge, you should have regular check-up appointments with your healthcare provider that is prescribing your Eliquis.    What do you do if you miss a dose? If a dose of ELIQUIS is not taken at the scheduled time, take it as soon as possible on the same day and twice-daily administration should be resumed. The dose should not be doubled to make up for a missed dose.  Important Safety Information A possible side effect of Eliquis is bleeding. You should call your healthcare provider right away if you experience any of the following: ? Bleeding from an injury or your nose that does not stop. ? Unusual colored urine (red or dark brown) or unusual colored stools (red or black). ? Unusual bruising for unknown reasons. ? A serious fall or if you hit your head (even if there is no bleeding).  Some medicines may interact with Eliquis and might increase your risk of bleeding or clotting while on Eliquis. To help avoid this, consult your healthcare provider or pharmacist prior to using any new prescription or non-prescription  medications, including herbals, vitamins, non-steroidal anti-inflammatory drugs (NSAIDs) and supplements.  This website has more information on Eliquis (apixaban): http://www.eliquis.com/eliquis/home  

## 2018-11-13 NOTE — Progress Notes (Signed)
Patient compliant with taking medication this morning and does not appear to be having any additional paranoia.

## 2018-11-13 NOTE — Progress Notes (Signed)
Initial Nutrition Assessment  DOCUMENTATION CODES:   Not applicable  INTERVENTION:   -Magic cup TID with meals, each supplement provides 290 kcal and 9 grams of protein -Ensure Enlive po BID, each supplement provides 350 kcal and 20 grams of protein -MVI with minerals daily  NUTRITION DIAGNOSIS:   Inadequate oral intake related to decreased appetite as evidenced by meal completion < 25%.  GOAL:   Patient will meet greater than or equal to 90% of their needs  MONITOR:   PO intake, Supplement acceptance, Labs, Weight trends, Skin, I & O's  REASON FOR ASSESSMENT:   Malnutrition Screening Tool    ASSESSMENT:   Rhonda Sharp is a 85 y.o. female with medical history significant of hypertension, CVA, HLD, chronic kidney disease stage III, diastolic dysfunction, DVT on Eliquis, mild dementia, and breast cancer s/p radiation; who presents with complaints of shortness of breath since yesterday.  She reported feeling short of breath while moving around yesterday evening.  Associated symptoms of palpitations, rhinorrhea, dry cough, ankle swelling, and generalized malaise.  Given her some Zyrtec with improvement of rhinorrhea. Patient's daughter notes that she has been more tired here lately and it has been harder for her to get up and move around.  This morning around 3 AM patient's O2 saturations reported to be as low as 88% on room air when checked by family.  She was placed on oxygen that she received during her last admission with improvement of O2 saturation greater than 92%.  She had just recently went to the beach last week and had tripped on a walkway with all of their falling onto her right knee.  Denies any fever, loss of consciousness, chest pain, abdominal pain, nausea, vomiting, diarrhea, or dysuria symptoms.  Patient was admitted into the hospital twice in January.  During the first admission patient was noted to be altered with hypertensive urgency and ultimately to have small  acute CVA of the left temporal cortex.  During the secondary acute respiratory failure secondary to community-acquired pneumonia with diastolic heart failure and newly found left leg DVT started on Eliquis.  Patient has been taking all her medications as prescribed including Eliquis.  She does not smoke and no longer drinks alcohol on daily occurrence.  Last noted to drink alcohol during last week at the beach.  Family reports that patient memory is fair and during her last admission she was noted to be altered and possibly having sundowning.  Pt admitted with acute respiratory failure with hypoxia secondary to CHF exacerbation.   Reviewed I/O's: -1.6 L x 24 hours  UOP: 2.1 L x 24 hours  Spoke with pt at bedside, who was pleasant, however, unable to provide much history. Pt endorses poor appetite "for a long time", however, unsure of time frame. She is unable to provide diet history of examples of food commonly eaten, sting "sorry, I just don't know". Noted meal completion poor PO: 0-50% (averaging around 25% of meals). Pt does not recall eating breakfast this AM, but eating lunch at time of visit.   Reviewed wt hx; wt has been stable.   Given decreased oral intake, pt would benefit from addition of nutritional supplements.   Labs reviewed: CBGS: 88.   NUTRITION - FOCUSED PHYSICAL EXAM:    Most Recent Value  Orbital Region  No depletion  Upper Arm Region  Mild depletion  Thoracic and Lumbar Region  No depletion  Buccal Region  Mild depletion  Temple Region  No depletion  Clavicle Bone  Region  No depletion  Clavicle and Acromion Bone Region  No depletion  Scapular Bone Region  No depletion  Dorsal Hand  No depletion  Patellar Region  No depletion  Anterior Thigh Region  No depletion  Posterior Calf Region  No depletion  Edema (RD Assessment)  Mild  Hair  Reviewed  Eyes  Reviewed  Mouth  Reviewed  Skin  Reviewed  Nails  Reviewed       Diet Order:   Diet Order            Diet  Heart Room service appropriate? Yes; Fluid consistency: Thin  Diet effective now              EDUCATION NEEDS:   No education needs have been identified at this time  Skin:  Skin Assessment: Skin Integrity Issues: Skin Integrity Issues:: Other (Comment) Other: MASD abdomen, breast, and buttocks  Last BM:  11/12/18  Height:   Ht Readings from Last 1 Encounters:  11/12/18 5\' 7"  (1.702 m)    Weight:   Wt Readings from Last 1 Encounters:  11/13/18 67.6 kg    Ideal Body Weight:  61.4 kg  BMI:  Body mass index is 23.34 kg/m.  Estimated Nutritional Needs:   Kcal:  1500-1700  Protein:  75-90 grams  Fluid:  > 1.5 L    Hervey Wedig A. Jimmye Norman, RD, LDN, [place] Registered Dietitian II Certified Diabetes Care and Education Specialist Pager: [phone] After hours Pager: [phone]

## 2018-11-14 ENCOUNTER — Encounter (HOSPITAL_COMMUNITY): Payer: Self-pay | Admitting: Nurse Practitioner

## 2018-11-14 DIAGNOSIS — N179 Acute kidney failure, unspecified: Secondary | ICD-10-CM

## 2018-11-14 DIAGNOSIS — R0902 Hypoxemia: Secondary | ICD-10-CM

## 2018-11-14 LAB — BASIC METABOLIC PANEL
Anion gap: 11 (ref 5–15)
BUN: 54 mg/dL — ABNORMAL HIGH (ref 8–23)
CO2: 22 mmol/L (ref 22–32)
Calcium: 8.9 mg/dL (ref 8.9–10.3)
Chloride: 104 mmol/L (ref 98–111)
Creatinine, Ser: 1.95 mg/dL — ABNORMAL HIGH (ref 0.44–1.00)
GFR calc Af Amer: 26 mL/min — ABNORMAL LOW (ref >60)
GFR calc non Af Amer: 23 mL/min — ABNORMAL LOW (ref >60)
Glucose, Bld: 97 mg/dL (ref 70–99)
Potassium: 3.6 mmol/L (ref 3.5–5.1)
Sodium: 137 mmol/L (ref 135–145)

## 2018-11-14 NOTE — Progress Notes (Signed)
PROGRESS NOTE    Rhonda Sharp  YYT:[id number] DOB: 01-Apr-1929 DOA: 11/12/2018 PCP: Haywood Pao, MD    Brief Narrative:  85 year old female who presented with dyspnea.  She does have significant past medical history for hypertension, CVA, dyslipidemia, stage III chronic kidney disease, diastolic heart failure, deep vein thrombosis, breast cancer and mild dementia.  Patient reported dyspnea for about 24 hours, associated with palpitations, dry cough, generalized weakness and ankle swelling.  On the day of admission her oxygenation was 88% on room air.  Apparently she had a recent travel to the beach.  On her initial physical examination blood pressure 160/90, heart rate 66, respiratory 23, temperature 99.3, oxygen saturation 95% on supplemental oxygen.  Moist mucous membranes, her lungs had bibasilar rales, heart S1-S2 present and rhythmic, abdomen soft nontender, positive lower extremity edema.  Chest radiograph with bilateral lower lobe interstitial infiltrates, vascular congestion.  EKG 64 bpm, normal axis, normal intervals, sinus rhythm with multiple PACs, no ST segment or T wave changes.  Patient was admitted to the hospital with the working diagnosis acute on chronic diastolic heart failure decompensation.   Assessment & Plan:   Active Problems:   Essential hypertension   Thrombocytopenia (HCC)   Acute respiratory failure with hypoxia (HCC)   Chronic deep vein thrombosis (DVT) of popliteal vein of left lower extremity (HCC)   Leukocytosis   Acute exacerbation of CHF (congestive heart failure) (HCC)   CKD (chronic kidney disease), stage III ([place])   Dementia ([place])   1. Acute on chronic diastolic heart failure. Patient with improvement in volume status, her urine output over last 24 H is documented 850 ml. Her blood pressure 275 to 170 systolic. Patient received furosemide for diuresis. Continue heart failure management with bystolic.   2. AKI on CKD stage 3. Worsening renal  function with serum cr at 1,95 from 1,53, K at 3,6 and serum bicarbonate at 22. Will continue to follow on renal panel in am, avoid hypotension and nephrotoxic medications. Her base cr is 1,5 to 1,6.  3. Chronic DVT. Continue anticoagulation with apixaban   4. HTN. Blood pressure has been controlled, continue close monitoring. Continue amlodipine and 50 mg hydralazine tid.   5. Dyslipidemia. Continue pravastatin.   DVT prophylaxis: apixaban  Code Status:  full Family Communication:  No family at the bedside  Disposition Plan/ discharge barriers: pending clinical improvement.   Body mass index is 22.08 kg/m. Malnutrition Type:  Nutrition Problem: Inadequate oral intake Etiology: decreased appetite   Malnutrition Characteristics:  Signs/Symptoms: meal completion < 25%   Nutrition Interventions:  Interventions: Ensure Enlive (each supplement provides 350kcal and 20 grams of protein), Magic cup, MVI  RN Pressure Injury Documentation:     Consultants:   Cardiology   Procedures:     Antimicrobials:       Subjective: Patient is feeling better, but not yet back to baseline, no nausea or vomiting, no chest pain. Dyspnea continue to improve.   Objective: Vitals:   11/14/18 0631 11/14/18 0650 11/14/18 0900 11/14/18 1138  BP: (!) 146/71  119/60 (!) 143/62  Pulse: 64  60 64  Resp: 20  16 16   Temp: 99.6 F (37.6 C)   98.3 F (36.8 C)  TempSrc: Oral   Oral  SpO2: (!) 89% 94% 95% 96%  Weight:      Height:        Intake/Output Summary (Last 24 hours) at 11/14/2018 1220 Last data filed at 11/14/2018 0913 Gross per 24 hour  Intake  603 ml  Output 850 ml  Net -247 ml   Filed Weights   11/12/18 1553 11/13/18 0338 11/14/18 0600  Weight: 64.1 kg 67.6 kg 64 kg    Examination:   General: Not in pain or dyspnea, deconditioned  Neurology: Awake and alert, non focal  E ENT: mild pallor, no icterus, oral mucosa moist Cardiovascular: No JVD. S1-S2 present, rhythmic, no  gallops, rubs, or murmurs. Trace lower extremity edema. Pulmonary: positive breath sounds bilaterally, decreased air movement, no wheezing, no rhonchi, scattered rales. Gastrointestinal. Abdomen with no organomegaly, non tender, no rebound or guarding Skin. No rashes Musculoskeletal: no joint deformities     Data Reviewed: I have personally reviewed following labs and imaging studies  CBC: Recent Labs  Lab 11/12/18 0547 11/13/18 0449  WBC 13.3* 13.2*  NEUTROABS 10.8* 11.0*  HGB 10.7* 10.6*  HCT 33.5* 31.9*  MCV 94.4 91.7  PLT 137* 356*   Basic Metabolic Panel: Recent Labs  Lab 11/12/18 0547 11/13/18 0449 11/14/18 0337  NA 141 142 137  K 4.6 3.9 3.6  CL 111 107 104  CO2 18* 22 22  GLUCOSE 131* 119* 97  BUN 36* 38* 54*  CREATININE 1.66* 1.53* 1.95*  CALCIUM 9.5 9.6 8.9   GFR: Estimated Creatinine Clearance: 19.8 mL/min (A) (by C-G formula based on SCr of 1.95 mg/dL (H)). Liver Function Tests: Recent Labs  Lab 11/12/18 0547  AST 64*  ALT 66*  ALKPHOS 97  BILITOT 1.1  PROT 7.3  ALBUMIN 3.9   No results for input(s): LIPASE, AMYLASE in the last 168 hours. No results for input(s): AMMONIA in the last 168 hours. Coagulation Profile: No results for input(s): INR, PROTIME in the last 168 hours. Cardiac Enzymes: No results for input(s): CKTOTAL, CKMB, CKMBINDEX, TROPONINI in the last 168 hours. BNP (last 3 results) No results for input(s): PROBNP in the last 8760 hours. HbA1C: No results for input(s): HGBA1C in the last 72 hours. CBG: Recent Labs  Lab 11/12/18 1626  GLUCAP 88   Lipid Profile: Recent Labs    11/12/18 0547  TRIG 74   Thyroid Function Tests: No results for input(s): TSH, T4TOTAL, FREET4, T3FREE, THYROIDAB in the last 72 hours. Anemia Panel: Recent Labs    11/12/18 0547  FERRITIN 96      Radiology Studies: I have reviewed all of the imaging during this hospital visit personally     Scheduled Meds: . amLODipine  10 mg Oral  Daily  . apixaban  2.5 mg Oral BID  . feeding supplement (ENSURE ENLIVE)  237 mL Oral BID BM  . folic acid  1 mg Oral Daily  . guaiFENesin  600 mg Oral BID  . hydrALAZINE  50 mg Oral Q8H  . multivitamin with minerals  1 tablet Oral Daily  . nebivolol  10 mg Oral Daily  . pravastatin  20 mg Oral q1800  . sodium chloride flush  3 mL Intravenous Q12H  . thiamine  100 mg Oral Daily   Or  . thiamine  100 mg Intravenous Daily   Continuous Infusions: . sodium chloride       LOS: 1 day        Merin Borjon Gerome Apley, MD

## 2018-11-14 NOTE — TOC Initial Note (Addendum)
Transition of Care [facility]) - Initial/Assessment Note    Patient Details  Name: Rhonda Sharp MRN: [id number] Date of Birth: November 20, 1928  [facility] of [facility]) CM/SW Contact:    Candie Chroman, LCSW Phone Number: 11/14/2018, 9:32 AM  Clinical Narrative: Patient not fully oriented. CSW called daughter, introduced role, and explained that PT recommendations would be discussed. Patient's daughter agreeable to home health and prefers [facility] because they worked with her after her admission in February. Daughter wants PT and RN. [facility] representative will call CSW back after meeting. Patient has a walker, cane, and elevated toilet seat at home. No other DME needs. No further concerns. CSW encouraged patient's daughter to contact CSW as needed. CSW will continue to follow patient and her daughter for support and facilitate return home once stable for discharge.                 10:53 am: Referral made to [facility] with [facility] for home health PT, RN.  Expected Discharge Plan: [place] Barriers to Discharge: Continued Medical Work up   Patient Goals and CMS Choice Patient states their goals for this hospitalization and ongoing recovery are:: Patient not fully oriented. In regards to getting patient home, daughter said "the sooner the better."   Choice offered to / list presented to : NA  Expected Discharge Plan and Services Expected Discharge Plan: [place]   Discharge Planning Services: CM Consult Post Acute Care Choice: [place] arrangements for the past 2 months: Single Family Home                 DME Arranged: N/A         HH Arranged: PT, RN [place] Agency: [facility] Agency Contacted: 11/14/18   Representative spoke with at [place]: Adela Lank  Prior Living Arrangements/Services Living arrangements for the past 2 months: Single Family Home Lives with:: Adult Children Patient language and need for interpreter  reviewed:: Yes(No needs.) Do you feel safe going back to the place where you live?: Yes      Need for Family Participation in Patient Care: Yes (Comment) Care giver support system in place?: Yes (comment) Current home services: DME Criminal Activity/Legal Involvement Pertinent to Current Situation/Hospitalization: No - Comment as needed  Activities of Daily Living Home Assistive Devices/Equipment: Environmental consultant (specify type)(roller at home and cane) ADL Screening (condition at time of admission) Patient's cognitive ability adequate to safely complete daily activities?: No Is the patient deaf or have difficulty hearing?: No Does the patient have difficulty seeing, even when wearing glasses/contacts?: No Does the patient have difficulty concentrating, remembering, or making decisions?: Yes Patient able to express need for assistance with ADLs?: Yes Does the patient have difficulty dressing or bathing?: No Independently performs ADLs?: No Communication: Independent Does the patient have difficulty walking or climbing stairs?: Yes Weakness of Legs: Both Weakness of Arms/Hands: Both  Permission Sought/Granted Permission sought to share information with : Facility Sport and exercise psychologist, Family Supports Permission granted to share information with : (Patient not fully oriented.)  Share Information with NAME: Claiborne Billings  Permission granted to share info w AGENCY: [place] granted to share info w Relationship: Daughter  Permission granted to share info w Contact Information: [phone]  Emotional Assessment Appearance:: Appears stated age Attitude/Demeanor/Rapport: Unable to Assess Affect (typically observed): Unable to Assess Orientation: : Oriented to Self, Oriented to Place, Oriented to  Time Alcohol / Substance Use: Alcohol  Use Psych Involvement: No (comment)  Admission diagnosis:  SOB (shortness of breath) [R06.02] Hypoxia [R09.02] Acute on chronic  congestive heart failure, unspecified heart failure type [facility]) [I50.9] Patient Active Problem List   Diagnosis Date Noted  . Leukocytosis 11/12/2018  . Acute exacerbation of CHF (congestive heart failure) ([place]) 11/12/2018  . CKD (chronic kidney disease), stage III ([place]) 11/12/2018  . Dementia ([place]) 11/12/2018  . Subacute confusional state   . CHF (congestive heart failure), NYHA class I, acute, systolic ([place])   . Pleural effusion, bilateral   . CAP (community acquired pneumonia) 06/06/2018  . Chronic deep vein thrombosis (DVT) of popliteal vein of left lower extremity ([place]) 06/06/2018  . Acute respiratory failure with hypoxia ([place]) 06/05/2018  . Thrombocytopenia ([place]) 05/24/2018  . Alcohol abuse, daily use 05/24/2018  . AKI (acute kidney injury) ([place]) 05/24/2018  . Cerebral thrombosis with cerebral infarction 05/23/2018  . Cerebrovascular accident (CVA) ([place])   . Acute encephalopathy 05/22/2018  . Essential hypertension 05/22/2018   PCP:  Haywood Pao, MD Pharmacy:   [facility] [place], [place] - [address] [place] [address] [place] [address] [place] [address] Phone: [phone] Fax: [phone]     Social Determinants of Health (SDOH) Interventions    Readmission Risk Interventions No flowsheet data found.

## 2018-11-14 NOTE — Progress Notes (Signed)
Physical Therapy Treatment Patient Details Name: Rhonda Sharp MRN: [id number] DOB: 1928/08/31 Today's Date: 11/14/2018    History of Present Illness 85 yo admitted with SOB with presumed acute diastolic dysfunction in setting of hypertensive urgency . PMhx: HTN, CVa, CKD, glaucoma, gout, breast CA, ?dementia    PT Comments    Patient doing well with therapy today, ambulating short distances on RA without desat or DOE. supervision level for OOB mobility with RW. Agree with HHPT recs.   Follow Up Recommendations  Home health PT;Supervision/Assistance - 24 hour     Equipment Recommendations  Rolling walker with 5" wheels    Recommendations for Other Services       Precautions / Restrictions Precautions Precautions: Fall Restrictions Weight Bearing Restrictions: No    Mobility  Bed Mobility Overal bed mobility: Needs Assistance Bed Mobility: Supine to Sit;Sit to Supine     Supine to sit: Supervision Sit to supine: Supervision   General bed mobility comments: supervision for safety, increased time but no physical assist   Transfers Overall transfer level: Needs assistance Equipment used: Rolling walker (2 wheeled) Transfers: Sit to/from Stand Sit to Stand: Min guard         General transfer comment: cueing for hand placement and safety, min guard to steady  Ambulation/Gait Ambulation/Gait assistance: Supervision Gait Distance (Feet): 40 Feet Assistive device: Rolling walker (2 wheeled) Gait Pattern/deviations: Step-through pattern;Decreased stride length;Trunk flexed Gait velocity: decreased   General Gait Details: good safety with RW, supervision required as mild instabiltiy. VSS on RA   Stairs             Wheelchair Mobility    Modified Rankin (Stroke Patients Only)       Balance Overall balance assessment: Needs assistance Sitting-balance support: No upper extremity supported;Feet supported Sitting balance-Leahy Scale: Fair      Standing balance support: Bilateral upper extremity supported;During functional activity;No upper extremity supported Standing balance-Leahy Scale: Poor Standing balance comment: able to engage in grooming standing at sink with 0 hand support, but reliant on B UE support dynamically                             Cognition Arousal/Alertness: Awake/alert Behavior During Therapy: WFL for tasks assessed/performed Overall Cognitive Status: Impaired/Different from baseline Area of Impairment: Problem solving;Awareness;Memory                     Memory: Decreased short-term memory   Safety/Judgement: Decreased awareness of deficits   Problem Solving: Slow processing;Requires verbal cues General Comments: pt alert and oriented x 4, able to follow multiple step commands given increased time and cueing       Exercises      General Comments General comments (skin integrity, edema, etc.): VSS- RA SpO2 90-91%, HR 64      Pertinent Vitals/Pain Pain Assessment: No/denies pain    Home Living Family/patient expects to be discharged to:: Private residence Living Arrangements: Children;Other relatives;Spouse/significant other Available Help at Discharge: Family;Available 24 hours/day Type of Home: House Home Access: Stairs to enter   Home Layout: One level Home Equipment: Cane - single point;Walker - 2 wheels;Shower seat;Grab bars - tub/shower Additional Comments: pt reports she lives with spouse, daughter and son-in-law    Prior Function Level of Independence: Independent with assistive device(s)      Comments: pt reports she uses a cane and is independent with ADLs, doesn't drive   PT Goals (current goals can now  be found in the care plan section) Acute Rehab PT Goals Patient Stated Goal: return home with family PT Goal Formulation: With patient Time For Goal Achievement: 11/27/18 Potential to Achieve Goals: Fair Progress towards PT goals: Progressing toward  goals    Frequency    Min 3X/week      PT Plan Current plan remains appropriate    Co-evaluation              AM-PAC PT "6 Clicks" Mobility   Outcome Measure  Help needed turning from your back to your side while in a flat bed without using bedrails?: A Little Help needed moving from lying on your back to sitting on the side of a flat bed without using bedrails?: A Little Help needed moving to and from a bed to a chair (including a wheelchair)?: A Little Help needed standing up from a chair using your arms (e.g., wheelchair or bedside chair)?: A Little Help needed to walk in hospital room?: A Little Help needed climbing 3-5 steps with a railing? : A Lot 6 Click Score: 17    End of Session Equipment Utilized During Treatment: Gait belt Activity Tolerance: Patient tolerated treatment well Patient left: in chair;with call bell/phone within reach;with chair alarm set;with nursing/sitter in room Nurse Communication: Mobility status PT Visit Diagnosis: Other abnormalities of gait and mobility (R26.89);Muscle weakness (generalized) (M62.81);Unsteadiness on feet (R26.81)     Time: 1725-1740 PT Time Calculation (min) (ACUTE ONLY): 15 min  Charges:  $Gait Training: 8-22 mins                     Reinaldo Berber, PT, DPT Acute Rehabilitation Services Pager: [phone] Office: [phone]     Reinaldo Berber 11/14/2018, 5:42 PM

## 2018-11-14 NOTE — Evaluation (Signed)
Occupational Therapy Evaluation Patient Details Name: Rhonda Sharp MRN: [id number] DOB: 01-28-1929 Today's Date: 11/14/2018    History of Present Illness 85 yo admitted with SOB with presumed acute diastolic dysfunction in setting of hypertensive urgency . PMhx: HTN, CVa, CKD, glaucoma, gout, breast CA, ?dementia   Clinical Impression   PTA patient independent using RW/cane for mobility, independent for ADLs, limited IADLs. Admitted for above and limited by problem list below, including generalized weakness and decreased activity tolerance. Pt requires min guard for LB ADLs, min guard to min assist for transfers, and min guard for mobility in room using RW. Cognitively, pt alert and oriented, follows commands with increased time but has decreased awareness of deficits and decreased STM.  Patient will benefit from continued OT services while admitted and after dc at [facility] level in order to maximize independence and safety with ADls/mobility.     Follow Up Recommendations  Home health OT;Supervision/Assistance - 24 hour    Equipment Recommendations  None recommended by OT    Recommendations for Other Services       Precautions / Restrictions Precautions Precautions: Fall Restrictions Weight Bearing Restrictions: No      Mobility Bed Mobility Overal bed mobility: Needs Assistance Bed Mobility: Supine to Sit;Sit to Supine     Supine to sit: Supervision Sit to supine: Supervision   General bed mobility comments: supervision for safety, increased time but no physical assist   Transfers Overall transfer level: Needs assistance Equipment used: Rolling walker (2 wheeled) Transfers: Sit to/from Stand Sit to Stand: Min guard         General transfer comment: cueing for hand placement and safety, min guard to steady    Balance Overall balance assessment: Needs assistance Sitting-balance support: No upper extremity supported;Feet supported Sitting balance-Leahy Scale:  Fair     Standing balance support: Bilateral upper extremity supported;During functional activity;No upper extremity supported Standing balance-Leahy Scale: Poor Standing balance comment: able to engage in grooming standing at sink with 0 hand support, but reliant on B UE support dynamically                            ADL either performed or assessed with clinical judgement   ADL Overall ADL's : Needs assistance/impaired     Grooming: Min guard;Standing   Upper Body Bathing: Set up;Sitting   Lower Body Bathing: Min guard;Sit to/from stand   Upper Body Dressing : Set up;Sitting   Lower Body Dressing: Min guard;Sit to/from stand   Toilet Transfer: Ambulation;Minimal Radiographer, therapeutic           Functional mobility during ADLs: Min guard;Rolling walker General ADL Comments: limited by generalized weakness and decreased act tolerance     Vision         Perception     Praxis      Pertinent Vitals/Pain Pain Assessment: No/denies pain     Hand Dominance     Extremity/Trunk Assessment Upper Extremity Assessment Upper Extremity Assessment: Generalized weakness   Lower Extremity Assessment Lower Extremity Assessment: Defer to PT evaluation   Cervical / Trunk Assessment Cervical / Trunk Assessment: Kyphotic   Communication Communication Communication: No difficulties   Cognition Arousal/Alertness: Awake/alert Behavior During Therapy: WFL for tasks assessed/performed Overall Cognitive Status: Impaired/Different from baseline Area of Impairment: Problem solving;Awareness;Memory                     Memory: Decreased short-term memory   Safety/Judgement: Decreased awareness  of deficits   Problem Solving: Slow processing;Requires verbal cues General Comments: pt alert and oriented x 4, able to follow multiple step commands given increased time and cueing    General Comments  VSS- RA SpO2 90-91%, HR 64    Exercises     Shoulder  Instructions      Home Living Family/patient expects to be discharged to:: Private residence Living Arrangements: Children;Other relatives;Spouse/significant other Available Help at Discharge: Family;Available 24 hours/day Type of Home: House Home Access: Stairs to enter [employer] of Steps: 3   Home Layout: One level     Bathroom Shower/Tub: Occupational psychologist: Handicapped height     Home Equipment: [place] - single point;Walker - 2 wheels;Shower seat;Grab bars - tub/shower   Additional Comments: pt reports she lives with spouse, daughter and son-in-law      Prior Functioning/Environment Level of Independence: Independent with assistive device(s)        Comments: pt reports she uses a cane and is independent with ADLs, doesn't drive        OT Problem List: Decreased strength;Decreased activity tolerance;Impaired balance (sitting and/or standing);Decreased knowledge of use of DME or AE;Decreased knowledge of precautions      OT Treatment/Interventions: Self-care/ADL training;DME and/or AE instruction;Therapeutic activities;Patient/family education;Balance training    OT Goals(Current goals can be found in the care plan section) Acute Rehab OT Goals Patient Stated Goal: return home with family OT Goal Formulation: With patient Time For Goal Achievement: 11/28/18 Potential to Achieve Goals: Good  OT Frequency: Min 2X/week   Barriers to D/C:            Co-evaluation              AM-PAC OT "6 Clicks" Daily Activity     Outcome Measure Help from another person eating meals?: None Help from another person taking care of personal grooming?: A Little Help from another person toileting, which includes using toliet, bedpan, or urinal?: A Little Help from another person bathing (including washing, rinsing, drying)?: A Little Help from another person to put on and taking off regular upper body clothing?: None Help from another person to put  on and taking off regular lower body clothing?: A Little 6 Click Score: 20   End of Session Equipment Utilized During Treatment: Gait belt;Rolling walker Nurse Communication: Mobility status  Activity Tolerance: Patient tolerated treatment well Patient left: in bed;with call bell/phone within reach;with bed alarm set  OT Visit Diagnosis: Unsteadiness on feet (R26.81);Muscle weakness (generalized) (M62.81)                Time: 1421-1440 OT Time Calculation (min): 19 min Charges:  OT General Charges $OT Visit: 1 Visit OT Evaluation $OT Eval Low Complexity: [place], OT Acute Rehabilitation Services Pager [phone] Office [phone]   Delight Stare 11/14/2018, 3:02 PM

## 2018-11-14 NOTE — Progress Notes (Signed)
Progress Note  Patient Name: Rhonda Sharp Date of Encounter: 11/14/2018  Primary Cardiologist: Candee Furbish, MD   Subjective   Confusion improved.  Breathing improved.  Inpatient Medications    Scheduled Meds: . amLODipine  10 mg Oral Daily  . apixaban  2.5 mg Oral BID  . feeding supplement (ENSURE ENLIVE)  237 mL Oral BID BM  . folic acid  1 mg Oral Daily  . furosemide  20 mg Intravenous BID  . guaiFENesin  600 mg Oral BID  . hydrALAZINE  50 mg Oral Q8H  . multivitamin with minerals  1 tablet Oral Daily  . nebivolol  10 mg Oral Daily  . pravastatin  20 mg Oral q1800  . sodium chloride flush  3 mL Intravenous Q12H  . thiamine  100 mg Oral Daily   Or  . thiamine  100 mg Intravenous Daily   Continuous Infusions: . sodium chloride     PRN Meds: sodium chloride, acetaminophen, LORazepam **OR** LORazepam, ondansetron (ZOFRAN) IV, sodium chloride flush   Vital Signs    Vitals:   11/13/18 2048 11/14/18 0600 11/14/18 0631 11/14/18 0650  BP: (!) 120/58  (!) 146/71   Pulse: 63  64   Resp: 20  20   Temp: 98.7 F (37.1 C)  99.6 F (37.6 C)   TempSrc: Oral  Oral   SpO2: 90%  (!) 89% 94%  Weight:  64 kg    Height:        Intake/Output Summary (Last 24 hours) at 11/14/2018 9892 Last data filed at 11/14/2018 0600 Gross per 24 hour  Intake 240 ml  Output 850 ml  Net -610 ml   Last 3 Weights 11/14/2018 11/13/2018 11/12/2018  Weight (lbs) 141 lb 149 lb 141 lb 4.8 oz  Weight (kg) 63.957 kg 67.586 kg 64.093 kg      Telemetry    Sinus rhythm with occasional PACs- Personally Reviewed  ECG    Sinus rhythm 69 early repolarization- Personally Reviewed  Physical Exam  Elderly GEN: No acute distress.   Neck: No JVD, kyphosis Cardiac: RRR, no murmurs, rubs, or gallops.  Respiratory:  Mild crackles at bases bilaterally. GI: Soft, nontender, non-distended  MS: No edema; No deformity. Neuro:  Nonfocal  Psych: Mild confusion noted  Labs    High Sensitivity Troponin:    Recent Labs  Lab 11/12/18 1602  TROPONINIHS 12      Cardiac EnzymesNo results for input(s): TROPONINI in the last 168 hours. No results for input(s): TROPIPOC in the last 168 hours.   Chemistry Recent Labs  Lab 11/12/18 0547 11/13/18 0449 11/14/18 0337  NA 141 142 137  K 4.6 3.9 3.6  CL 111 107 104  CO2 18* 22 22  GLUCOSE 131* 119* 97  BUN 36* 38* 54*  CREATININE 1.66* 1.53* 1.95*  CALCIUM 9.5 9.6 8.9  PROT 7.3  --   --   ALBUMIN 3.9  --   --   AST 64*  --   --   ALT 66*  --   --   ALKPHOS 97  --   --   BILITOT 1.1  --   --   GFRNONAA 27* 30* 23*  GFRAA 32* 35* 26*  ANIONGAP 12 13 11      Hematology Recent Labs  Lab 11/12/18 0547 11/13/18 0449  WBC 13.3* 13.2*  RBC 3.55* 3.48*  HGB 10.7* 10.6*  HCT 33.5* 31.9*  MCV 94.4 91.7  MCH 30.1 30.5  MCHC 31.9 33.2  RDW  14.3 14.2  PLT 137* 136*    BNP Recent Labs  Lab 11/12/18 0548  BNP 984.4*     DDimer  Recent Labs  Lab 11/12/18 0547  DDIMER 0.78*     Radiology    No results found.  Cardiac Studies   Echocardiogram-EF normal  Patient Profile     85 y.o. female with prior stroke, DVT on Eliquis, diastolic heart failure, hyperlipidemia, mild dementia.  Assessment & Plan    Acute on chronic diastolic heart failure Essential hypertension somewhat labile Prior DVT on Eliquis Chronic kidney disease stage III Dementia  -Her BUN and creatinine have now begun to rise which indicates prerenal azotemia in the setting of diuresis.  We will stop her IV Lasix. -Tomorrow should be able to go back to p.o. Lasix as she was taking at home. -Continue to counsel on dietary restrictions as well as fluid restrictions. -Seems clearer this morning from mental aspect.     For questions or updates, please contact [place] Please consult [email or web address] for contact info under        Signed, Candee Furbish, MD  11/14/2018, 8:22 AM

## 2018-11-14 NOTE — Progress Notes (Signed)
Daughter [name] updated with plan of care.

## 2018-11-15 LAB — BASIC METABOLIC PANEL
Anion gap: 10 (ref 5–15)
BUN: 71 mg/dL — ABNORMAL HIGH (ref 8–23)
CO2: 22 mmol/L (ref 22–32)
Calcium: 8.9 mg/dL (ref 8.9–10.3)
Chloride: 105 mmol/L (ref 98–111)
Creatinine, Ser: 2.06 mg/dL — ABNORMAL HIGH (ref 0.44–1.00)
GFR calc Af Amer: 24 mL/min — ABNORMAL LOW (ref >60)
GFR calc non Af Amer: 21 mL/min — ABNORMAL LOW (ref >60)
Glucose, Bld: 98 mg/dL (ref 70–99)
Potassium: 3.8 mmol/L (ref 3.5–5.1)
Sodium: 137 mmol/L (ref 135–145)

## 2018-11-15 NOTE — Progress Notes (Signed)
PROGRESS NOTE    Rhonda Sharp  MAU:[id number] DOB: 03-24-1929 DOA: 11/12/2018 PCP: Haywood Pao, MD    Brief Narrative:  85 year old female who presented with dyspnea.  She does have significant past medical history for hypertension, CVA, dyslipidemia, stage III chronic kidney disease, diastolic heart failure, deep vein thrombosis, breast cancer and mild dementia.  Patient reported dyspnea for about 24 hours, associated with palpitations, dry cough, generalized weakness and ankle swelling.  On the day of admission her oxygenation was 88% on room air.  Apparently she had a recent travel to the beach.  On her initial physical examination blood pressure 160/90, heart rate 66, respiratory 23, temperature 99.3, oxygen saturation 95% on supplemental oxygen.  Moist mucous membranes, her lungs had bibasilar rales, heart S1-S2 present and rhythmic, abdomen soft nontender, positive lower extremity edema.  Chest radiograph with bilateral lower lobe interstitial infiltrates, vascular congestion.  EKG 64 bpm, normal axis, normal intervals, sinus rhythm with multiple PACs, no ST segment or T wave changes.  Patient was admitted to the hospital with the working diagnosis acute on chronic diastolic heart failure decompensation.   Assessment & Plan:   Active Problems:   Essential hypertension   Thrombocytopenia (HCC)   Acute respiratory failure with hypoxia (HCC)   Chronic deep vein thrombosis (DVT) of popliteal vein of left lower extremity (HCC)   Leukocytosis   Acute exacerbation of CHF (congestive heart failure) (HCC)   CKD (chronic kidney disease), stage III ([place])   Dementia ([place])   1. Acute on chronic diastolic heart failure. Patient with improbed symptoms, urine out put documented is 750 ml. Holding diuresis for now due to worsening renal function. Will continue heart failure management with nebivolol and tid hydralazine.   2. AKI on CKD stage 3. Continue worsening renal function with  serum cr at 2,0, K is 3,8 and serum bicarbonate is 22. Continue to hold on furosemide, avoid nephrotoxic medications, or hypotension, expected recovery in the next 24h, follow on renal panel in am.  3. Chronic DVT. On apixaban with good toleration.    4. HTN. On amlodipine and 50 mg hydralazine tid for blood pressure control.  5. Dyslipidemia. On pravastatin.   DVT prophylaxis: apixaban  Code Status:  full Family Communication:  No family at the bedside  Disposition Plan/ discharge barriers: pending clinical improvement  Body mass index is 23.65 kg/m. Malnutrition Type:  Nutrition Problem: Inadequate oral intake Etiology: decreased appetite   Malnutrition Characteristics:  Signs/Symptoms: meal completion < 25%   Nutrition Interventions:  Interventions: Ensure Enlive (each supplement provides 350kcal and 20 grams of protein), Magic cup, MVI  RN Pressure Injury Documentation:     Consultants:   Cardiology so  Procedures:     Antimicrobials:       Subjective: Patient is feeling better, dyspnea has improved, no chest pain, noted bilateral feet pain today, worse with walking, no radiation, moderate in intensity, no improving factors.   Objective: Vitals:   11/14/18 2006 11/15/18 0543 11/15/18 0729 11/15/18 1150  BP: (!) 151/64 (!) 153/71  128/65  Pulse: 64 (!) 58  (!) 55  Resp: 20 20  20   Temp: 97.7 F (36.5 C) 98.6 F (37 C)  98.2 F (36.8 C)  TempSrc: Oral Oral  Oral  SpO2: 95% 93%  94%  Weight:   68.5 kg   Height:        Intake/Output Summary (Last 24 hours) at 11/15/2018 1227 Last data filed at 11/15/2018 0810 Gross per 24 hour  Intake 1200 ml  Output 950 ml  Net 250 ml   Filed Weights   11/13/18 0338 11/14/18 0600 11/15/18 0729  Weight: 67.6 kg 64 kg 68.5 kg    Examination:   General: Not in pain or dyspnea, deconditioned  Neurology: Awake and alert, non focal  E ENT: mild pallor, no icterus, oral mucosa moist Cardiovascular: No JVD.  S1-S2 present, rhythmic, no gallops, rubs, or murmurs. No lower extremity edema. Pulmonary: positive breath sounds bilaterally, adequate air movement, no wheezing, rhonchi or rales. Gastrointestinal. Abdomen with no organomegaly, non tender, no rebound or guarding Skin. No rashes Musculoskeletal: no joint deformities     Data Reviewed: I have personally reviewed following labs and imaging studies  CBC: Recent Labs  Lab 11/12/18 0547 11/13/18 0449  WBC 13.3* 13.2*  NEUTROABS 10.8* 11.0*  HGB 10.7* 10.6*  HCT 33.5* 31.9*  MCV 94.4 91.7  PLT 137* 938*   Basic Metabolic Panel: Recent Labs  Lab 11/12/18 0547 11/13/18 0449 11/14/18 0337 11/15/18 0514  NA 141 142 137 137  K 4.6 3.9 3.6 3.8  CL 111 107 104 105  CO2 18* 22 22 22   GLUCOSE 131* 119* 97 98  BUN 36* 38* 54* 71*  CREATININE 1.66* 1.53* 1.95* 2.06*  CALCIUM 9.5 9.6 8.9 8.9   GFR: Estimated Creatinine Clearance: 18.7 mL/min (A) (by C-G formula based on SCr of 2.06 mg/dL (H)). Liver Function Tests: Recent Labs  Lab 11/12/18 0547  AST 64*  ALT 66*  ALKPHOS 97  BILITOT 1.1  PROT 7.3  ALBUMIN 3.9   No results for input(s): LIPASE, AMYLASE in the last 168 hours. No results for input(s): AMMONIA in the last 168 hours. Coagulation Profile: No results for input(s): INR, PROTIME in the last 168 hours. Cardiac Enzymes: No results for input(s): CKTOTAL, CKMB, CKMBINDEX, TROPONINI in the last 168 hours. BNP (last 3 results) No results for input(s): PROBNP in the last 8760 hours. HbA1C: No results for input(s): HGBA1C in the last 72 hours. CBG: Recent Labs  Lab 11/12/18 1626  GLUCAP 88   Lipid Profile: No results for input(s): CHOL, HDL, LDLCALC, TRIG, CHOLHDL, LDLDIRECT in the last 72 hours. Thyroid Function Tests: No results for input(s): TSH, T4TOTAL, FREET4, T3FREE, THYROIDAB in the last 72 hours. Anemia Panel: No results for input(s): VITAMINB12, FOLATE, FERRITIN, TIBC, IRON, RETICCTPCT in the last 72  hours.    Radiology Studies: I have reviewed all of the imaging during this hospital visit personally     Scheduled Meds: . amLODipine  10 mg Oral Daily  . apixaban  2.5 mg Oral BID  . feeding supplement (ENSURE ENLIVE)  237 mL Oral BID BM  . folic acid  1 mg Oral Daily  . guaiFENesin  600 mg Oral BID  . hydrALAZINE  50 mg Oral Q8H  . multivitamin with minerals  1 tablet Oral Daily  . nebivolol  10 mg Oral Daily  . pravastatin  20 mg Oral q1800  . sodium chloride flush  3 mL Intravenous Q12H  . thiamine  100 mg Oral Daily   Or  . thiamine  100 mg Intravenous Daily   Continuous Infusions: . sodium chloride       LOS: 2 days        Mauricio Gerome Apley, MD

## 2018-11-15 NOTE — Progress Notes (Addendum)
Progress Note  Patient Name: Rhonda Sharp Date of Encounter: 11/15/2018  Primary Cardiologist: Candee Furbish, MD   Subjective   No significant overnight events. Patient feeling bettering and wondering when she can go home. She states her breathing has improved. She is able to lay flat at night. No chest pain, palpitations, lightheadedness, or dizziness. Her only complaint was some pain at the base of both of her big toes. No lower leg pain or calf tenderness.   Inpatient Medications    Scheduled Meds: . amLODipine  10 mg Oral Daily  . apixaban  2.5 mg Oral BID  . feeding supplement (ENSURE ENLIVE)  237 mL Oral BID BM  . folic acid  1 mg Oral Daily  . guaiFENesin  600 mg Oral BID  . hydrALAZINE  50 mg Oral Q8H  . multivitamin with minerals  1 tablet Oral Daily  . nebivolol  10 mg Oral Daily  . pravastatin  20 mg Oral q1800  . sodium chloride flush  3 mL Intravenous Q12H  . thiamine  100 mg Oral Daily   Or  . thiamine  100 mg Intravenous Daily   Continuous Infusions: . sodium chloride     PRN Meds: sodium chloride, acetaminophen, ondansetron (ZOFRAN) IV, sodium chloride flush   Vital Signs    Vitals:   11/14/18 1138 11/14/18 2006 11/15/18 0543 11/15/18 0729  BP: (!) 143/62 (!) 151/64 (!) 153/71   Pulse: 64 64 (!) 58   Resp: 16 20 20    Temp: 98.3 F (36.8 C) 97.7 F (36.5 C) 98.6 F (37 C)   TempSrc: Oral Oral Oral   SpO2: 96% 95% 93%   Weight:    68.5 kg  Height:        Intake/Output Summary (Last 24 hours) at 11/15/2018 0741 Last data filed at 11/15/2018 0552 Gross per 24 hour  Intake 1443 ml  Output 750 ml  Net 693 ml   Last 3 Weights 11/15/2018 11/14/2018 11/13/2018  Weight (lbs) 151 lb 141 lb 149 lb  Weight (kg) 68.493 kg 63.957 kg 67.586 kg      Telemetry    Sinus rhythm with rates in the 50's to 60's and a few PACs. - Personally Reviewed  ECG    No new ECG tracing today. - Personally Reviewed  Physical Exam   GEN: Elderly Caucasian female  sitting comfortably in chair in no acute distress.   Neck: Supple. No JVD appreciated. Cardiac: RRR. No murmurs, rubs, or gallops.  Respiratory: No increased work of breathing. Some decreased breath sounds bilaterally with mild crackles noted in bases. No wheezes or rhonchi. GI: Abdomen soft, non-distended, and non-tender. Bowel sounds present. MS: No lower extremity edema. No deformity. Skin: Warm and dry. Neuro:  No focal deficits. Psych: Normal affect. Responds appropriately.  Labs    High Sensitivity Troponin:   Recent Labs  Lab 11/12/18 1602  TROPONINIHS 12      Cardiac EnzymesNo results for input(s): TROPONINI in the last 168 hours. No results for input(s): TROPIPOC in the last 168 hours.   Chemistry Recent Labs  Lab 11/12/18 0547 11/13/18 0449 11/14/18 0337 11/15/18 0514  NA 141 142 137 137  K 4.6 3.9 3.6 3.8  CL 111 107 104 105  CO2 18* 22 22 22   GLUCOSE 131* 119* 97 98  BUN 36* 38* 54* 71*  CREATININE 1.66* 1.53* 1.95* 2.06*  CALCIUM 9.5 9.6 8.9 8.9  PROT 7.3  --   --   --  ALBUMIN 3.9  --   --   --   AST 64*  --   --   --   ALT 66*  --   --   --   ALKPHOS 97  --   --   --   BILITOT 1.1  --   --   --   GFRNONAA 27* 30* 23* 21*  GFRAA 32* 35* 26* 24*  ANIONGAP 12 13 11 10      Hematology Recent Labs  Lab 11/12/18 0547 11/13/18 0449  WBC 13.3* 13.2*  RBC 3.55* 3.48*  HGB 10.7* 10.6*  HCT 33.5* 31.9*  MCV 94.4 91.7  MCH 30.1 30.5  MCHC 31.9 33.2  RDW 14.3 14.2  PLT 137* 136*    BNP Recent Labs  Lab 11/12/18 0548  BNP 984.4*     DDimer  Recent Labs  Lab 11/12/18 0547  DDIMER 0.78*     Radiology    No results found.  Cardiac Studies   Echocardiogram 11/12/2018: Impressions: 1. The left ventricle has low normal systolic function, with an ejection fraction of 50-55%. The cavity size was normal. There is mild concentric left ventricular hypertrophy. Left ventricular diastolic Doppler parameters are consistent with   pseudonormalization. No evidence of left ventricular regional wall motion abnormalities.  2. The right ventricle has normal systolic function. The cavity was normal. There is no increase in right ventricular wall thickness. Right ventricular systolic pressure is mildly elevated.  3. The aortic valve is tricuspid. No stenosis of the aortic valve.  4. The inferior vena cava was normal in size with <50% respiratory variability.  5. The interatrial septum was not assessed.  Patient Profile   Ms. Rhonda Sharp is a 85 y.o. female with a history of chronic diastolic CHF, CVA, DVT on Eliquis, labile hypertension, hyperlipidemia, breast cancer s/p radiation, CKD stage III, and mild dementia who is being seen today for the evaluation of acute CHF at the request of Dr. Tamala Julian.   Assessment & Plan    Acute on Chronic Diastolic Dysfunction - BNP elevated at 984.4. - Echo this admission showed LVEF of 50-55% with mild concentric LVH and diastolic doppler parameters consistent with pseudonormalization (grade 2 diastolic dysfunction) but no regional wall motion abnormalities. - Patient was diuresed with IV Lasix but this was stopped yesterday due to rise in creatinine and BUN. Net negative 1.5 L since admission. Weight 151 lbs today, up from 141 lbs yesterday (don't think this is accurate). Creatinine 2.06 and BUN 71 today, up from 1.95 and 54 respectively. - Will defer additional Lasix with MD since creatinine and BUN continue to rise. - Continue to work on BP control. - Continue to monitor daily weights, strict I/O's, and renal function.  Hypertension - Somewhat Labile - Systolic BP has been mostly in the 140's to 150's over the past 24 hours. Most recent BP 153/71. - Continue Amlodipine 10mg  daily, Bystolic 10mg  daily, and Hydralazine 50mg  three times daily. - Will defer any medication adjustments to primary team.  Prior DVT - Diagnosed with chronic DVT in the left popliteal vein in 05/2018. - Continue  Eliquis.  CKD Stage III - Serum creatinine 1.66 on admission. Baseline appears to be around 1.2 to 1.7. - Creatinine 2.06 and BUN 71 today, up from 1.95 and 54 respectively. - Continue to avoid Nephrotoxic agents.  - Continue daily BMET.  Leukocytosis - WBC 13.2 on 7/7. - Patient afebrile. - COVID-19 negative. - Management per primary team.  Otherwise, per primary team.  For questions or updates, please contact [place] Please consult [email or web address] for contact info under        Signed, Darreld Mclean, PA-C  11/15/2018, 7:42 AM    Personally seen and examined. Agree with above.   Overall sitting in chair, breathing is improved however renal function has worsened.  Prerenal azotemia likely from diuresis.  She still asking to go home.  I would continue to hold her diuretic until she shows trending improvement of BUN and creatinine.  No further recommendations at this time.  We will go ahead and sign off.  Candee Furbish, MD

## 2018-11-15 NOTE — Progress Notes (Signed)
Physical Therapy Treatment Patient Details Name: Rhonda Sharp MRN: [id number] DOB: 1928-10-10 Today's Date: 11/15/2018    History of Present Illness 85 yo admitted with SOB with presumed acute diastolic dysfunction in setting of hypertensive urgency . PMhx: HTN, CVa, CKD, glaucoma, gout, breast CA, ?dementia    PT Comments    Pt alert, pleasant and oriented this session. Pt able to clarify that she does not live with spouse as he passed 21 years ago but does live with daughter and son-in-law and recently returned from [facility]. Pt with significantly improved activity tolerance although fatigue noted end of session. Pt deferred further HEP or activity due to breakfast present. Will continue to follow to maximize independence.     Follow Up Recommendations  Home health PT;Supervision/Assistance - 24 hour     Equipment Recommendations  Rolling walker with 5" wheels    Recommendations for Other Services       Precautions / Restrictions Precautions Precautions: Fall Restrictions Weight Bearing Restrictions: No    Mobility  Bed Mobility Overal bed mobility: Modified Independent Bed Mobility: Supine to Sit           General bed mobility comments: pt able to transition from supine to sit safely with bed flat and minor use of rail  Transfers Overall transfer level: Needs assistance   Transfers: Sit to/from Stand Sit to Stand: Supervision         General transfer comment: cueing for hand placement x 1 with pt able to demonstrate after education  Ambulation/Gait Ambulation/Gait assistance: Supervision Gait Distance (Feet): 300 Feet Assistive device: Rolling walker (2 wheeled) Gait Pattern/deviations: Step-through pattern;Decreased stride length   Gait velocity interpretation: >2.62 ft/sec, indicative of community ambulatory General Gait Details: pt with supervision for direction with good stability with RW, limited by fatigue   Stairs Stairs: Yes Stairs  assistance: Modified independent (Device/Increase time) Stair Management: Two rails;Alternating pattern;Forwards Number of Stairs: 3 General stair comments: safe stair ambulation with use of railings   Wheelchair Mobility    Modified Rankin (Stroke Patients Only)       Balance Overall balance assessment: Mild deficits observed, not formally tested   Sitting balance-Leahy Scale: Good       Standing balance-Leahy Scale: Poor Standing balance comment: pt able to stand with single UE support and bil UE support for gait                            Cognition Arousal/Alertness: Awake/alert Behavior During Therapy: WFL for tasks assessed/performed Overall Cognitive Status: Within Functional Limits for tasks assessed                                 General Comments: pt A&Ox4, following all commands but does not recall the last 2 days. Pt reports she had a previous episode of significant confusion in January      Exercises      General Comments        Pertinent Vitals/Pain Pain Assessment: No/denies pain    Home Living                      Prior Function            PT Goals (current goals can now be found in the care plan section) Progress towards PT goals: Progressing toward goals    Frequency    Min 3X/week  PT Plan Current plan remains appropriate    Co-evaluation              AM-PAC PT "6 Clicks" Mobility   Outcome Measure  Help needed turning from your back to your side while in a flat bed without using bedrails?: None Help needed moving from lying on your back to sitting on the side of a flat bed without using bedrails?: None Help needed moving to and from a bed to a chair (including a wheelchair)?: A Little Help needed standing up from a chair using your arms (e.g., wheelchair or bedside chair)?: A Little Help needed to walk in hospital room?: A Little Help needed climbing 3-5 steps with a railing? :  None 6 Click Score: 21    End of Session Equipment Utilized During Treatment: Gait belt Activity Tolerance: Patient tolerated treatment well Patient left: in chair;with call bell/phone within reach;with chair alarm set Nurse Communication: Mobility status PT Visit Diagnosis: Other abnormalities of gait and mobility (R26.89);Muscle weakness (generalized) (M62.81);Unsteadiness on feet (R26.81)     Time: 0998-3382 PT Time Calculation (min) (ACUTE ONLY): 18 min  Charges:  $Gait Training: 8-22 mins                     Sybil Shrader Pam Drown, PT Acute Rehabilitation Services Pager: [phone] Office: [place] 11/15/2018, 12:12 PM

## 2018-11-15 NOTE — Progress Notes (Signed)
OT Cancellation Note  Patient Details Name: Rhonda Sharp MRN: [id number] DOB: 10-Apr-1929   Cancelled Treatment:    Reason Eval/Treat Not Completed: Patient declined, no reason specified. Pt refusing OT intervention this morning secondary to reported foot pain. OT encouraging pt to participate and asking if she needed pain medication. Pt verbalized "no" but continued to declined therapeutic intervention at this time. OT will follow up when available.   Gypsy Decant, MS, OTR/L 11/15/2018, 12:15 PM

## 2018-11-16 LAB — BASIC METABOLIC PANEL
Anion gap: 13 (ref 5–15)
BUN: 73 mg/dL — ABNORMAL HIGH (ref 8–23)
CO2: 20 mmol/L — ABNORMAL LOW (ref 22–32)
Calcium: 9.2 mg/dL (ref 8.9–10.3)
Chloride: 105 mmol/L (ref 98–111)
Creatinine, Ser: 2.01 mg/dL — ABNORMAL HIGH (ref 0.44–1.00)
GFR calc Af Amer: 25 mL/min — ABNORMAL LOW (ref >60)
GFR calc non Af Amer: 22 mL/min — ABNORMAL LOW (ref >60)
Glucose, Bld: 106 mg/dL — ABNORMAL HIGH (ref 70–99)
Potassium: 4.2 mmol/L (ref 3.5–5.1)
Sodium: 138 mmol/L (ref 135–145)

## 2018-11-16 MED ORDER — AMLODIPINE BESYLATE 5 MG PO TABS: 5.0000 mg | ORAL_TABLET | Freq: Every day | ORAL | 0 refills | Status: DC

## 2018-11-16 MED ORDER — FUROSEMIDE 40 MG PO TABS: 40.0000 mg | ORAL_TABLET | Freq: Every day | ORAL | 0 refills | Status: DC

## 2018-11-16 MED ORDER — HYDRALAZINE HCL 50 MG PO TABS: 50.0000 mg | ORAL_TABLET | Freq: Two times a day (BID) | ORAL | 0 refills | Status: AC

## 2018-11-16 NOTE — Discharge Summary (Addendum)
Physician Discharge Summary  Rhonda Sharp VQQ:[id number] DOB: 07/10/1928 DOA: 11/12/2018  PCP: Haywood Pao, MD  Admit date: 11/12/2018 Discharge date: 11/16/2018  Admitted From: Home  Disposition:  Home   Recommendations for Outpatient Follow-up and new medication changes:  1. Follow up with Dr. Osborne Casco in 7 days.  2. Continue hydralazine 50 mg bid and will decreased amlodipine to 5 mg daily.  3. Instructed to continue daily furosemide 40 mg daily. 4.  Please follow up renal function in 7 days.    Home Health:  Yes  Equipment/Devices: no    Discharge Condition: stable  CODE STATUS: full  Diet recommendation: heart healthy diet   Brief/Interim Summary: 85 year old female who presented with dyspnea.  She does have significant past medical history for hypertension, CVA, dyslipidemia, stage III chronic kidney disease, diastolic heart failure, history of deep vein thrombosis, breast cancer and mild dementia. Patient reported dyspnea for about 24 hours, associated with palpitations, dry cough, generalized weakness and ankle swelling.  On the day of admission her oxygenation was 88% on room air.  Apparently she had a recent travel to the beach.  On her initial physical examination blood pressure 160/90, heart rate 66, respiratory rate 23, temperature 99.3, oxygen saturation 95% on supplemental oxygen.  Moist mucous membranes, her lungs had bibasilar rales, heart S1-S2 present and rhythmic, abdomen soft nontender, positive lower extremity edema.   Sodium 141 potassium 4.6, chloride 111, bicarb 18, glucose 131, BUN 36, creatinine 1.66, BNP 984,  white count 13.3, hemoglobin 10.7, hematocrit 33.5, platelets 137.  SARS COVID-19 was negative.  Urinalysis negative for infection, specific gravity 1.008, 30 protein.  Chest radiograph with bilateral lower lobe interstitial infiltrates, vascular congestion.  EKG 64 bpm, normal axis, normal intervals, sinus rhythm with multiple PACs, no ST segment or T  wave changes.  Patient was admitted to the hospital with the working diagnosis acute on chronic diastolic heart failure decompensation.  1.  Acute on chronic diastolic heart failure decompensation.  Patient was admitted to the medical ward, she was placed on a remote telemetry monitor, she received aggressive diuresis with IV furosemide, negative fluid balance was achieved, (-1,237 ml) with significant improvement of her symptoms.  Further work-up with echocardiography showed LV systolic function ejection fraction 50 to 55%.  Mild concentric left ventricular hypertrophy.  No significant valvular disease.  Patient will continue taking Bystolic, daily furosemide 40 mg.  Blood pressure control with hydralzine and amloidpine.  Discharge oximetry is 96% on room air.  2.  Acute kidney injury and chronic kidney disease stage III a.  Patient received IV furosemide, her peak creatinine 2.06, she was transitioned to oral furosemide.  Her discharge creatinine is 2.01, potassium 4.2, serum bicarb 20.  She will continue taking furosemide 40 mg daily.  Follow-up kidney function in 7 days.  3.  Hypertension.  Patient was continue with Bystolic and hydralazine and amlodipine.   Her discharge blood pressure 127/53.  Because she had hypotensive episodes at home, amlodipine will be decreased to 5 mg daily. Follow-up blood pressures an outpatient.  4.  Chronic deep vein thrombosis.  Continue anticoagulation with apixaban.  5.  Hypertension.  Patient continue blood pressure control with hydralazine 50 minutes twice daily and Bystolic.  6.  Dyslipidemia.  Continue pravastatin.  Discharge Diagnoses:  Active Problems:   Essential hypertension   Thrombocytopenia (HCC)   Acute respiratory failure with hypoxia (HCC)   Chronic deep vein thrombosis (DVT) of popliteal vein of left lower extremity ([place])  Leukocytosis   Acute exacerbation of CHF (congestive heart failure) (HCC)   CKD (chronic kidney disease), stage III  ([place])   Dementia ([place])    Discharge Instructions   Allergies as of 11/16/2018      Reactions   Sulfa Antibiotics Other (See Comments)   Unknown reaction to pt      Medication List    TAKE these medications   acetaminophen 500 MG tablet Commonly known as: TYLENOL Take 1,000 mg by mouth every 6 (six) hours as needed for mild pain.   amLODipine 5 MG tablet Commonly known as: NORVASC Take 1 tablet (5 mg total) by mouth daily. What changed:   medication strength  how much to take   apixaban 5 MG Tabs tablet Commonly known as: ELIQUIS Take 1 tablet (5 mg total) by mouth 2 (two) times daily.   fexofenadine 180 MG tablet Commonly known as: ALLEGRA Take 180 mg by mouth as needed for allergies or rhinitis.   furosemide 40 MG tablet Commonly known as: Lasix Take 1 tablet (40 mg total) by mouth daily. What changed:   medication strength  how much to take  when to take this   hydrALAZINE 50 MG tablet Commonly known as: APRESOLINE Take 1 tablet (50 mg total) by mouth 2 (two) times a day. What changed: when to take this   nebivolol 10 MG tablet Commonly known as: BYSTOLIC Take 10 mg by mouth daily.   pravastatin 20 MG tablet Commonly known as: PRAVACHOL Take 1 tablet (20 mg total) by mouth daily at 6 PM.       Allergies  Allergen Reactions  . Sulfa Antibiotics Other (See Comments)    Unknown reaction to pt    Consultations:  Cardiology    Procedures/Studies: Dg Chest Portable 1 View  Result Date: 11/12/2018 CLINICAL DATA:  Shortness of breath. EXAM: PORTABLE CHEST 1 VIEW COMPARISON:  06/12/2018.  06/10/2018.  CT 06/05/2018. FINDINGS: Cardiomegaly with pulmonary venous congestion. Bibasilar infiltrates/edema has improved from prior exam. Residual basilar interstitial prominence noted. Mild biapical pleural thickening consistent scarring again noted. No pleural effusion or pneumothorax. IMPRESSION: 1.  Cardiomegaly with pulmonary venous congestion. 2.  Bibasilar infiltrates/edema is improved from prior exam. Residual basilar interstitial prominence noted. Electronically Signed   By: Marcello Moores  Register   On: 11/12/2018 05:55      Procedures:  Subjective: Patient is feeling better, dyspnea has improved, no chest pain, no nausea or vomiting.   Discharge Exam: Vitals:   11/16/18 0520 11/16/18 0839  BP: (!) 114/97 (!) 127/53  Pulse: 62 64  Resp: 20 18  Temp: 99.4 F (37.4 C)   SpO2: 96%    Vitals:   11/15/18 2102 11/16/18 0520 11/16/18 0558 11/16/18 0839  BP: (!) 142/59 (!) 114/97  (!) 127/53  Pulse: (!) 59 62  64  Resp: 20 20  18   Temp: 98.1 F (36.7 C) 99.4 F (37.4 C)    TempSrc: Oral Oral    SpO2: 95% 96%    Weight:   62.8 kg   Height:        General: Not in pain or dyspnea, Neurology: Awake and alert, non focal  E ENT: no pallor, no icterus, oral mucosa moist Cardiovascular: No JVD. S1-S2 present, rhythmic, no gallops, rubs, or murmurs. No lower extremity edema. Pulmonary: positive breath sounds bilaterally, adequate air movement, no wheezing, rhonchi or rales. Gastrointestinal. Abdomen with no organomegaly, non tender, no rebound or guarding Skin. No rashes Musculoskeletal: no joint deformities   The  results of significant diagnostics from this hospitalization (including imaging, microbiology, ancillary and laboratory) are listed below for reference.     Microbiology: Recent Results (from the past 240 hour(s))  Blood Culture (routine x 2)     Status: None (Preliminary result)   Collection Time: 11/12/18  5:35 AM   Specimen: BLOOD  Result Value Ref Range Status   Specimen Description BLOOD RIGHT HAND  Final   Special Requests   Final    BOTTLES DRAWN AEROBIC AND ANAEROBIC Blood Culture adequate volume   Culture   Final    NO GROWTH 3 DAYS Performed at [facility], [address], [place], [place] [address]    Report Status PENDING  Incomplete  Blood Culture (routine x 2)     Status: None  (Preliminary result)   Collection Time: 11/12/18  5:37 AM   Specimen: BLOOD  Result Value Ref Range Status   Specimen Description BLOOD RIGHT ARM  Final   Special Requests   Final    BOTTLES DRAWN AEROBIC AND ANAEROBIC Blood Culture adequate volume   Culture   Final    NO GROWTH 3 DAYS Performed at [facility], [place] [address] [place], [place] [address]    Report Status PENDING  Incomplete  [facility] order, Performed in [place] hospital lab)     Status: None   Collection Time: 11/12/18  6:05 AM   Specimen: Nasopharyngeal Swab  Result Value Ref Range Status   SARS Coronavirus 2 NEGATIVE NEGATIVE Final    Comment: (NOTE) If result is NEGATIVE SARS-CoV-2 target nucleic acids are NOT DETECTED. The SARS-CoV-2 RNA is generally detectable in upper and lower  respiratory specimens during the acute phase of infection. The lowest  concentration of SARS-CoV-2 viral copies this assay can detect is 250  copies / mL. A negative result does not preclude SARS-CoV-2 infection  and should not be used as the sole basis for treatment or other  patient management decisions.  A negative result may occur with  improper specimen collection / handling, submission of specimen other  than nasopharyngeal swab, presence of viral mutation(s) within the  areas targeted by this assay, and inadequate number of viral copies  (<250 copies / mL). A negative result must be combined with clinical  observations, patient history, and epidemiological information. If result is POSITIVE SARS-CoV-2 target nucleic acids are DETECTED. The SARS-CoV-2 RNA is generally detectable in upper and lower  respiratory specimens dur ing the acute phase of infection.  Positive  results are indicative of active infection with SARS-CoV-2.  Clinical  correlation with patient history and other diagnostic information is  necessary to determine patient infection status.  Positive results do  not rule out  bacterial infection or co-infection with other viruses. If result is PRESUMPTIVE POSTIVE SARS-CoV-2 nucleic acids MAY BE PRESENT.   A presumptive positive result was obtained on the submitted specimen  and confirmed on repeat testing.  While 2019 novel coronavirus  (SARS-CoV-2) nucleic acids may be present in the submitted sample  additional confirmatory testing may be necessary for epidemiological  and / or clinical management purposes  to differentiate between  SARS-CoV-2 and other Sarbecovirus currently known to infect humans.  If clinically indicated additional testing with an alternate test  methodology [phone]) is advised. The SARS-CoV-2 RNA is generally  detectable in upper and lower respiratory sp ecimens during the acute  phase of infection. The expected result is Negative. Fact Sheet for Patients:  StrictlyIdeas.no  Fact Sheet for Healthcare Providers: BankingDealers.co.za This test is not yet approved or cleared by the [place] FDA and has been authorized for detection and/or diagnosis of SARS-CoV-2 by FDA under an Emergency Use Authorization (EUA).  This EUA will remain in effect (meaning this test can be used) for the duration of the COVID-19 declaration under Section 564(b)(1) of the Act, 21 U.S.C. section 360bbb-3(b)(1), unless the authorization is terminated or revoked sooner. Performed at [facility], [place] [address], [place], [place] [address]      Labs: BNP (last 3 results) Recent Labs    06/05/18 1019 11/12/18 0548  BNP 995.0* 283.6*   Basic Metabolic Panel: Recent Labs  Lab 11/12/18 0547 11/13/18 0449 11/14/18 0337 11/15/18 0514 11/16/18 0533  NA 141 142 137 137 138  K 4.6 3.9 3.6 3.8 4.2  CL 111 107 104 105 105  CO2 18* 22 22 22  20*  GLUCOSE 131* 119* 97 98 106*  BUN 36* 38* 54* 71* 73*  CREATININE 1.66* 1.53* 1.95* 2.06* 2.01*  CALCIUM 9.5 9.6 8.9 8.9 9.2   Liver Function  Tests: Recent Labs  Lab 11/12/18 0547  AST 64*  ALT 66*  ALKPHOS 97  BILITOT 1.1  PROT 7.3  ALBUMIN 3.9   No results for input(s): LIPASE, AMYLASE in the last 168 hours. No results for input(s): AMMONIA in the last 168 hours. CBC: Recent Labs  Lab 11/12/18 0547 11/13/18 0449  WBC 13.3* 13.2*  NEUTROABS 10.8* 11.0*  HGB 10.7* 10.6*  HCT 33.5* 31.9*  MCV 94.4 91.7  PLT 137* 136*   Cardiac Enzymes: No results for input(s): CKTOTAL, CKMB, CKMBINDEX, TROPONINI in the last 168 hours. BNP: Invalid input(s): POCBNP CBG: Recent Labs  Lab 11/12/18 1626  GLUCAP 88   D-Dimer No results for input(s): DDIMER in the last 72 hours. Hgb A1c No results for input(s): HGBA1C in the last 72 hours. Lipid Profile No results for input(s): CHOL, HDL, LDLCALC, TRIG, CHOLHDL, LDLDIRECT in the last 72 hours. Thyroid function studies No results for input(s): TSH, T4TOTAL, T3FREE, THYROIDAB in the last 72 hours.  Invalid input(s): FREET3 Anemia work up No results for input(s): VITAMINB12, FOLATE, FERRITIN, TIBC, IRON, RETICCTPCT in the last 72 hours. Urinalysis    Component Value Date/Time   COLORURINE YELLOW 11/12/2018 1118   APPEARANCEUR CLEAR 11/12/2018 1118   LABSPEC 1.008 11/12/2018 1118   PHURINE 5.0 11/12/2018 1118   [place] 11/12/2018 1118   [place] 11/12/2018 1118   [place] 11/12/2018 1118   KETONESUR NEGATIVE 11/12/2018 1118   PROTEINUR 30 (A) 11/12/2018 1118   NITRITE NEGATIVE 11/12/2018 1118   LEUKOCYTESUR LARGE (A) 11/12/2018 1118   Sepsis Labs Invalid input(s): PROCALCITONIN,  WBC,  LACTICIDVEN Microbiology Recent Results (from the past 240 hour(s))  Blood Culture (routine x 2)     Status: None (Preliminary result)   Collection Time: 11/12/18  5:35 AM   Specimen: BLOOD  Result Value Ref Range Status   Specimen Description BLOOD RIGHT HAND  Final   Special Requests   Final    BOTTLES DRAWN AEROBIC AND ANAEROBIC Blood Culture  adequate volume   Culture   Final    NO GROWTH 3 DAYS Performed at [facility], [place] [address], [place], [place] [address]    Report Status PENDING  Incomplete  Blood Culture (routine x 2)     Status: None (Preliminary result)   Collection Time: 11/12/18  5:37 AM   Specimen: BLOOD  Result  Value Ref Range Status   Specimen Description BLOOD RIGHT ARM  Final   Special Requests   Final    BOTTLES DRAWN AEROBIC AND ANAEROBIC Blood Culture adequate volume   Culture   Final    NO GROWTH 3 DAYS Performed at [facility], [address], [place], [place] [address]    Report Status PENDING  Incomplete  SARS Coronavirus 2 [facility] order, Performed in [place] hospital lab)     Status: None   Collection Time: 11/12/18  6:05 AM   Specimen: Nasopharyngeal Swab  Result Value Ref Range Status   SARS Coronavirus 2 NEGATIVE NEGATIVE Final    Comment: (NOTE) If result is NEGATIVE SARS-CoV-2 target nucleic acids are NOT DETECTED. The SARS-CoV-2 RNA is generally detectable in upper and lower  respiratory specimens during the acute phase of infection. The lowest  concentration of SARS-CoV-2 viral copies this assay can detect is 250  copies / mL. A negative result does not preclude SARS-CoV-2 infection  and should not be used as the sole basis for treatment or other  patient management decisions.  A negative result may occur with  improper specimen collection / handling, submission of specimen other  than nasopharyngeal swab, presence of viral mutation(s) within the  areas targeted by this assay, and inadequate number of viral copies  (<250 copies / mL). A negative result must be combined with clinical  observations, patient history, and epidemiological information. If result is POSITIVE SARS-CoV-2 target nucleic acids are DETECTED. The SARS-CoV-2 RNA is generally detectable in upper and lower  respiratory specimens dur ing the acute phase of infection.  Positive  results are  indicative of active infection with SARS-CoV-2.  Clinical  correlation with patient history and other diagnostic information is  necessary to determine patient infection status.  Positive results do  not rule out bacterial infection or co-infection with other viruses. If result is PRESUMPTIVE POSTIVE SARS-CoV-2 nucleic acids MAY BE PRESENT.   A presumptive positive result was obtained on the submitted specimen  and confirmed on repeat testing.  While 2019 novel coronavirus  (SARS-CoV-2) nucleic acids may be present in the submitted sample  additional confirmatory testing may be necessary for epidemiological  and / or clinical management purposes  to differentiate between  SARS-CoV-2 and other Sarbecovirus currently known to infect humans.  If clinically indicated additional testing with an alternate test  methodology [phone]) is advised. The SARS-CoV-2 RNA is generally  detectable in upper and lower respiratory sp ecimens during the acute  phase of infection. The expected result is Negative. Fact Sheet for Patients:  StrictlyIdeas.no Fact Sheet for Healthcare Providers: BankingDealers.co.za This test is not yet approved or cleared by the [place] FDA and has been authorized for detection and/or diagnosis of SARS-CoV-2 by FDA under an Emergency Use Authorization (EUA).  This EUA will remain in effect (meaning this test can be used) for the duration of the COVID-19 declaration under Section 564(b)(1) of the Act, 21 U.S.C. section 360bbb-3(b)(1), unless the authorization is terminated or revoked sooner. Performed at [facility], [place] [address], [place], [place] [address]      Time coordinating discharge: 45 minutes  SIGNED:   Tawni Millers, MD  [facility] 11/16/2018, 10:02 AM

## 2018-11-16 NOTE — TOC Transition Note (Signed)
Transition of Care [facility]) - CM/SW Discharge Note   Patient Details  Name: Rhonda Sharp MRN: [id number] Date of Birth: 05/16/1928  [facility] CM/SW Contact:  Candie Chroman, LCSW Phone Number: 11/16/2018, 11:09 AM   Clinical Narrative: Patient has orders to discharge home today. Home health orders are in for PT and RN. [facility] representative is aware. No further concerns. CSW signing off.     Final next level of care: [place] Barriers to Discharge: Barriers Resolved   Patient Goals and CMS Choice Patient states their goals for this hospitalization and ongoing recovery are:: Patient not fully oriented. In regards to getting patient home, daughter said "the sooner the better."   Choice offered to / list presented to : Adult Children  Discharge Placement                    Patient and family notified of of transfer: 11/16/18  Discharge Plan and Services   Discharge Planning Services: CM Consult Post Acute Care Choice: Home Health          DME Arranged: N/A         HH Arranged: RN, PT [place] Agency: [place] Date [facility] Agency Contacted: 11/16/18   Representative spoke with at [place]: Adela Lank  Social Determinants of Health (SDOH) Interventions     Readmission Risk Interventions No flowsheet data found.

## 2018-11-16 NOTE — Progress Notes (Signed)
Occupational Therapy Treatment Patient Details Name: Rhonda Sharp MRN: [id number] DOB: 12-19-1928 Today's Date: 11/16/2018    History of present illness 85 yo admitted with SOB with presumed acute diastolic dysfunction in setting of hypertensive urgency . PMhx: HTN, CVa, CKD, glaucoma, gout, breast CA, ?dementia   OT comments  Pt progressing toward goals.  She is able to perform dressing with set up to min guard assist.  She is eager to discharge home, and reports her daughter will be able to provide needed level of supervision at discharge.   Follow Up Recommendations  Home health OT;Supervision/Assistance - 24 hour    Equipment Recommendations  None recommended by OT    Recommendations for Other Services      Precautions / Restrictions Precautions Precautions: Fall       Mobility Bed Mobility               General bed mobility comments: Pt sitting up in recliner   Transfers Overall transfer level: Needs assistance Equipment used: Rolling walker (2 wheeled) Transfers: Sit to/from Stand Sit to Stand: Supervision              Balance Overall balance assessment: Mild deficits observed, not formally tested                                         ADL either performed or assessed with clinical judgement   ADL Overall ADL's : Needs assistance/impaired     Grooming: Brushing hair;Sitting;Modified independent       Lower Body Bathing: Supervison/ safety;Sit to/from stand       Lower Body Dressing: Supervision/safety;Sit to/from stand               Functional mobility during ADLs: Supervision/safety;Min guard;Rolling walker       Vision       Perception     Praxis      Cognition Arousal/Alertness: Awake/alert Behavior During Therapy: WFL for tasks assessed/performed Overall Cognitive Status: Within Functional Limits for tasks assessed                                 General Comments: WFL for tasks  assessed         Exercises     Shoulder Instructions       General Comments Pt eager to discharge home, and reports her daughter will provide close supervision as she works from home     Pertinent Vitals/ Pain       Pain Assessment: No/denies pain  Home Living                                          Prior Functioning/Environment              Frequency  Min 2X/week        Progress Toward Goals  OT Goals(current goals can now be found in the care plan section)  Progress towards OT goals: Progressing toward goals     Plan Discharge plan remains appropriate    Co-evaluation                 AM-PAC OT "6 Clicks" Daily Activity     Outcome Measure   Help from another person  eating meals?: None Help from another person taking care of personal grooming?: A Little Help from another person toileting, which includes using toliet, bedpan, or urinal?: A Little Help from another person bathing (including washing, rinsing, drying)?: A Little Help from another person to put on and taking off regular upper body clothing?: None Help from another person to put on and taking off regular lower body clothing?: A Little 6 Click Score: 20    End of Session Equipment Utilized During Treatment: Rolling walker  OT Visit Diagnosis: Unsteadiness on feet (R26.81);Muscle weakness (generalized) (M62.81)   Activity Tolerance Patient tolerated treatment well   Patient Left in chair;with call bell/phone within reach   Nurse Communication Mobility status        Time: 1220-1252 OT Time Calculation (min): 32 min  Charges: OT General Charges $OT Visit: 1 Visit OT Treatments $Self Care/Home Management : 23-37 mins  Lucille Passy, OTR/L [place] Pager [phone] Office [phone]    Lucille Passy M 11/16/2018, 1:52 PM

## 2018-11-16 NOTE — Care Management Important Message (Signed)
Important Message  Patient Details  Name: Rhonda Sharp MRN: [id number] Date of Birth: 10/12/1928   Medicare Important Message Given:  Yes     Shelda Altes 11/16/2018, 12:58 PM

## 2018-11-17 LAB — CULTURE, BLOOD (ROUTINE X 2)
Culture: NO GROWTH
Culture: NO GROWTH
Special Requests: ADEQUATE
Special Requests: ADEQUATE

## 2018-11-21 DIAGNOSIS — I700 Atherosclerosis of aorta: Secondary | ICD-10-CM | POA: Diagnosis not present

## 2018-11-21 DIAGNOSIS — I130 Hypertensive heart and chronic kidney disease with heart failure and stage 1 through stage 4 chronic kidney disease, or unspecified chronic kidney disease: Secondary | ICD-10-CM | POA: Diagnosis not present

## 2018-11-21 DIAGNOSIS — Z7901 Long term (current) use of anticoagulants: Secondary | ICD-10-CM | POA: Diagnosis not present

## 2018-11-21 DIAGNOSIS — N183 Chronic kidney disease, stage 3 (moderate): Secondary | ICD-10-CM | POA: Diagnosis not present

## 2018-11-21 DIAGNOSIS — I5033 Acute on chronic diastolic (congestive) heart failure: Secondary | ICD-10-CM | POA: Diagnosis not present

## 2018-11-29 DIAGNOSIS — I130 Hypertensive heart and chronic kidney disease with heart failure and stage 1 through stage 4 chronic kidney disease, or unspecified chronic kidney disease: Secondary | ICD-10-CM | POA: Diagnosis not present

## 2018-12-05 ENCOUNTER — Telehealth: Payer: Self-pay

## 2018-12-05 ENCOUNTER — Telehealth: Payer: Self-pay | Admitting: Cardiology

## 2018-12-05 NOTE — Telephone Encounter (Signed)
New Message   STAT if HR is under 50 or over 120 (normal HR is 60-100 beats per minute)  1) What is your heart rate? 50  2) Do you have a log of your heart rate readings (document readings)? 50, 46  3) Do you have any other symptoms?   Patients BP 104/55 today. No SOB. Patient is scheduled to see Cecilie Kicks tomorrow as well. Daughter is very concerned about the HR because it has not been passed 32.

## 2018-12-05 NOTE — Telephone Encounter (Signed)
Pt gave verbal permission to speak to her dtr. Dtr reports mom's BP & HR have been low. Dr. Osborne Casco decreased her Bystolic to 5 mg daily a week ago. Reports HRs consistently 45-50.  Pt seems to feel fine.  Denies any dizziness/light headedness/syncope. Pt has been walking up a hill, in her neighborhood, with PT w/o issues.  She has been doing the dishes w/o issues.  Seems steady on her feet. Advised dtr that pt is ok to wait for further evaluation at appt tomorrow, beng that she is not symptomatic w/ low HRs. She also mentioned that when pt was in hospital they mentioned AFib but not sure what that meant.  Informed dtr that I didn't see anything mentioned in d/c summary but to discuss more at appt tomorrow. She also asked about Lasix increase at hospital earlier this month and concerned it was high dose.  Discussed normal dosing of 40 mg and rationale behind this.  Advised her to discuss further at tomorrows appt and determine if K+ needs to be added to medication regimen. Dtr reports pt had blood work at PCP yesterday. dtr agreeable to plan and appreciative of return call.  Will forward to Cecilie Kicks for her [place].

## 2018-12-06 ENCOUNTER — Other Ambulatory Visit: Payer: Self-pay

## 2018-12-06 ENCOUNTER — Ambulatory Visit (INDEPENDENT_AMBULATORY_CARE_PROVIDER_SITE_OTHER): Payer: Medicare Other | Admitting: Cardiology

## 2018-12-06 ENCOUNTER — Encounter: Payer: Self-pay | Admitting: Cardiology

## 2018-12-06 VITALS — BP 130/62 | HR 53 | Ht 63.0 in | Wt 137.0 lb

## 2018-12-06 DIAGNOSIS — I82402 Acute embolism and thrombosis of unspecified deep veins of left lower extremity: Secondary | ICD-10-CM | POA: Diagnosis not present

## 2018-12-06 DIAGNOSIS — N183 Chronic kidney disease, stage 3 unspecified: Secondary | ICD-10-CM

## 2018-12-06 DIAGNOSIS — I10 Essential (primary) hypertension: Secondary | ICD-10-CM | POA: Diagnosis not present

## 2018-12-06 DIAGNOSIS — I5021 Acute systolic (congestive) heart failure: Secondary | ICD-10-CM

## 2018-12-06 DIAGNOSIS — R001 Bradycardia, unspecified: Secondary | ICD-10-CM

## 2018-12-06 MED ORDER — FUROSEMIDE 20 MG PO TABS: 20.0000 mg | ORAL_TABLET | Freq: Every day | ORAL | 3 refills | Status: DC

## 2018-12-06 MED ORDER — NEBIVOLOL HCL 2.5 MG PO TABS: 2.5000 mg | ORAL_TABLET | Freq: Every day | ORAL | 3 refills | Status: DC

## 2018-12-06 NOTE — Progress Notes (Signed)
Cardiology Office Note   Date:  12/06/2018   ID:  Rhonda Sharp, DOB May 19, 1928, MRN [id number]  PCP:  Haywood Pao, MD  Cardiologist:  Dr. Marlou Porch     Chief Complaint  Patient presents with  . Hospitalization Follow-up      History of Present Illness: Rhonda Sharp is a 85 y.o. female who presents for post hospitalization   A history of chronic diastolic CHF, CVA, DVT on Eliquis, labile hypertension, hyperlipidemia, breast cancer s/p radiation, CKD stage III, and mild dementia admitted for hypoxia and SOB.  She was hypertensive.  BNP was 664 --HTN on bystolic, amlodipine and hydralazine.  At discharge her lasix at 40 mg daily.  Wt at discharge 68.5 kg 150.7 lbs  Now wt to 137 lbs - she and her daughter do not believe her wt was that high. Pt had called for bradycardia, PCP decreased her bystolic to 5 mg last week.  HR 45-50 - despite this her HR continues this low.   She has been week unsure if due to HR or over diuresing.  ?Cr on the 23rd Cr was 2.6 - elevated,  She has no edema and no SOB unless walks a long way.     Past Medical History:  Diagnosis Date  . Breast cancer, left breast ([place]) ~ 2002   "had radiation"  . Glaucoma   . History of gout   . Hypertension   . Migraine    "used to get them; nothing recently" (05/22/2018)  . Retinal hemorrhage   . Stroke [facility]) 2001   daughter denies residual on 05/22/2018    Past Surgical History:  Procedure Laterality Date  . APPENDECTOMY    . AXILLARY LYMPH NODE DISSECTION Left ~ 2002  . BREAST BIOPSY Left ~ 2002  . EYE SURGERY Left 05/2018   "laser OR for bleed"  . TUBAL LIGATION       Current Outpatient Medications  Medication Sig Dispense Refill  . acetaminophen (TYLENOL) 500 MG tablet Take 1,000 mg by mouth every 6 (six) hours as needed for mild pain.    Marland Kitchen amLODipine (NORVASC) 5 MG tablet Take 1 tablet (5 mg total) by mouth daily. 30 tablet 0  . apixaban (ELIQUIS) 5 MG TABS tablet Take 1 tablet (5 mg  total) by mouth 2 (two) times daily. 60 tablet 5  . fexofenadine (ALLEGRA) 180 MG tablet Take 180 mg by mouth as needed for allergies or rhinitis.    . furosemide (LASIX) 20 MG tablet Take 1 tablet (20 mg total) by mouth daily. 90 tablet 3  . hydrALAZINE (APRESOLINE) 50 MG tablet Take 1 tablet (50 mg total) by mouth 2 (two) times a day. 60 tablet 0  . pravastatin (PRAVACHOL) 20 MG tablet Take 1 tablet (20 mg total) by mouth daily at 6 PM. 30 tablet 0  . nebivolol (BYSTOLIC) 2.5 MG tablet Take 1 tablet (2.5 mg total) by mouth daily. 90 tablet 3   No current facility-administered medications for this visit.     Allergies:   Sulfa antibiotics    Social History:  The patient  reports that she has quit smoking. Her smoking use included cigarettes. She has a 20.00 pack-year smoking history. She has never used smokeless tobacco. She reports current alcohol use of about 6.0 standard drinks of alcohol per week. She reports that she does not use drugs.   Family History:  The patient'sfamily history includes ALS in her sister.    ROS:  General:no colds or fevers,  no weight changes per pt Skin:no rashes or ulcers HEENT:no blurred vision, no congestion CV:see HPI PUL:see HPI GI:no diarrhea constipation or melena, no indigestion GU:no hematuria, no dysuria MS:no joint pain, no claudication Neuro:no syncope, no lightheadedness, + weakness Endo:no diabetes, no thyroid disease  Wt Readings from Last 3 Encounters:  12/06/18 137 lb (62.1 kg)  11/16/18 138 lb 6.4 oz (62.8 kg)  06/14/18 150 lb 5.7 oz (68.2 kg)     PHYSICAL EXAM: VS:  BP 130/62   Pulse (!) 53   Ht 5\' 3"  (1.6 m)   Wt 137 lb (62.1 kg)   SpO2 97%   BMI 24.27 kg/m  , BMI Body mass index is 24.27 kg/m. General:Pleasant affect, NAD Skin:Warm and dry, brisk capillary refill HEENT:normocephalic, sclera clear, mucus membranes moist Neck:supple, no JVD, no bruits  Heart:S1S2 RRR without murmur, gallup, rub or click Lungs:clear  without rales, rhonchi, or wheezes VZC:HYIF, non tender, + BS, do not palpate liver spleen or masses Ext:no lower ext edema, 2+ pedal pulses, 2+ radial pulses Neuro:alert and oriented X 3, MAE, follows commands, + facial symmetry    EKG:  EKG is ordered today. The ekg ordered today demonstrates SB at 52 non specific ST abnormality no acute changes but slower.    Recent Labs: 05/22/2018: TSH 0.627 06/06/2018: Magnesium 1.9 11/12/2018: ALT 66; B Natriuretic Peptide 984.4 11/13/2018: Hemoglobin 10.6; Platelets 136 11/16/2018: BUN 73; Creatinine, Ser 2.01; Potassium 4.2; Sodium 138    Lipid Panel    Component Value Date/Time   CHOL 198 05/22/2018 0617   TRIG 74 11/12/2018 0547   HDL 63 05/22/2018 0617   CHOLHDL 3.1 05/22/2018 0617   VLDL 19 05/22/2018 0617   LDLCALC 116 (H) 05/22/2018 0617       Other studies Reviewed: Additional studies/ records that were reviewed today include: . Echo 11/12/18 IMPRESSIONS    1. The left ventricle has low normal systolic function, with an ejection fraction of 50-55% 50-55%. The cavity size was normal. There is mild concentric left ventricular hypertrophy. Left ventricular diastolic Doppler parameters are consistent with  pseudonormalization. No evidence of left ventricular regional wall motion abnormalities.  2. The right ventricle has normal systolic function. The cavity was normal. There is no increase in right ventricular wall thickness. Right ventricular systolic pressure is mildly elevated.  3. The aortic valve is tricuspid. No stenosis of the aortic valve.  4. The inferior vena cava was normal in size with <50% respiratory variability.  5. The interatrial septum was not assessed.  FINDINGS  Left Ventricle: The left ventricle has low normal systolic function, with an ejection fraction of 50-55% 50-55. The cavity size was normal. There is mild concentric left ventricular hypertrophy. Left ventricular diastolic Doppler parameters are   consistent with pseudonormalization. No evidence of left ventricular regional wall motion abnormalities..  Right Ventricle: The right ventricle has normal systolic function. The cavity was normal. There is no increase in right ventricular wall thickness. Right ventricular systolic pressure is mildly elevated.  Left Atrium: Left atrial size was normal in size.  Right Atrium: Right atrial size was normal in size.  Interatrial Septum: The interatrial septum was not assessed.  Pericardium: There is no evidence of pericardial effusion.  Mitral Valve: The mitral valve is normal in structure. Mitral valve regurgitation is trivial by color flow Doppler.  Tricuspid Valve: The tricuspid valve is normal in structure. Tricuspid valve regurgitation is mild by color flow Doppler.  Aortic Valve: The aortic valve is tricuspid Aortic valve  regurgitation was not visualized by color flow Doppler. There is No stenosis of the aortic valve.  Pulmonic Valve: The pulmonic valve was grossly normal. Pulmonic valve regurgitation is trivial by color flow Doppler. No evidence of pulmonic stenosis.  Pulmonary Artery: The pulmonary artery is not well seen.  Venous: The inferior vena cava is normal in size with less than 50% respiratory variability.   ASSESSMENT AND PLAN:  1.  Acute on chronic diastolic HF.  Diuresed to 1.5 lbs and wt has not been thought to be accurate.  At home wt stable 136 to 137.  Euvolemic. Will decrease lasix to 20 mg daily.  Last Cr was up to 2.6  Will recheck today. She is able to sleep flat. Check mg+ level. folow up in 7-10 days.   2.  Sinus brady on Bystolic, has been decreased from 10 to 5 but HR not improved will decrease to 2.5 mg daily.    3.  HTN on amlodipine and apresoline along with bystolic if BP increases would increase hydralazine.    4.  DVT Lt leg on eliquis and no bleeding.  5.  HLD on statin.    6.  CKD-3 monitor    Current medicines are reviewed with  the patient today.  The patient Has no concerns regarding medicines.  The following changes have been made:  See above Labs/ tests ordered today include:see above  Disposition:   FU:  see above  Signed, Cecilie Kicks, NP  12/06/2018 10:34 PM    [facility] [place], [place], [place], [place] Phone: [phone]; Fax: [phone]

## 2018-12-06 NOTE — Patient Instructions (Addendum)
.  Medication Instructions:    Start taking Bystolic 2.5 mg once a day   Start taking Lasix 20 mg once a day   If you need a refill on your cardiac medications before your next appointment, please call your pharmacy.   Lab work:  BMET AND MAG   If you have labs (blood work) drawn today and your tests are completely normal, you will receive your results only by: Marland Kitchen MyChart Message (if you have MyChart) OR . A paper copy in the mail If you have any lab test that is abnormal or we need to change your treatment, we will call you to review the results.  Testing/Procedures: NONE ORDERED  TODAY   Follow-Up: in 7 to 10 days with Cecilie Kicks    Any Other Special Instructions Will Be Listed Below (If Applicable).

## 2018-12-07 LAB — BASIC METABOLIC PANEL

## 2018-12-07 LAB — MAGNESIUM

## 2018-12-11 ENCOUNTER — Telehealth: Payer: Self-pay | Admitting: *Deleted

## 2018-12-11 DIAGNOSIS — Z79899 Other long term (current) drug therapy: Secondary | ICD-10-CM

## 2018-12-11 NOTE — Telephone Encounter (Signed)
Pt and daughter, [name], answered NO to covid 19 prescreen questions:      COVID-19 Pre-Screening Questions:  . In the past 7 to 10 days have you had a cough,  shortness of breath, headache, congestion, fever (100 or greater) body aches, chills, sore throat, or sudden loss of taste or sense of smell? . Have you been around anyone with known Covid 19. . Have you been around anyone who is awaiting Covid 19 test results in the past 7 to 10 days? . Have you been around anyone who has been exposed to Covid 19, or has mentioned symptoms of Covid 19 within the past 7 to 10 days?  If you have any concerns/questions about symptoms patients report during screening (either on the phone or at threshold). Contact the provider seeing the patient or DOD for further guidance.  If neither are available contact a member of the leadership team.

## 2018-12-11 NOTE — Telephone Encounter (Signed)
Called pt re: lab work done 7/31 and the need for repeat due to not enough specimen. Pt will come in 12/13/2018 for repeat.

## 2018-12-13 ENCOUNTER — Other Ambulatory Visit: Payer: Medicare Other | Admitting: *Deleted

## 2018-12-13 ENCOUNTER — Other Ambulatory Visit: Payer: Self-pay

## 2018-12-13 DIAGNOSIS — Z79899 Other long term (current) drug therapy: Secondary | ICD-10-CM | POA: Diagnosis not present

## 2018-12-14 ENCOUNTER — Telehealth: Payer: Self-pay | Admitting: *Deleted

## 2018-12-14 LAB — BASIC METABOLIC PANEL
BUN/Creatinine Ratio: 25 (ref 12–28)
BUN: 55 mg/dL — ABNORMAL HIGH (ref 8–27)
CO2: 22 mmol/L (ref 20–29)
Calcium: 9.6 mg/dL (ref 8.7–10.3)
Chloride: 106 mmol/L (ref 96–106)
Creatinine, Ser: 2.17 mg/dL — ABNORMAL HIGH (ref 0.57–1.00)
GFR calc Af Amer: 23 mL/min/{1.73_m2} — ABNORMAL LOW (ref >59)
GFR calc non Af Amer: 20 mL/min/{1.73_m2} — ABNORMAL LOW (ref >59)
Glucose: 101 mg/dL — ABNORMAL HIGH (ref 65–99)
Potassium: 4.1 mmol/L (ref 3.5–5.2)
Sodium: 145 mmol/L — ABNORMAL HIGH (ref 134–144)

## 2018-12-14 LAB — MAGNESIUM: Magnesium: 2 mg/dL (ref 1.6–2.3)

## 2018-12-14 NOTE — Telephone Encounter (Signed)
-----   Message from Isaiah Serge, NP sent at 12/14/2018  8:53 AM EDT ----- Labs with a little kidney stress, if no edema and no SOB then decrease lasix to MWF.

## 2018-12-14 NOTE — Telephone Encounter (Signed)
Call placed to pt re: lab results.  Voicemail full.  Will try later.

## 2018-12-17 NOTE — Progress Notes (Signed)
Cardiology Office Note   Date:  12/18/2018   ID:  Anndee Connett, DOB 09/24/1928, MRN [id number]  PCP:  Haywood Pao, MD  Cardiologist:  Dr. Marlou Porch    Chief Complaint  Patient presents with  . Bradycardia      History of Present Illness: Rhonda Sharp is a 85 y.o. female who presents for bradycardia and elevated Cr with recent HF.    A history of chronic diastolic CHF, CVA, DVT on Eliquis, labile hypertension, hyperlipidemia, breast cancer s/p radiation, CKD stage III, and mild dementia admitted for hypoxia and SOB.  She was hypertensive.  BNP was 409 --HTN on bystolic, amlodipine and hydralazine.  At discharge her lasix at 40 mg daily.  Wt at discharge 68.5 kg 150.7 lbs  Now wt to 137 lbs - she and her daughter do not believe her wt was that high. Pt had called for bradycardia, PCP decreased her bystolic to 5 mg last week.  HR 45-50 - despite this her HR continues this low.   She has been weak unsure if due to HR or over diuresing.  ?Cr on the 23rd Cr was 2.6 - elevated,  She has no edema and no SOB unless walks a long way.    I have decreased lasix due to euvolemic and increased Cr. + HTN may need hydralazine.  Check Lab  Last week Cr 2.17 improved Lasix. Was to be decreased to every other day but unable to reach pt.    Today she is feeling well, walked in with rolling walker and increased energy. No chest pain and no SOB.  No edema. Wt at home stable at 138 lbs. She is not eating much salt and is able to sleep flat in bed.  Her HR overall is improved but at times still in upper 40s but asymptomatic.  Today HR 56.  BP is stable usually 130s/60s      Past Medical History:  Diagnosis Date  . Breast cancer, left breast ([place]) ~ 2002   "had radiation"  . Glaucoma   . History of gout   . Hypertension   . Migraine    "used to get them; nothing recently" (05/22/2018)  . Retinal hemorrhage   . Stroke [facility]) 2001   daughter denies residual on 05/22/2018    Past  Surgical History:  Procedure Laterality Date  . APPENDECTOMY    . AXILLARY LYMPH NODE DISSECTION Left ~ 2002  . BREAST BIOPSY Left ~ 2002  . EYE SURGERY Left 05/2018   "laser OR for bleed"  . TUBAL LIGATION       Current Outpatient Medications  Medication Sig Dispense Refill  . acetaminophen (TYLENOL) 500 MG tablet Take 1,000 mg by mouth every 6 (six) hours as needed for mild pain.    Marland Kitchen amLODipine (NORVASC) 5 MG tablet Take 1 tablet (5 mg total) by mouth daily. 30 tablet 0  . apixaban (ELIQUIS) 5 MG TABS tablet Take 1 tablet (5 mg total) by mouth 2 (two) times daily. 60 tablet 5  . fexofenadine (ALLEGRA) 180 MG tablet Take 180 mg by mouth as needed for allergies or rhinitis.    . furosemide (LASIX) 20 MG tablet Take 1 tablet (20 mg total) by mouth daily. 90 tablet 3  . nebivolol (BYSTOLIC) 2.5 MG tablet Take 1 tablet (2.5 mg total) by mouth daily. 90 tablet 3  . pravastatin (PRAVACHOL) 20 MG tablet Take 1 tablet (20 mg total) by mouth daily at 6 PM. 30 tablet 0  .  hydrALAZINE (APRESOLINE) 50 MG tablet Take 1 tablet (50 mg total) by mouth 2 (two) times a day. 60 tablet 0   No current facility-administered medications for this visit.     Allergies:   Sulfa antibiotics    Social History:  The patient  reports that she has quit smoking. Her smoking use included cigarettes. She has a 20.00 pack-year smoking history. She has never used smokeless tobacco. She reports current alcohol use of about 6.0 standard drinks of alcohol per week. She reports that she does not use drugs.   Family History:  The patient's family history includes ALS in her sister.    ROS:  General:no colds or fevers, no weight changes Skin:no rashes or ulcers HEENT:no blurred vision, no congestion CV:see HPI PUL:see HPI GI:no diarrhea constipation or melena, no indigestion, no bleeding on eliquis  GU:no hematuria, no dysuria MS:no joint pain, no claudication Neuro:no syncope, no lightheadedness Endo:no diabetes,  no thyroid disease  Wt Readings from Last 3 Encounters:  12/18/18 140 lb (63.5 kg)  12/06/18 137 lb (62.1 kg)  11/16/18 138 lb 6.4 oz (62.8 kg)     PHYSICAL EXAM: VS:  BP (!) 132/58   Pulse (!) 56   Ht 5\' 3"  (1.6 m)   Wt 140 lb (63.5 kg)   SpO2 98%   BMI 24.80 kg/m  , BMI Body mass index is 24.8 kg/m. General:Pleasant affect, NAD Skin:Warm and dry, brisk capillary refill HEENT:normocephalic, sclera clear, mucus membranes moist Neck:supple, no JVD, no bruits  Heart:S1S2 RRR without murmur, gallup, rub or click Lungs:clear without rales, rhonchi, or wheezes SNK:NLZJ, non tender, + BS, do not palpate liver spleen or masses Ext:no lower ext edema, 2+ pedal pulses, 2+ radial pulses Neuro:alert and oriented X 3, MAE, follows commands, + facial symmetry    EKG:  EKG is NOT ordered today.    Recent Labs: 05/22/2018: TSH 0.627 11/12/2018: ALT 66; B Natriuretic Peptide 984.4 11/13/2018: Hemoglobin 10.6; Platelets 136 12/13/2018: BUN 55; Creatinine, Ser 2.17; Magnesium 2.0; Potassium 4.1; Sodium 145    Lipid Panel    Component Value Date/Time   CHOL 198 05/22/2018 0617   TRIG 74 11/12/2018 0547   HDL 63 05/22/2018 0617   CHOLHDL 3.1 05/22/2018 0617   VLDL 19 05/22/2018 0617   LDLCALC 116 (H) 05/22/2018 0617       Other studies Reviewed: Additional studies/ records that were reviewed today include: . Echo 11/12/18 IMPRESSIONS   1. The left ventricle has low normal systolic function, with an ejection fraction of 50-55% 50-55%. The cavity size was normal. There is mild concentric left ventricular hypertrophy. Left ventricular diastolic Doppler parameters are consistent with  pseudonormalization. No evidence of left ventricular regional wall motion abnormalities. 2. The right ventricle has normal systolic function. The cavity was normal. There is no increase in right ventricular wall thickness. Right ventricular systolic pressure is mildly elevated. 3. The aortic valve is  tricuspid. No stenosis of the aortic valve. 4. The inferior vena cava was normal in size with <50% respiratory variability. 5. The interatrial septum was not assessed.  FINDINGS Left Ventricle: The left ventricle has low normal systolic function, with an ejection fraction of 50-55% 50-55. The cavity size was normal. There is mild concentric left ventricular hypertrophy. Left ventricular diastolic Doppler parameters are  consistent with pseudonormalization. No evidence of left ventricular regional wall motion abnormalities..  Right Ventricle: The right ventricle has normal systolic function. The cavity was normal. There is no increase in right ventricular wall  thickness. Right ventricular systolic pressure is mildly elevated.  Left Atrium: Left atrial size was normal in size.  Right Atrium: Right atrial size was normal in size.  Interatrial Septum: The interatrial septum was not assessed.  Pericardium: There is no evidence of pericardial effusion.  Mitral Valve: The mitral valve is normal in structure. Mitral valve regurgitation is trivial by color flow Doppler.  Tricuspid Valve: The tricuspid valve is normal in structure. Tricuspid valve regurgitation is mild by color flow Doppler.  Aortic Valve: The aortic valve is tricuspid Aortic valve regurgitation was not visualized by color flow Doppler. There is No stenosis of the aortic valve.  Pulmonic Valve: The pulmonic valve was grossly normal. Pulmonic valve regurgitation is trivial by color flow Doppler. No evidence of pulmonic stenosis.  Pulmonary Artery: The pulmonary artery is not well seen.  Venous: The inferior vena cava is normal in size with less than 50% respiratory variability.   ASSESSMENT AND PLAN:  1.  Chronic diastolic HF with hospitalization in early July 2020.  Wt is stable , on last visit lasix decreased due to AKI, cr improved but still elevated will check today but decrease lasix to 20 mg every other  day.  Discussed if BP increased or wt increased to go back to 20 mg daily.  Will have her follow up in 1 month.  Pt's daughter needs to come to appts with her.     2.  Sinus brady with decrease of bystolic HR improved along with energy. HR still decreased at times but will continue bystolic unless HR stays low or symptomatic.   3.  HTN on amlodipine and hydralazine. If BP increases would increase hydralazine.  4.  DVT of Lt leg on eliquis and no bleeding. Continue.   5.  HLD on statin continue.   6.  CKD-4 to 3 chronic    Current medicines are reviewed with the patient today.  The patient Has no concerns regarding medicines.  The following changes have been made:  See above Labs/ tests ordered today include:see above  Disposition:   FU:  see above  Signed, Cecilie Kicks, NP  12/18/2018 11:10 AM    [facility], [place], [place], [place] Phone: [phone]; Fax: [phone]

## 2018-12-18 ENCOUNTER — Ambulatory Visit (INDEPENDENT_AMBULATORY_CARE_PROVIDER_SITE_OTHER): Payer: Medicare Other | Admitting: Cardiology

## 2018-12-18 ENCOUNTER — Other Ambulatory Visit: Payer: Self-pay

## 2018-12-18 ENCOUNTER — Encounter (INDEPENDENT_AMBULATORY_CARE_PROVIDER_SITE_OTHER): Payer: Self-pay

## 2018-12-18 ENCOUNTER — Encounter: Payer: Self-pay | Admitting: Cardiology

## 2018-12-18 VITALS — BP 132/58 | HR 56 | Ht 63.0 in | Wt 140.0 lb

## 2018-12-18 DIAGNOSIS — N183 Chronic kidney disease, stage 3 unspecified: Secondary | ICD-10-CM

## 2018-12-18 DIAGNOSIS — I82402 Acute embolism and thrombosis of unspecified deep veins of left lower extremity: Secondary | ICD-10-CM | POA: Diagnosis not present

## 2018-12-18 DIAGNOSIS — R001 Bradycardia, unspecified: Secondary | ICD-10-CM | POA: Diagnosis not present

## 2018-12-18 DIAGNOSIS — Z79899 Other long term (current) drug therapy: Secondary | ICD-10-CM

## 2018-12-18 DIAGNOSIS — I10 Essential (primary) hypertension: Secondary | ICD-10-CM | POA: Diagnosis not present

## 2018-12-18 DIAGNOSIS — I5032 Chronic diastolic (congestive) heart failure: Secondary | ICD-10-CM | POA: Diagnosis not present

## 2018-12-18 NOTE — Telephone Encounter (Signed)
Pt has appt today, 12/18/2018, and will get results at the o/v.

## 2018-12-18 NOTE — Patient Instructions (Addendum)
Medication Instructions:  Your physician has recommended you make the following change in your medication:  1.  REDUCE the Lasix to every other day  Call if your weight increase, 3 lbs in one night or 5 lbs in 1 week  If you need a refill on your cardiac medications before your next appointment, please call your pharmacy.   Lab work: TODAY:  BMET  If you have labs (blood work) drawn today and your tests are completely normal, you will receive your results only by: Marland Kitchen MyChart Message (if you have MyChart) OR . A paper copy in the mail If you have any lab test that is abnormal or we need to change your treatment, we will call you to review the results.  Testing/Procedures: None ordered  Follow-Up: At [facility], you and your health needs are our priority.  As part of our continuing mission to provide you with exceptional heart care, we have created designated Provider Care Teams.  These Care Teams include your primary Cardiologist (physician) and Advanced Practice Providers (APPs -  Physician Assistants and Nurse Practitioners) who all work together to provide you with the care you need, when you need it. You will need a follow up appointment in 1 months.   01/23/2019 at 11:30 to see Truitt Merle, NP  Any Other Special Instructions Will Be Listed Below (If Applicable) . Monitor your blood pressure. If you notice it is continuously over 144/85 Call us if your heart rate is running lower than 53 continuously

## 2018-12-19 ENCOUNTER — Telehealth: Payer: Self-pay | Admitting: *Deleted

## 2018-12-19 LAB — BASIC METABOLIC PANEL
BUN/Creatinine Ratio: 21 (ref 12–28)
BUN: 45 mg/dL — ABNORMAL HIGH (ref 8–27)
CO2: 22 mmol/L (ref 20–29)
Calcium: 9.8 mg/dL (ref 8.7–10.3)
Chloride: 103 mmol/L (ref 96–106)
Creatinine, Ser: 2.15 mg/dL — ABNORMAL HIGH (ref 0.57–1.00)
GFR calc Af Amer: 23 mL/min/{1.73_m2} — ABNORMAL LOW (ref >59)
GFR calc non Af Amer: 20 mL/min/{1.73_m2} — ABNORMAL LOW (ref >59)
Glucose: 114 mg/dL — ABNORMAL HIGH (ref 65–99)
Potassium: 4.5 mmol/L (ref 3.5–5.2)
Sodium: 143 mmol/L (ref 134–144)

## 2018-12-19 MED ORDER — FUROSEMIDE 20 MG PO TABS: ORAL_TABLET | ORAL | 3 refills | Status: DC

## 2018-12-19 NOTE — Telephone Encounter (Signed)
-----   Message from Isaiah Serge, NP sent at 12/19/2018  3:03 PM EDT ----- Let pt and daughter know her kidney function was slightly better, so lasix 20 mg daily, except hold on Fridays and Tuesdays.   Unless wt is up by 3 lbs in a day or 5 lbs in a week.  Her daughter said she took today due to wt gain.  Thanks.

## 2018-12-21 ENCOUNTER — Ambulatory Visit: Payer: Medicare Other | Admitting: Cardiology

## 2019-01-04 DIAGNOSIS — H348322 Tributary (branch) retinal vein occlusion, left eye, stable: Secondary | ICD-10-CM | POA: Diagnosis not present

## 2019-01-04 DIAGNOSIS — H35373 Puckering of macula, bilateral: Secondary | ICD-10-CM | POA: Diagnosis not present

## 2019-01-04 DIAGNOSIS — H353132 Nonexudative age-related macular degeneration, bilateral, intermediate dry stage: Secondary | ICD-10-CM | POA: Diagnosis not present

## 2019-01-04 DIAGNOSIS — H2513 Age-related nuclear cataract, bilateral: Secondary | ICD-10-CM | POA: Diagnosis not present

## 2019-01-18 NOTE — Progress Notes (Signed)
CARDIOLOGY OFFICE NOTE  Date:  01/23/2019    Rhonda Sharp Date of Birth: Apr 22, 1929 Medical Record #[id number]  PCP:  Haywood Pao, MD  Cardiologist:  Marlou Porch (seen in hospital)  Chief Complaint  Patient presents with  . Follow-up    History of Present Illness: Rhonda Sharp is a 85 y.o. female who presents today for a one month check. Seen for Dr. Marlou Porch.   She has a history of chronic diastolic CHF, CVA, DVT on Eliquis, labile hypertension, hyperlipidemia, breast cancer s/p radiation, CKD stage III, and mild dementia.   She has had several admissions this year - has had altered mental status, respiratory failure and lastly in July was hypoxic and having acute diastolic HF. Echo was obtained - EF 50 to 55%. There has been concern for bradycardia and beta blocker has been cut back. BP has not been controlled and she has required additional agents.   Last seen a month ago by Cecilie Kicks, NP - she was felt to be doing well. Still noted with some bradycardia with HR in the upper 40's but not symptomatic. Medicines were continued.     The patient does not have symptoms concerning for COVID-19 infection (fever, chills, cough, or new shortness of breath).   Comes in today. Here with her daughter Rhonda Sharp today. She lives with her daughter. Rhonda Sharp feels like everything is going ok but is concerned about weight gain Rhonda Sharp had been out of town and there was much dietary indiscretion)  - she did have [place] food this past weekend. The patient denies chest pain, says her breathing is ok, no swelling, not really dizzy - may be a bit dizzy when first gets up - then it passes. No syncope. They have not been taking the Lasix as previously recommended - using every day and has been for some time.   Past Medical History:  Diagnosis Date  . Breast cancer, left breast ([place]) ~ 2002   "had radiation"  . Glaucoma   . History of gout   . Hypertension   . Migraine    "used to get them; nothing recently" (05/22/2018)  . Retinal hemorrhage   . Stroke [facility]) 2001   daughter denies residual on 05/22/2018    Past Surgical History:  Procedure Laterality Date  . APPENDECTOMY    . AXILLARY LYMPH NODE DISSECTION Left ~ 2002  . BREAST BIOPSY Left ~ 2002  . EYE SURGERY Left 05/2018   "laser OR for bleed"  . TUBAL LIGATION       Medications: Current Meds  Medication Sig  . acetaminophen (TYLENOL) 500 MG tablet Take 1,000 mg by mouth every 6 (six) hours as needed for mild pain.  Marland Kitchen amLODipine (NORVASC) 5 MG tablet Take 1 tablet (5 mg total) by mouth daily.  Marland Kitchen apixaban (ELIQUIS) 5 MG TABS tablet Take 1 tablet (5 mg total) by mouth 2 (two) times daily.  . fexofenadine (ALLEGRA) 180 MG tablet Take 180 mg by mouth as needed for allergies or rhinitis.  . furosemide (LASIX) 20 MG tablet Take 1 tablet (20 mg total) by mouth daily.  . nebivolol (BYSTOLIC) 2.5 MG tablet Take 1 tablet (2.5 mg total) by mouth daily.  . pravastatin (PRAVACHOL) 20 MG tablet Take 1 tablet (20 mg total) by mouth daily at 6 PM.  . [DISCONTINUED] furosemide (LASIX) 20 MG tablet Take 1 tablet by mouth on Sunday Monday Wednesday Thursday Saturday     Allergies: Allergies  Allergen Reactions  .  Sulfa Antibiotics Other (See Comments)    Unknown reaction to pt    Social History: The patient  reports that she has quit smoking. Her smoking use included cigarettes. She has a 20.00 pack-year smoking history. She has never used smokeless tobacco. She reports current alcohol use of about 6.0 standard drinks of alcohol per week. She reports that she does not use drugs.   Family History: The patient's family history includes ALS in her sister.   Review of Systems: Please see the history of present illness.   All other systems are reviewed and negative.   Physical Exam: VS:  BP 130/64 (BP Location: Right Arm, Patient Position: Sitting, Cuff Size: Normal)   Pulse (!) 59   Wt 146 lb 12.8 oz  (66.6 kg)   SpO2 100% Comment: at rest  BMI 26.00 kg/m  .  BMI Body mass index is 26 kg/m.  Wt Readings from Last 3 Encounters:  01/23/19 146 lb 12.8 oz (66.6 kg)  12/18/18 140 lb (63.5 kg)  12/06/18 137 lb (62.1 kg)    General: Pleasant. Elderly Alert and in no acute distress. Her weight is up 9 pounds since July.   HEENT: Normal.  Neck: Supple, no JVD, carotid bruits, or masses noted.  Cardiac: Regular rate and rhythm. No murmurs, rubs, or gallops. Trace edema.  Respiratory:  Lungs are fairly clear to auscultation bilaterally with normal work of breathing.  GI: Soft and nontender.  MS: No deformity or atrophy. Gait and ROM intact. She is using a cane.   Skin: Warm and dry. Color is normal.  Neuro:  Strength and sensation are intact and no gross focal deficits noted.  Psych: Alert, appropriate and with normal affect.   LABORATORY DATA:  EKG:  EKG is not ordered today.  Lab Results  Component Value Date   WBC 13.2 (H) 11/13/2018   HGB 10.6 (L) 11/13/2018   HCT 31.9 (L) 11/13/2018   PLT 136 (L) 11/13/2018   GLUCOSE 114 (H) 12/18/2018   CHOL 198 05/22/2018   TRIG 74 11/12/2018   HDL 63 05/22/2018   LDLCALC 116 (H) 05/22/2018   ALT 66 (H) 11/12/2018   AST 64 (H) 11/12/2018   NA 143 12/18/2018   K 4.5 12/18/2018   CL 103 12/18/2018   CREATININE 2.15 (H) 12/18/2018   BUN 45 (H) 12/18/2018   CO2 22 12/18/2018   TSH 0.627 05/22/2018   INR 1.20 06/05/2018   HGBA1C 6.0 (H) 05/22/2018     BNP (last 3 results) Recent Labs    06/05/18 1019 11/12/18 0548  BNP 995.0* 984.4*    ProBNP (last 3 results) No results for input(s): PROBNP in the last 8760 hours.   Other Studies Reviewed Today:  Echo 11/12/18 IMPRESSIONS  1. The left ventricle has low normal systolic function, with an ejection fraction of 50-55% 50-55%. The cavity size was normal. There is mild concentric left ventricular hypertrophy. Left ventricular diastolic Doppler parameters are consistent with   pseudonormalization. No evidence of left ventricular regional wall motion abnormalities. 2. The right ventricle has normal systolic function. The cavity was normal. There is no increase in right ventricular wall thickness. Right ventricular systolic pressure is mildly elevated. 3. The aortic valve is tricuspid. No stenosis of the aortic valve. 4. The inferior vena cava was normal in size with <50% respiratory variability. 5. The interatrial septum was not assessed.   ASSESSMENT AND PLAN:  1.  Chronic diastolic HF - her weight is up - probably has  had excessive salt based on what she ate this past weekend - ok to take extra dose of her lasix today and tomorrow - then will stay with 20 mg every day.   2. Bradycardia - has had her Bystolic previously cut back - has been asymptomatic. HR is 59 today - she is not symptomatic - no changes made today.   3. HTN - BP is fine. No changes made today. I refilled the Norvasc today for #90.   4. HLD - on Pravachol.   5. DVT - on chronic anticoagulation with Eliquis - she is on the full dose - discussed with pharmacy here - looks like this was for chronic DVT - no AF - Jinny Blossom has advised that this current dose be continued.   6. CKD - rechecking lab today.   7. Mild anemia - rechecking lab today   8. Prior elevated LFTs - will recheck today.   9. COVID-19 Education: The Sharp and symptoms of COVID-19 were discussed with the patient and how to seek care for testing (follow up with PCP or arrange E-visit).  The importance of social distancing, staying at home, hand hygiene and wearing a mask when out in public were discussed today.  Current medicines are reviewed with the patient today.  The patient does not have concerns regarding medicines other than what has been noted above.  The following changes have been made:  See above.  Labs/ tests ordered today include:    Orders Placed This Encounter  Procedures  . Basic Metabolic Panel (BMET)   . CBC  . Hepatic function panel     Disposition:   FU with Dr. Marlou Porch in 3 to 4 months.  Patient is agreeable to this plan and will call if any problems develop in the interim.   SignedTruitt Merle, NP  01/23/2019 12:22 PM  [place] [address] [place], [place]  [address] Phone: [phone] Fax: [phone]

## 2019-01-23 ENCOUNTER — Other Ambulatory Visit: Payer: Self-pay

## 2019-01-23 ENCOUNTER — Encounter: Payer: Self-pay | Admitting: Nurse Practitioner

## 2019-01-23 ENCOUNTER — Ambulatory Visit (INDEPENDENT_AMBULATORY_CARE_PROVIDER_SITE_OTHER): Payer: Medicare Other | Admitting: Nurse Practitioner

## 2019-01-23 ENCOUNTER — Telehealth: Payer: Self-pay | Admitting: *Deleted

## 2019-01-23 VITALS — BP 130/64 | HR 59 | Wt 146.8 lb

## 2019-01-23 DIAGNOSIS — Z79899 Other long term (current) drug therapy: Secondary | ICD-10-CM | POA: Diagnosis not present

## 2019-01-23 DIAGNOSIS — I5021 Acute systolic (congestive) heart failure: Secondary | ICD-10-CM

## 2019-01-23 MED ORDER — AMLODIPINE BESYLATE 5 MG PO TABS: 5.0000 mg | ORAL_TABLET | Freq: Every day | ORAL | 3 refills | Status: DC

## 2019-01-23 MED ORDER — FUROSEMIDE 20 MG PO TABS: 20.0000 mg | ORAL_TABLET | Freq: Every day | ORAL | 3 refills | Status: DC

## 2019-01-23 NOTE — Patient Instructions (Addendum)
After Visit Summary:  Your physician recommends that you have lab work in: BMET, CBC, LFT    Medication Instructions:    Continue with your current medicines.   Ok to have the Lasix every day - would advise an extra 20 mg today and tomorrow to help get weight down.    If you need a refill on your cardiac medications before your next appointment, please call your pharmacy.     Testing/Procedures To Be Arranged:  N/A  Follow-Up:   See Dr. Marlou Porch in 3 to 4 months.     At [facility], you and your health needs are our priority.  As part of our continuing mission to provide you with exceptional heart care, we have created designated Provider Care Teams.  These Care Teams include your primary Cardiologist (physician) and Advanced Practice Providers (APPs -  Physician Assistants and Nurse Practitioners) who all work together to provide you with the care you need, when you need it.  Special Instructions:  . Stay safe, stay home, wash your hands for at least 20 seconds and wear a mask when out in public.  . It was good to talk with you today.    Call the [place] office at [phone] if you have any questions, problems or concerns.

## 2019-01-23 NOTE — Telephone Encounter (Signed)
S/w pt's daughter per [facility]) is aware to stay on one tablet (5mg ) bid of Eliquis per [employer], Pharm-D.

## 2019-01-24 ENCOUNTER — Telehealth: Payer: Self-pay | Admitting: *Deleted

## 2019-01-24 LAB — BASIC METABOLIC PANEL
BUN/Creatinine Ratio: 24 (ref 12–28)
BUN: 48 mg/dL — ABNORMAL HIGH (ref 8–27)
CO2: 22 mmol/L (ref 20–29)
Calcium: 9.3 mg/dL (ref 8.7–10.3)
Chloride: 105 mmol/L (ref 96–106)
Creatinine, Ser: 2.04 mg/dL — ABNORMAL HIGH (ref 0.57–1.00)
GFR calc Af Amer: 25 mL/min/{1.73_m2} — ABNORMAL LOW (ref >59)
GFR calc non Af Amer: 21 mL/min/{1.73_m2} — ABNORMAL LOW (ref >59)
Glucose: 108 mg/dL — ABNORMAL HIGH (ref 65–99)
Potassium: 4.2 mmol/L (ref 3.5–5.2)
Sodium: 144 mmol/L (ref 134–144)

## 2019-01-24 LAB — CBC
Hematocrit: 31.3 % — ABNORMAL LOW (ref 34.0–46.6)
Hemoglobin: 10.5 g/dL — ABNORMAL LOW (ref 11.1–15.9)
MCH: 30.5 pg (ref 26.6–33.0)
MCHC: 33.5 g/dL (ref 31.5–35.7)
MCV: 91 fL (ref 79–97)
Platelets: 158 10*3/uL (ref 150–450)
RBC: 3.44 x10E6/uL — ABNORMAL LOW (ref 3.77–5.28)
RDW: 14 % (ref 11.7–15.4)
WBC: 9.3 10*3/uL (ref 3.4–10.8)

## 2019-01-24 LAB — HEPATIC FUNCTION PANEL
ALT: 10 IU/L (ref 0–32)
AST: 20 IU/L (ref 0–40)
Albumin: 4.1 g/dL (ref 3.6–4.6)
Alkaline Phosphatase: 65 IU/L (ref 39–117)
Bilirubin Total: 0.2 mg/dL (ref 0.0–1.2)
Bilirubin, Direct: 0.07 mg/dL (ref 0.00–0.40)
Total Protein: 7 g/dL (ref 6.0–8.5)

## 2019-01-24 NOTE — Telephone Encounter (Signed)
I s/w pt's daughter [name] who has been notified of pt's lab results by phone with verbal understanding. [name] did want to let Truitt Merle, NP know as well that she did give pt the increased dose of lasix yesterday , though she did not today as she states pt 's tells her that her BP was low today. [name] tried to find the paper the pt wrote the BP reading on though was unable to find it. She did state she felt the BP was 90/60 and that the pt has felt fatigued and washed out today, though NOT dizzy per [name]. I did advise to make sure the pt is staying hydrated especially being on a diuretic fluid is going out though we need to replenish with water and limit caffeine. [name] said she will give her mother the increased dose of lasix tomorrow and then resume her regular dose of lasix 20 mg daily as outlined in visit with Truitt Merle, NP Juventino Slovak me for the call. Advised we will call if Truitt Merle, NP has another recommendations. I will release results to MY CHART. Patient notified of result.  Please refer to phone note from today for complete details.   Julaine Hua, [facility] 01/24/2019 5:26 PM

## 2019-01-24 NOTE — Telephone Encounter (Signed)
Noted.   Rhonda Sharp 

## 2019-01-24 NOTE — Telephone Encounter (Signed)
-----   Message from Burtis Junes, NP sent at 01/24/2019  3:19 PM EDT ----- Ok to report. Would report to her daughter.  Her labs are stable.  Continue on current regimen as outlined at her visit.

## 2019-02-11 DIAGNOSIS — M25462 Effusion, left knee: Secondary | ICD-10-CM | POA: Diagnosis not present

## 2019-02-11 DIAGNOSIS — M25562 Pain in left knee: Secondary | ICD-10-CM | POA: Diagnosis not present

## 2019-02-21 DIAGNOSIS — N183 Chronic kidney disease, stage 3 unspecified: Secondary | ICD-10-CM | POA: Diagnosis not present

## 2019-02-21 DIAGNOSIS — I679 Cerebrovascular disease, unspecified: Secondary | ICD-10-CM | POA: Diagnosis not present

## 2019-02-21 DIAGNOSIS — Z7901 Long term (current) use of anticoagulants: Secondary | ICD-10-CM | POA: Diagnosis not present

## 2019-02-21 DIAGNOSIS — I130 Hypertensive heart and chronic kidney disease with heart failure and stage 1 through stage 4 chronic kidney disease, or unspecified chronic kidney disease: Secondary | ICD-10-CM | POA: Diagnosis not present

## 2019-02-21 DIAGNOSIS — J9621 Acute and chronic respiratory failure with hypoxia: Secondary | ICD-10-CM | POA: Diagnosis not present

## 2019-02-21 DIAGNOSIS — I700 Atherosclerosis of aorta: Secondary | ICD-10-CM | POA: Diagnosis not present

## 2019-02-21 DIAGNOSIS — I82532 Chronic embolism and thrombosis of left popliteal vein: Secondary | ICD-10-CM | POA: Diagnosis not present

## 2019-02-21 DIAGNOSIS — M25562 Pain in left knee: Secondary | ICD-10-CM | POA: Diagnosis not present

## 2019-02-21 DIAGNOSIS — H9190 Unspecified hearing loss, unspecified ear: Secondary | ICD-10-CM | POA: Diagnosis not present

## 2019-02-21 DIAGNOSIS — Z853 Personal history of malignant neoplasm of breast: Secondary | ICD-10-CM | POA: Diagnosis not present

## 2019-02-25 DIAGNOSIS — I130 Hypertensive heart and chronic kidney disease with heart failure and stage 1 through stage 4 chronic kidney disease, or unspecified chronic kidney disease: Secondary | ICD-10-CM | POA: Diagnosis not present

## 2019-02-25 DIAGNOSIS — Z23 Encounter for immunization: Secondary | ICD-10-CM | POA: Diagnosis not present

## 2019-02-25 DIAGNOSIS — I5033 Acute on chronic diastolic (congestive) heart failure: Secondary | ICD-10-CM | POA: Diagnosis not present

## 2019-04-12 ENCOUNTER — Other Ambulatory Visit: Payer: Self-pay

## 2019-04-12 ENCOUNTER — Ambulatory Visit (INDEPENDENT_AMBULATORY_CARE_PROVIDER_SITE_OTHER): Payer: Medicare Other | Admitting: Cardiology

## 2019-04-12 ENCOUNTER — Encounter: Payer: Self-pay | Admitting: Cardiology

## 2019-04-12 VITALS — BP 136/62 | HR 66 | Ht 63.0 in | Wt 140.0 lb

## 2019-04-12 DIAGNOSIS — I10 Essential (primary) hypertension: Secondary | ICD-10-CM

## 2019-04-12 DIAGNOSIS — I5021 Acute systolic (congestive) heart failure: Secondary | ICD-10-CM

## 2019-04-12 DIAGNOSIS — N183 Chronic kidney disease, stage 3 unspecified: Secondary | ICD-10-CM

## 2019-04-12 DIAGNOSIS — I82402 Acute embolism and thrombosis of unspecified deep veins of left lower extremity: Secondary | ICD-10-CM

## 2019-04-12 MED ORDER — ASPIRIN EC 81 MG PO TBEC: 81.0000 mg | DELAYED_RELEASE_TABLET | Freq: Every day | ORAL | 3 refills | Status: AC

## 2019-04-12 NOTE — Progress Notes (Signed)
Cardiology Office Note:    Date:  04/12/2019   ID:  Rhonda Sharp, DOB 1928/08/26, MRN [id number]  PCP:  Haywood Pao, MD  Cardiologist:  Candee Furbish, MD  Electrophysiologist:  None   Referring MD: Haywood Pao, MD     History of Present Illness:    Rhonda Sharp is a 85 y.o. female with diastolic heart failure prior stroke DVT on Eliquis labile hypertension hyperlipidemia breast cancer status post radiation CKD 3 and mild dementia here for follow-up.  Last seen by Truitt Merle.  I saw her previously in the hospital setting.  She has had several admissions for altered mental status respiratory failure previously in July with diastolic heart failure.  Echo 50 to 55% EF.  Beta-blocker was cut back because of bradycardia.  BP difficult to control.  Rhonda Sharp is her daughter.  Weight gain, occasional [place] food.  Has had some dizziness.  Mostly when she first gets up, no syncope.    Overall she has been doing quite well stable.  They asked the question of if they could potentially come off of the Eliquis.  Please see below for details.    Past Medical History:  Diagnosis Date  . Breast cancer, left breast ([place]) ~ 2002   "had radiation"  . Glaucoma   . History of gout   . Hypertension   . Migraine    "used to get them; nothing recently" (05/22/2018)  . Retinal hemorrhage   . Stroke [facility]) 2001   daughter denies residual on 05/22/2018    Past Surgical History:  Procedure Laterality Date  . APPENDECTOMY    . AXILLARY LYMPH NODE DISSECTION Left ~ 2002  . BREAST BIOPSY Left ~ 2002  . EYE SURGERY Left 05/2018   "laser OR for bleed"  . TUBAL LIGATION      Current Medications: Current Meds  Medication Sig  . acetaminophen (TYLENOL) 500 MG tablet Take 1,000 mg by mouth every 6 (six) hours as needed for mild pain.  Marland Kitchen amLODipine (NORVASC) 5 MG tablet Take 1 tablet (5 mg total) by mouth daily.  . fexofenadine (ALLEGRA) 180 MG tablet Take 180 mg by  mouth as needed for allergies or rhinitis.  . furosemide (LASIX) 20 MG tablet Take 1 tablet (20 mg total) by mouth daily.  . hydrALAZINE (APRESOLINE) 50 MG tablet Take 1 tablet (50 mg total) by mouth 2 (two) times a day.  . nebivolol (BYSTOLIC) 2.5 MG tablet Take 1 tablet (2.5 mg total) by mouth daily.  . pravastatin (PRAVACHOL) 20 MG tablet Take 1 tablet (20 mg total) by mouth daily at 6 PM.  . [DISCONTINUED] apixaban (ELIQUIS) 5 MG TABS tablet Take 1 tablet (5 mg total) by mouth 2 (two) times daily.     Allergies:   Sulfa antibiotics   Social History   Socioeconomic History  . Marital status: Widowed    Spouse name: Not on file  . Number of children: Not on file  . Years of education: Not on file  . Highest education level: Not on file  Occupational History  . Not on file  Social Needs  . Financial resource strain: Not on file  . Food insecurity    Worry: Not on file    Inability: Not on file  . Transportation needs    Medical: Not on file    Non-medical: Not on file  Tobacco Use  . Smoking status: Former Smoker    Packs/day: 1.00    Years: 20.00  Pack years: 20.00    Types: Cigarettes  . Smokeless tobacco: Never Used  . Tobacco comment: 05/22/2018 "quit smoking in the 1990s"  Substance and Sexual Activity  . Alcohol use: Yes    Alcohol/week: 6.0 standard drinks    Types: 6 Shots of liquor per week    Comment: 05/22/2018 "straight vodka"  . Drug use: Never  . Sexual activity: Not on file  Lifestyle  . Physical activity    Days per week: Not on file    Minutes per session: Not on file  . Stress: Not on file  Relationships  . Social Herbalist on phone: Not on file    Gets together: Not on file    Attends religious service: Not on file    Active member of club or organization: Not on file    Attends meetings of clubs or organizations: Not on file    Relationship status: Not on file  Other Topics Concern  . Not on file  Social History Narrative  .  Not on file     Family History: The patient's family history includes ALS in her sister.  ROS:   Please see the history of present illness.    No fevers chills nausea vomiting syncope bleeding all other systems reviewed and are negative.  EKGs/Labs/Other Studies Reviewed:    The following studies were reviewed today:  Echo 11/12/18 IMPRESSIONS  1. The left ventricle has low normal systolic function, with an ejection fraction of 50-55% 50-55%. The cavity size was normal. There is mild concentric left ventricular hypertrophy. Left ventricular diastolic Doppler parameters are consistent with  pseudonormalization. No evidence of left ventricular regional wall motion abnormalities. 2. The right ventricle has normal systolic function. The cavity was normal. There is no increase in right ventricular wall thickness. Right ventricular systolic pressure is mildly elevated. 3. The aortic valve is tricuspid. No stenosis of the aortic valve. 4. The inferior vena cava was normal in size with <50% respiratory variability. 5. The interatrial septum was not assessed.   EKG:  EKG is not ordered today.    Recent Labs: 05/22/2018: TSH 0.627 11/12/2018: B Natriuretic Peptide 984.4 12/13/2018: Magnesium 2.0 01/23/2019: ALT 10; BUN 48; Creatinine, Ser 2.04; Hemoglobin 10.5; Platelets 158; Potassium 4.2; Sodium 144  Recent Lipid Panel    Component Value Date/Time   CHOL 198 05/22/2018 0617   TRIG 74 11/12/2018 0547   HDL 63 05/22/2018 0617   CHOLHDL 3.1 05/22/2018 0617   VLDL 19 05/22/2018 0617   LDLCALC 116 (H) 05/22/2018 0617    Physical Exam:    VS:  BP 136/62   Pulse 66   Ht 5\' 3"  (1.6 m)   Wt 140 lb (63.5 kg)   SpO2 97%   BMI 24.80 kg/m     Wt Readings from Last 3 Encounters:  04/12/19 140 lb (63.5 kg)  01/23/19 146 lb 12.8 oz (66.6 kg)  12/18/18 140 lb (63.5 kg)     GEN: Elderly, hard of hearing well nourished, well developed in no acute distress HEENT: Normal NECK: No JVD;  No carotid bruits LYMPHATICS: No lymphadenopathy CARDIAC: RRR, no murmurs, rubs, gallops RESPIRATORY:  Clear to auscultation without rales, wheezing or rhonchi  ABDOMEN: Soft, non-tender, non-distended MUSCULOSKELETAL:  No edema; No deformity  SKIN: Warm and dry NEUROLOGIC:  Alert and oriented x 3 PSYCHIATRIC:  Normal affect   ASSESSMENT:    1. CHF (congestive heart failure), NYHA class I, acute, systolic ([place])   2.  Essential hypertension   3. Deep vein thrombosis (DVT) of left lower extremity, unspecified chronicity, unspecified vein (HCC)   4. Stage 3 chronic kidney disease, unspecified whether stage 3a or 3b CKD    PLAN:    In order of problems listed above:  Chronic diastolic heart failure -Continue to monitor weight.  Lasix 20 mg daily.  Can take an extra dose if weight increases.  Rhonda Sharp, her daughter helps out significantly.  Bradycardia -Bystolic previously has been cut back.  Asymptomatic.  Essential hypertension -Overall blood pressure currently has been under good control.  Also on Norvasc.  Blood pressure has been low 90/60.  Fatigued washed out.  Lasix occasionally held in the situations.  Hyperlipidemia -Continue with Pravachol statin.  DVT chronic -Eliquis, anticoagulation chronic DVT.  Has not had any evidence of atrial fibrillation.  Left popliteal chronic appearing DVT on 06/05/2018.  I consulted via telephone with one of our vascular surgeons.  If we repeated the vascular ultrasound, this "chronic appearing thrombosis" change on Doppler will likely be consistent upon repeat.  Treatment at this time will be transition from Eliquis over to aspirin 81 mg and remain vigilant with her left lower extremity, if lower extremity edema or pain were to develop more significantly in that leg, she knows to seek medical attention.  CKD 4 -No changes.  Creatinine 2.1  Mild anemia -No changes  Prior elevated LFTs -No changes  18-month follow-up, 1 year with me   Medication Adjustments/Labs and Tests Ordered: Current medicines are reviewed at length with the patient today.  Concerns regarding medicines are outlined above.  No orders of the defined types were placed in this encounter.  Meds ordered this encounter  Medications  . aspirin EC 81 MG tablet    Sig: Take 1 tablet (81 mg total) by mouth daily.    Dispense:  90 tablet    Refill:  3    Patient Instructions  Medication Instructions:   STOP TAKING ELIQUIS NOW  START TAKING ASPIRIN 81 MG BY MOUTH DAILY    Follow-Up:  Your physician wants you to follow-up in: [place] NP IN PERSON You will receive a reminder letter in the mail two months in advance. If you don't receive a letter, please call our office to schedule the follow-up appointment.   At [facility], you and your health needs are our priority.  As part of our continuing mission to provide you with exceptional heart care, we have created designated Provider Care Teams.  These Care Teams include your primary Cardiologist (physician) and Advanced Practice Providers (APPs -  Physician Assistants and Nurse Practitioners) who all work together to provide you with the care you need, when you need it.  Your next appointment:   12 month(s)  The format for your next appointment:   In Person  Provider:   Candee Furbish, MD         Signed, Candee Furbish, MD  04/12/2019 10:18 AM    [place]

## 2019-04-12 NOTE — Patient Instructions (Addendum)
Medication Instructions:   STOP TAKING ELIQUIS NOW  START TAKING ASPIRIN 81 MG BY MOUTH DAILY    Follow-Up:  Your physician wants you to follow-up in: [place] NP IN PERSON You will receive a reminder letter in the mail two months in advance. If you don't receive a letter, please call our office to schedule the follow-up appointment.   At [facility], you and your health needs are our priority.  As part of our continuing mission to provide you with exceptional heart care, we have created designated Provider Care Teams.  These Care Teams include your primary Cardiologist (physician) and Advanced Practice Providers (APPs -  Physician Assistants and Nurse Practitioners) who all work together to provide you with the care you need, when you need it.  Your next appointment:   12 month(s)  The format for your next appointment:   In Person  Provider:   Candee Furbish, MD

## 2019-05-17 DIAGNOSIS — H353132 Nonexudative age-related macular degeneration, bilateral, intermediate dry stage: Secondary | ICD-10-CM | POA: Diagnosis not present

## 2019-05-17 DIAGNOSIS — H43811 Vitreous degeneration, right eye: Secondary | ICD-10-CM | POA: Diagnosis not present

## 2019-05-17 DIAGNOSIS — H348322 Tributary (branch) retinal vein occlusion, left eye, stable: Secondary | ICD-10-CM | POA: Diagnosis not present

## 2019-05-17 DIAGNOSIS — H35373 Puckering of macula, bilateral: Secondary | ICD-10-CM | POA: Diagnosis not present

## 2019-05-19 ENCOUNTER — Ambulatory Visit: Payer: Medicare Other | Attending: Internal Medicine

## 2019-05-19 DIAGNOSIS — Z23 Encounter for immunization: Secondary | ICD-10-CM | POA: Diagnosis not present

## 2019-05-19 NOTE — Progress Notes (Signed)
   Covid-19 Vaccination Clinic  Name:  Rhonda Sharp    MRN: [id number] DOB: 03-Jun-1928  05/19/2019  Rhonda Sharp was observed post Covid-19 immunization for 30 minutes based on pre-vaccination screening without incidence. She was provided with Vaccine Information Sheet and instruction to access the V-Safe system.   Rhonda Sharp was instructed to call 911 with any severe reactions post vaccine: Marland Kitchen Difficulty breathing  . Swelling of your face and throat  . A fast heartbeat  . A bad rash all over your body  . Dizziness and weakness    Immunizations Administered    Name Date Dose VIS Date Route   Pfizer COVID-19 Vaccine 05/19/2019 12:46 PM 0.3 mL 04/19/2019 Intramuscular   Manufacturer: [place]   Lot: [id number]   [place]: [id number]

## 2019-06-04 DIAGNOSIS — H524 Presbyopia: Secondary | ICD-10-CM | POA: Diagnosis not present

## 2019-06-04 DIAGNOSIS — H2513 Age-related nuclear cataract, bilateral: Secondary | ICD-10-CM | POA: Diagnosis not present

## 2019-06-08 ENCOUNTER — Ambulatory Visit: Payer: Medicare Other | Attending: Internal Medicine

## 2019-06-08 DIAGNOSIS — Z23 Encounter for immunization: Secondary | ICD-10-CM

## 2019-06-08 NOTE — Progress Notes (Signed)
   Covid-19 Vaccination Clinic  Name:  Rhonda Sharp    MRN: [id number] DOB: [birth year]  06/08/2019  [name] was observed post Covid-19 immunization for 30 minutes based on pre-vaccination screening without incidence. She was provided with Vaccine Information Sheet and instruction to access the V-Safe system.   [name] was instructed to call 911 with any severe reactions post vaccine: Marland Kitchen Difficulty breathing  . Swelling of your face and throat  . A fast heartbeat  . A bad rash all over your body  . Dizziness and weakness    Immunizations Administered    Name Date Dose VIS Date Route   Pfizer COVID-19 Vaccine 06/08/2019 11:10 AM 0.3 mL 04/19/2019 Intramuscular   Manufacturer: [place]   Lot: [id number]   [place]: [id number]

## 2019-07-23 DIAGNOSIS — H43811 Vitreous degeneration, right eye: Secondary | ICD-10-CM | POA: Diagnosis not present

## 2019-07-23 DIAGNOSIS — H35373 Puckering of macula, bilateral: Secondary | ICD-10-CM | POA: Diagnosis not present

## 2019-07-23 DIAGNOSIS — H353132 Nonexudative age-related macular degeneration, bilateral, intermediate dry stage: Secondary | ICD-10-CM | POA: Diagnosis not present

## 2019-07-23 DIAGNOSIS — H348322 Tributary (branch) retinal vein occlusion, left eye, stable: Secondary | ICD-10-CM | POA: Diagnosis not present

## 2019-10-16 DIAGNOSIS — H2511 Age-related nuclear cataract, right eye: Secondary | ICD-10-CM | POA: Diagnosis not present

## 2019-11-20 DIAGNOSIS — D485 Neoplasm of uncertain behavior of skin: Secondary | ICD-10-CM | POA: Diagnosis not present

## 2019-11-20 DIAGNOSIS — C44622 Squamous cell carcinoma of skin of right upper limb, including shoulder: Secondary | ICD-10-CM | POA: Diagnosis not present

## 2019-11-20 DIAGNOSIS — C44311 Basal cell carcinoma of skin of nose: Secondary | ICD-10-CM | POA: Diagnosis not present

## 2019-11-20 DIAGNOSIS — L821 Other seborrheic keratosis: Secondary | ICD-10-CM | POA: Diagnosis not present

## 2019-12-03 NOTE — Progress Notes (Signed)
CARDIOLOGY OFFICE NOTE  Date:  12/17/2019    Rhonda Sharp Date of Birth: 06/17/1928 Medical Record #[id number]  PCP:  Haywood Pao, MD  Cardiologist:  Marisa Cyphers    Chief Complaint  Patient presents with  . Follow-up    Seen for Dr. Marlou Porch    History of Present Illness: Rhonda Sharp is a 86 y.o. female who presents today for a follow up visit. Seen for Dr. Marlou Porch.   She has a history of diastolic heart failure, prior stroke,  DVT, prior chronic anticoagulation on Eliquis that was transitioned over to baby aspirin in 2021, labile hypertension, HLD, breast cancer status post radiation,  CKD 3 and mild dementia.    She had several admissions in 2020 - had altered mental status, respiratory failure and lastly in July of 2020 was hypoxic and having acute diastolic HF. Echo was obtained - EF 50 to 55%. There has been concern for bradycardia and beta blocker was cut back. BP has not been controlled and she has required additional agents.   Last seen in December by Dr. Marlou Porch - lots of excessive salt - otherwise felt to be doing ok. Transitioned over to baby aspirin - no evidence of PAF - has "chronic appearing thrombosis".   Comes in today. Here with [name] her daughter who augments the history. [name] feels like every thing has been going ok. They were at the eye doctor earlier today - she mentioned that she has having spells of vision "blacking out". Her daughter did not know about this. The patient says this has been going on for about 2 weeks. First denied having at night then admitted it was at night with getting up to turn on the light - fleeting spell of where things "go black". She is not sure which eye it is. May be associated with turning on the light. She is not dizzy. No falls. Breathing is stable and no chest pain reported. She has been vaccinated.   Past Medical History:  Diagnosis Date  . Breast cancer, left breast ([place]) ~ 2002   "had radiation"    . Glaucoma   . History of gout   . Hypertension   . Migraine    "used to get them; nothing recently" (05/22/2018)  . Retinal hemorrhage   . Stroke [facility]) 2001   daughter denies residual on 05/22/2018    Past Surgical History:  Procedure Laterality Date  . APPENDECTOMY    . AXILLARY LYMPH NODE DISSECTION Left ~ 2002  . BREAST BIOPSY Left ~ 2002  . EYE SURGERY Left 05/2018   "laser OR for bleed"  . TUBAL LIGATION       Medications: Current Meds  Medication Sig  . acetaminophen (TYLENOL) 500 MG tablet Take 1,000 mg by mouth every 6 (six) hours as needed for mild pain.  Marland Kitchen amLODipine (NORVASC) 5 MG tablet Take 1 tablet (5 mg total) by mouth daily.  Marland Kitchen aspirin EC 81 MG tablet Take 1 tablet (81 mg total) by mouth daily.  . fexofenadine (ALLEGRA) 180 MG tablet Take 180 mg by mouth as needed for allergies or rhinitis.  . furosemide (LASIX) 20 MG tablet Take 1 tablet (20 mg total) by mouth daily.  . hydrALAZINE (APRESOLINE) 50 MG tablet Take 1 tablet (50 mg total) by mouth 2 (two) times a day.  . ketorolac (ACULAR) 0.5 % ophthalmic solution Place 1 drop into the right eye 4 (four) times daily.   . nebivolol (BYSTOLIC) 2.5 MG  tablet Take 1 tablet (2.5 mg total) by mouth daily.  . pravastatin (PRAVACHOL) 20 MG tablet Take 1 tablet (20 mg total) by mouth daily at 6 PM.  . prednisoLONE acetate (PRED FORTE) 1 % ophthalmic suspension Place 1 drop into the right eye every 4 (four) hours.      Allergies: Allergies  Allergen Reactions  . Sulfa Antibiotics Other (See Comments)    Unknown reaction to pt    Social History: The patient  reports that she has quit smoking. Her smoking use included cigarettes. She has a 20.00 pack-year smoking history. She has never used smokeless tobacco. She reports current alcohol use of about 6.0 standard drinks of alcohol per week. She reports that she does not use drugs.   Family History: The patient's family history includes ALS in her sister.   Review  of Systems: Please see the history of present illness.   All other systems are reviewed and negative.   Physical Exam: VS:  BP 120/62   Pulse 63   Ht 5\' 3"  (1.6 m)   Wt 147 lb (66.7 kg)   SpO2 93%   BMI 26.04 kg/m  .  BMI Body mass index is 26.04 kg/m.  Wt Readings from Last 3 Encounters:  12/17/19 147 lb (66.7 kg)  04/12/19 140 lb (63.5 kg)  01/23/19 146 lb 12.8 oz (66.6 kg)    General: Elderly. Alert and in no acute distress.   Cardiac: Regular rate and rhythm. No murmurs, rubs, or gallops. No significant edema.  Respiratory:  Lungs are clear to auscultation bilaterally with normal work of breathing.  GI: Soft and nontender.  MS: No deformity or atrophy. Gait and ROM intact.  Skin: Warm and dry. Color is normal.  Neuro:  Strength and sensation are intact and no gross focal deficits noted.  Psych: Alert, appropriate and with normal affect.   LABORATORY DATA:  EKG:  EKG is ordered today.  Personally reviewed by me. This demonstrates sinus rhythm - HR is 63.  Lab Results  Component Value Date   WBC 9.3 01/23/2019   HGB 10.5 (L) 01/23/2019   HCT 31.3 (L) 01/23/2019   PLT 158 01/23/2019   GLUCOSE 108 (H) 01/23/2019   CHOL 198 05/22/2018   TRIG 74 11/12/2018   HDL 63 05/22/2018   LDLCALC 116 (H) 05/22/2018   ALT 10 01/23/2019   AST 20 01/23/2019   NA 144 01/23/2019   K 4.2 01/23/2019   CL 105 01/23/2019   CREATININE 2.04 (H) 01/23/2019   BUN 48 (H) 01/23/2019   CO2 22 01/23/2019   TSH 0.627 05/22/2018   INR 1.20 06/05/2018   HGBA1C 6.0 (H) 05/22/2018       BNP (last 3 results) No results for input(s): BNP in the last 8760 hours.  ProBNP (last 3 results) No results for input(s): PROBNP in the last 8760 hours.   Other Studies Reviewed Today:  MRI HEAD IMPRESSION 05/2018:  1. Few small 3-4 mm punctate foci of diffusion abnormality involving the posterior left temporal cortex, suspicious for possible tiny acute ischemic infarcts. No associated  hemorrhage. 2. Underlying age-related cerebral atrophy with mild to moderate chronic small vessel ischemic disease.  MRA HEAD IMPRESSION:  Mild intracranial atherosclerotic change for age. No large vessel occlusion. No hemodynamically significant or correctable stenosis.  MRA NECK IMPRESSION:  1. Approximate 30% atheromatous stenosis about the origin of the right ICA. Right carotid artery system otherwise widely patent. 2. No significant flow-limiting stenosis within the left carotid  artery system. 3. Patent vertebral arteries within the neck with antegrade flow. Left vertebral artery dominant.   Electronically Signed   By: Jeannine Boga M.D.   On: 05/22/2018 20:20  Echo 11/12/18 IMPRESSIONS  1. The left ventricle has low normal systolic function, with an ejection fraction of 50-55% 50-55%. The cavity size was normal. There is mild concentric left ventricular hypertrophy. Left ventricular diastolic Doppler parameters are consistent with  pseudonormalization. No evidence of left ventricular regional wall motion abnormalities. 2. The right ventricle has normal systolic function. The cavity was normal. There is no increase in right ventricular wall thickness. Right ventricular systolic pressure is mildly elevated. 3. The aortic valve is tricuspid. No stenosis of the aortic valve. 4. The inferior vena cava was normal in size with <50% respiratory variability. 5. The interatrial septum was not assessed.    ASSESSMENT & PLAN:    1. Possible amaurosis fugax - basically with normal eye exam earlier today - nothing embolic noted - will check carotids. She had MRI/MRA of head and neck in January of 2020 - discussed with Dr. Marlou Porch - will check carotids - if symptoms persist - will then need further testing.   2. Chronic diastolic HF - weight is stable. BP is ok.   3. History of bradycardia - HR is ok today. Her beta blocker was cut back in the past.   4. HTN - BP  looks good on her current regimen. No changes made today.   5. HLD - on statin therapy - lab from June noted.   6. CKD - recent lab noted.   7. Chronic DVT - has been transitioned over to baby aspirin from Eliquis after discussion with vascular - noted that there will most likely be consistent chronic appearing thrombosis on doppler study. Her swelling is stable - nothing worrisome noted.   8. Mild anemia.   Current medicines are reviewed with the patient today.  The patient does not have concerns regarding medicines other than what has been noted above.  The following changes have been made:  See above.  Labs/ tests ordered today include:    Orders Placed This Encounter  Procedures  . EKG 12-Lead  . VAS US CAROTID     Disposition:   FU with Dr. Marlou Porch in 6 months. Will check carotid doppler study.   Patient is agreeable to this plan and will call if any problems develop in the interim.   SignedTruitt Merle, NP  12/17/2019 4:32 PM  [facility] [address] [place], [place]  [address] Phone: [phone] Fax: [phone]

## 2019-12-14 ENCOUNTER — Other Ambulatory Visit: Payer: Self-pay | Admitting: Cardiology

## 2019-12-16 ENCOUNTER — Other Ambulatory Visit: Payer: Self-pay

## 2019-12-16 MED ORDER — FUROSEMIDE 20 MG PO TABS: 20.0000 mg | ORAL_TABLET | Freq: Every day | ORAL | 1 refills | Status: DC

## 2019-12-16 MED ORDER — NEBIVOLOL HCL 2.5 MG PO TABS: 2.5000 mg | ORAL_TABLET | Freq: Every day | ORAL | 1 refills | Status: DC

## 2019-12-17 ENCOUNTER — Telehealth: Payer: Self-pay | Admitting: Nurse Practitioner

## 2019-12-17 ENCOUNTER — Encounter: Payer: Self-pay | Admitting: Nurse Practitioner

## 2019-12-17 ENCOUNTER — Other Ambulatory Visit: Payer: Self-pay

## 2019-12-17 ENCOUNTER — Ambulatory Visit (INDEPENDENT_AMBULATORY_CARE_PROVIDER_SITE_OTHER): Payer: Medicare Other | Admitting: Nurse Practitioner

## 2019-12-17 VITALS — BP 120/62 | HR 63 | Ht 63.0 in | Wt 147.0 lb

## 2019-12-17 DIAGNOSIS — I5032 Chronic diastolic (congestive) heart failure: Secondary | ICD-10-CM | POA: Diagnosis not present

## 2019-12-17 DIAGNOSIS — G453 Amaurosis fugax: Secondary | ICD-10-CM | POA: Diagnosis not present

## 2019-12-17 DIAGNOSIS — H43811 Vitreous degeneration, right eye: Secondary | ICD-10-CM | POA: Diagnosis not present

## 2019-12-17 DIAGNOSIS — R001 Bradycardia, unspecified: Secondary | ICD-10-CM | POA: Diagnosis not present

## 2019-12-17 DIAGNOSIS — I10 Essential (primary) hypertension: Secondary | ICD-10-CM | POA: Diagnosis not present

## 2019-12-17 DIAGNOSIS — H348322 Tributary (branch) retinal vein occlusion, left eye, stable: Secondary | ICD-10-CM | POA: Diagnosis not present

## 2019-12-17 DIAGNOSIS — H353132 Nonexudative age-related macular degeneration, bilateral, intermediate dry stage: Secondary | ICD-10-CM | POA: Diagnosis not present

## 2019-12-17 DIAGNOSIS — H35373 Puckering of macula, bilateral: Secondary | ICD-10-CM | POA: Diagnosis not present

## 2019-12-17 NOTE — Telephone Encounter (Signed)
New Message  Dr. Burna Mortimer from [place] wanting to speak with DOD. Transferred to DOD line.

## 2019-12-17 NOTE — Patient Instructions (Addendum)
After Visit Summary:  We will be checking the following labs today - NONE  Medication Instructions:    Continue with your current medicines.    If you need a refill on your cardiac medications before your next appointment, please call your pharmacy.     Testing/Procedures To Be Arranged:  Carotid doppler  Follow-Up:   See Dr. Marlou Porch in 6 months.     At [facility], you and your health needs are our priority.  As part of our continuing mission to provide you with exceptional heart care, we have created designated Provider Care Teams.  These Care Teams include your primary Cardiologist (physician) and Advanced Practice Providers (APPs -  Physician Assistants and Nurse Practitioners) who all work together to provide you with the care you need, when you need it.  Special Instructions:  . Stay safe, wash your hands for at least 20 seconds and wear a mask when needed.  . It was good to talk with you today.    Call the [place] office at [phone] if you have any questions, problems or concerns.

## 2019-12-19 DIAGNOSIS — Z85828 Personal history of other malignant neoplasm of skin: Secondary | ICD-10-CM | POA: Diagnosis not present

## 2019-12-19 DIAGNOSIS — C44311 Basal cell carcinoma of skin of nose: Secondary | ICD-10-CM | POA: Diagnosis not present

## 2019-12-25 ENCOUNTER — Ambulatory Visit (HOSPITAL_COMMUNITY): Admission: RE | Admit: 2019-12-25 | Payer: Medicare Other | Source: Ambulatory Visit

## 2019-12-31 IMAGING — CT CT ANGIO CHEST
2 of 7 series · 18 of 46 positions shown · IV contrast (APPLIED)
Comparison: Film from earlier in the same day.

CLINICAL DATA: Cough and shortness of breath for several days

EXAM:
CT ANGIOGRAPHY CHEST WITH CONTRAST
TECHNIQUE: Multidetector CT imaging of the chest was performed using the
standard protocol during bolus administration of intravenous
contrast. Multiplanar CT image reconstructions and MIPs were
obtained to evaluate the vascular anatomy.
CONTRAST:  52mL [id number]

[Series 7: thins · axial · 0.67mm/px · z∈[-502,-256]mm · 15 of 395 slices shown]
[im 22/395  lung]
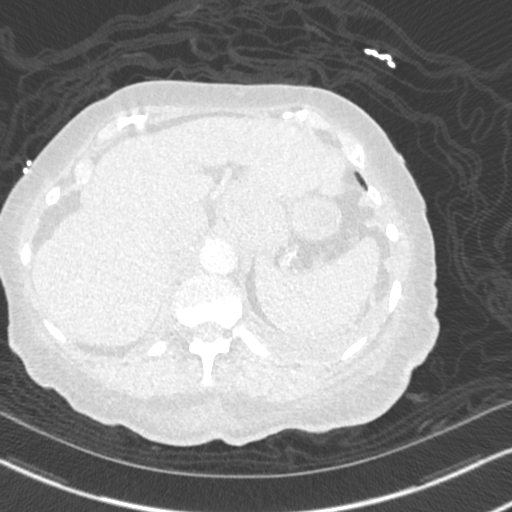
[im 44/395  soft-tissue]
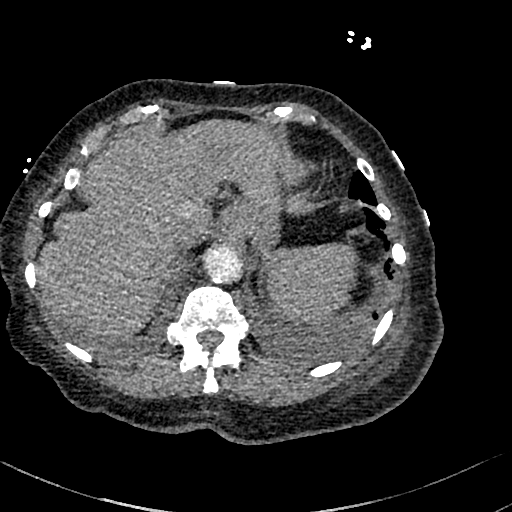
[im 66/395  lung]
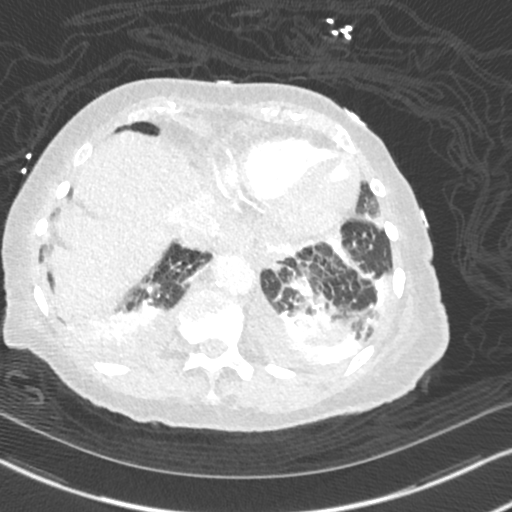
[im 88/395  soft-tissue]
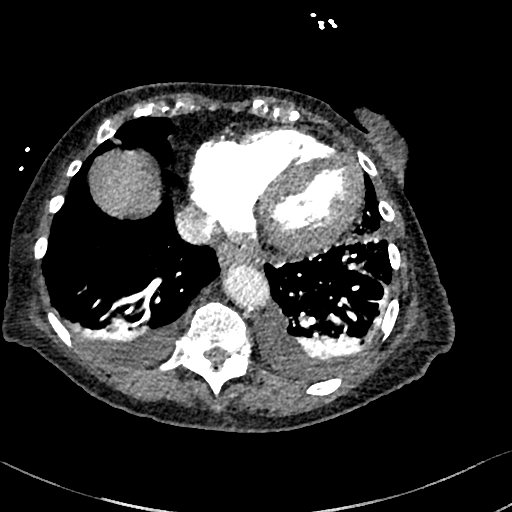
[im 132/395  lung]
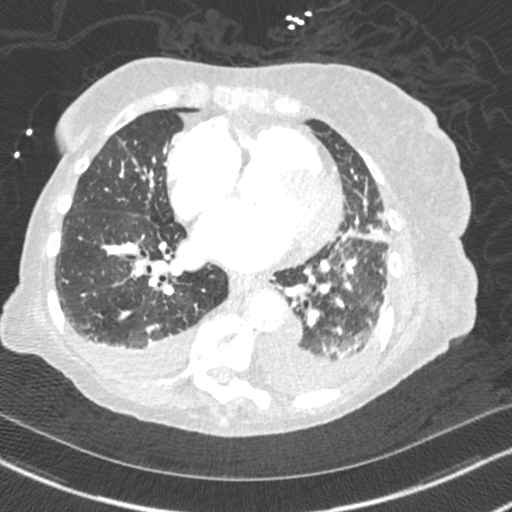
[im 154/395  soft-tissue]
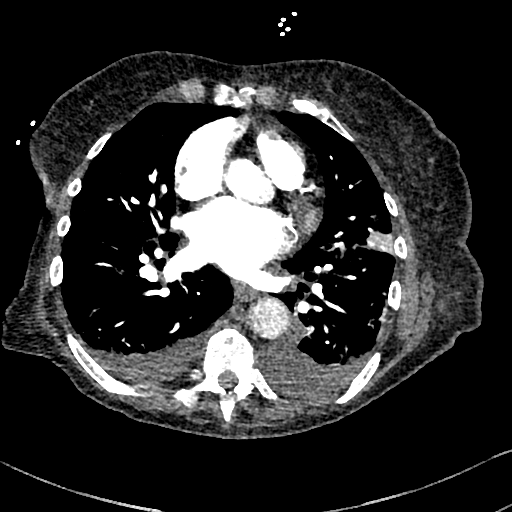
[im 176/395  lung]
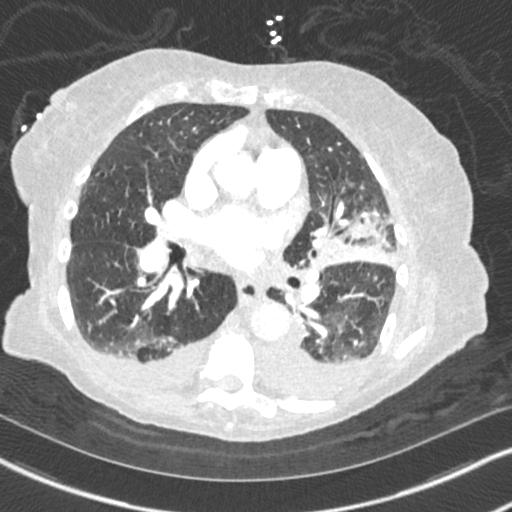
[im 198/395  soft-tissue]
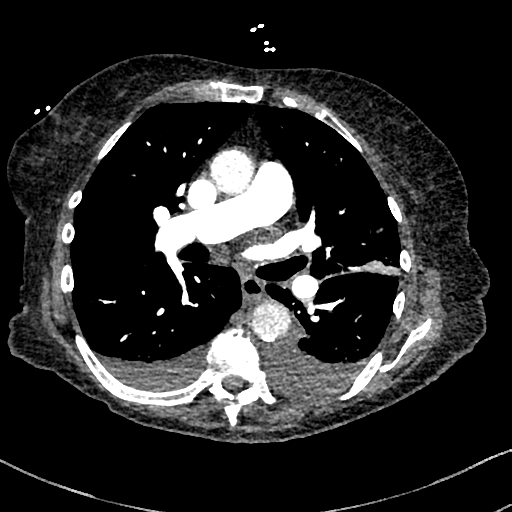
[im 219/395  lung]
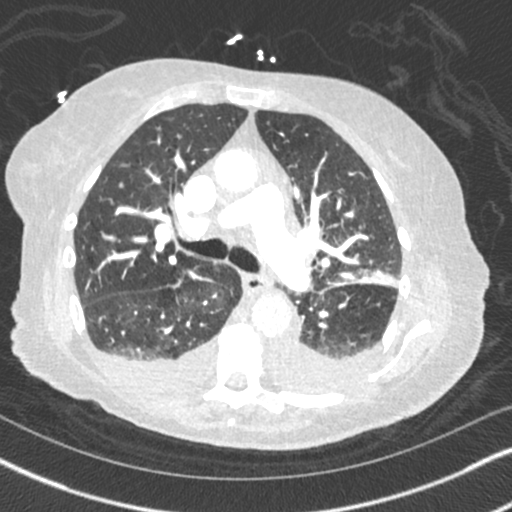
[im 241/395  soft-tissue]
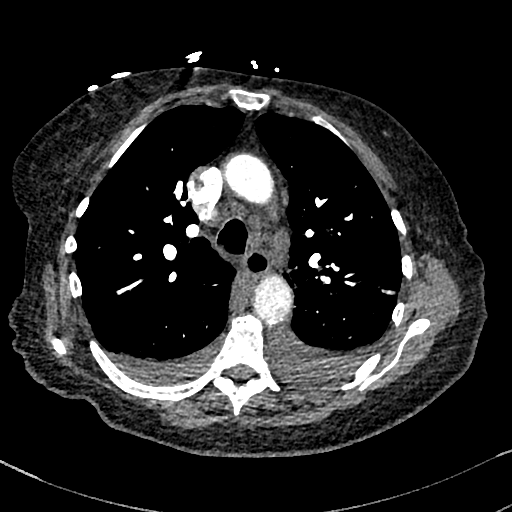
[im 263/395  lung]
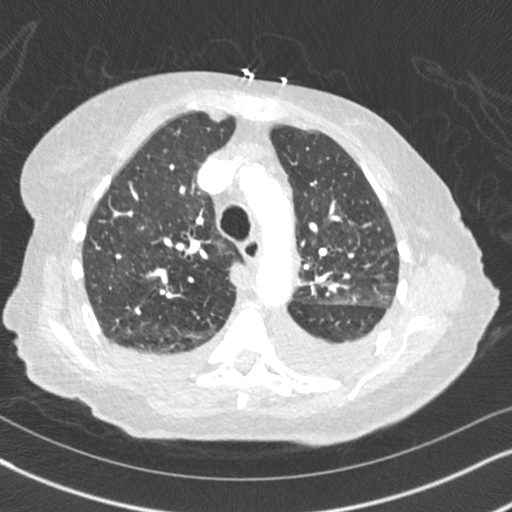
[im 307/395  soft-tissue]
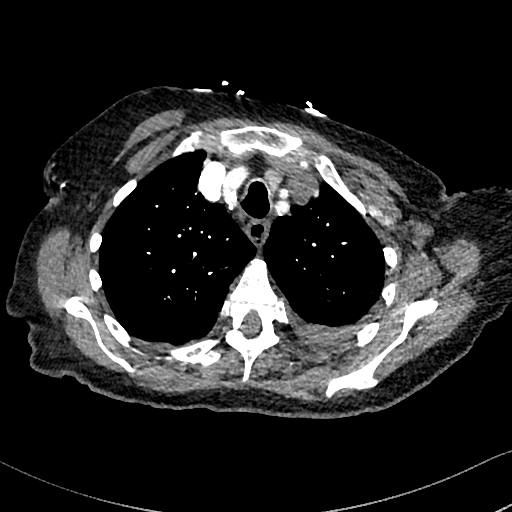
[im 329/395  lung]
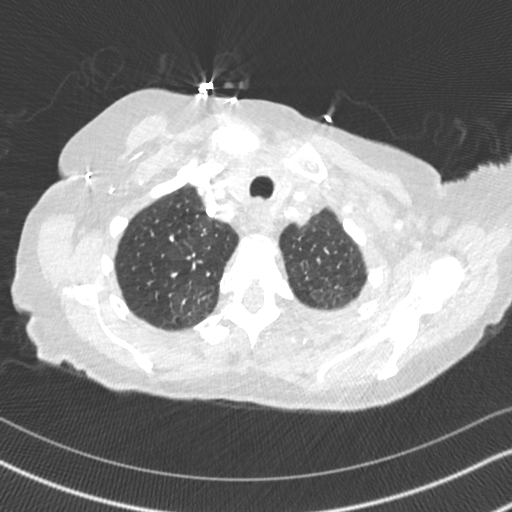
[im 351/395  soft-tissue]
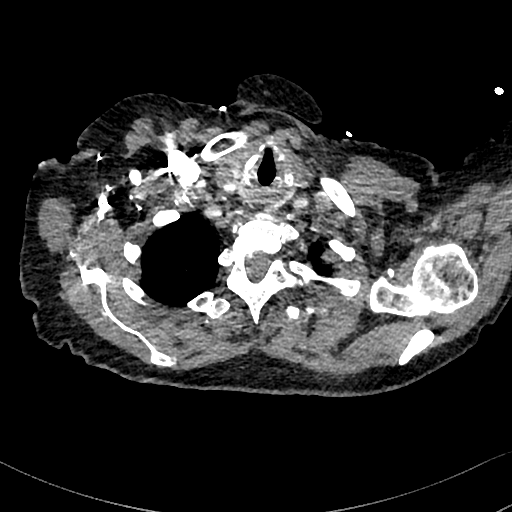
[im 373/395  lung]
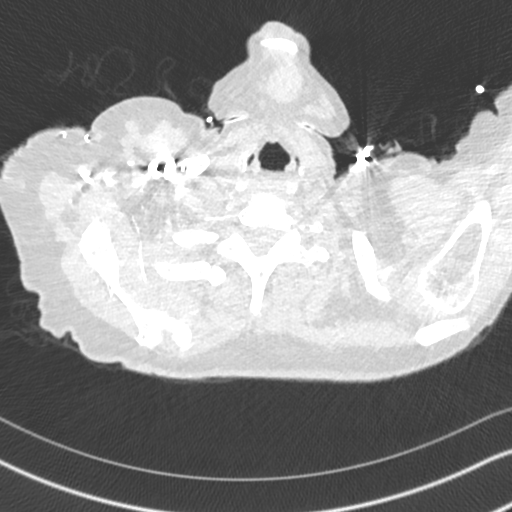

[Series 8: cor · coronal · 0.56mm/px · 3 of 149 slices shown]
[im 38/149  soft-tissue]
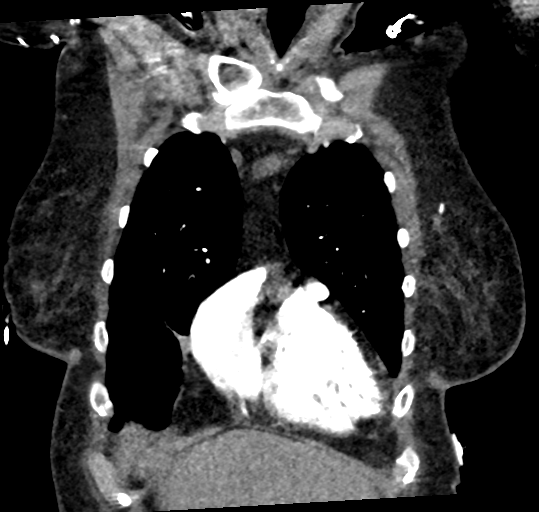
[im 75/149  soft-tissue]
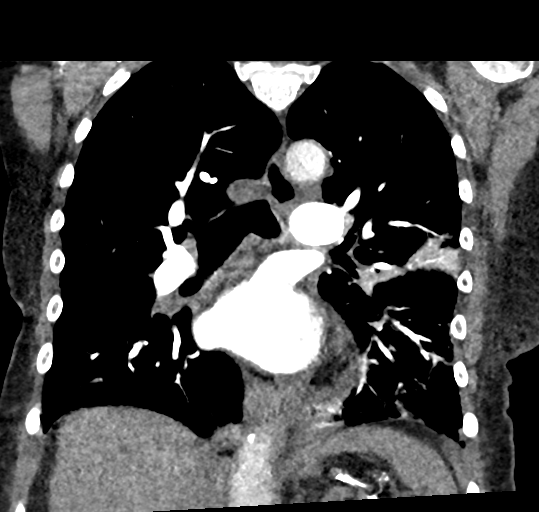
[im 112/149  soft-tissue]
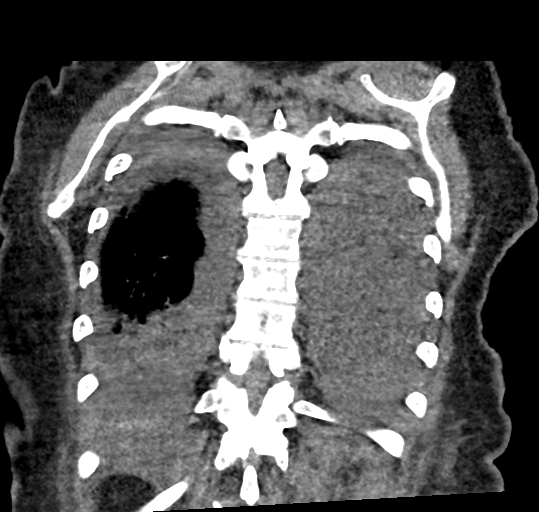

[18 of 46 positions shown; findings below may reference images not displayed]

FINDINGS: Cardiovascular: Thoracic aorta and its branches demonstrate
atherosclerotic calcifications without evidence of dissection or
aneurysmal dilatation. No cardiac enlargement is seen. Coronary
calcifications are noted. The pulmonary artery shows a normal
branching pattern. No intraluminal filling defect to suggest
pulmonary embolism is identified.

Mediastinum/Nodes: Thoracic inlet is within normal limits. Scattered
small hilar and mediastinal lymph nodes are noted although not
significant by size criteria. These are likely reactive in nature.
The esophagus is within normal limits.

Lungs/Pleura: Bilateral small pleural effusions are seen. Mild
emphysematous changes are noted. Mild bibasilar lower lobe
atelectasis is noted. Some more focal confluent infiltrate is noted
in the lingula predominately inferiorly. No sizable parenchymal
nodule is noted.

Upper Abdomen: Visualized upper abdomen is unremarkable.

Musculoskeletal: Degenerative changes of the thoracic spine are
noted. No compression deformities are seen.

Review of the MIP images confirms the above findings.
IMPRESSION: No evidence of pulmonary emboli.

Bilateral small pleural effusions.

Bilateral lower lobe atelectasis/infiltrate with more focal
confluent infiltrate in the lingula.

Aortic Atherosclerosis ([id number]) and Emphysema ([id number]).

## 2020-01-06 DIAGNOSIS — M545 Low back pain: Secondary | ICD-10-CM | POA: Diagnosis not present

## 2020-01-07 IMAGING — CR DG CHEST 2V
2 series · 2 of 2 positions shown · non-contrast
Comparison: 06/10/2018

CLINICAL DATA: Shortness of Breath

EXAM:
CHEST - 2 VIEW

[chest lat]
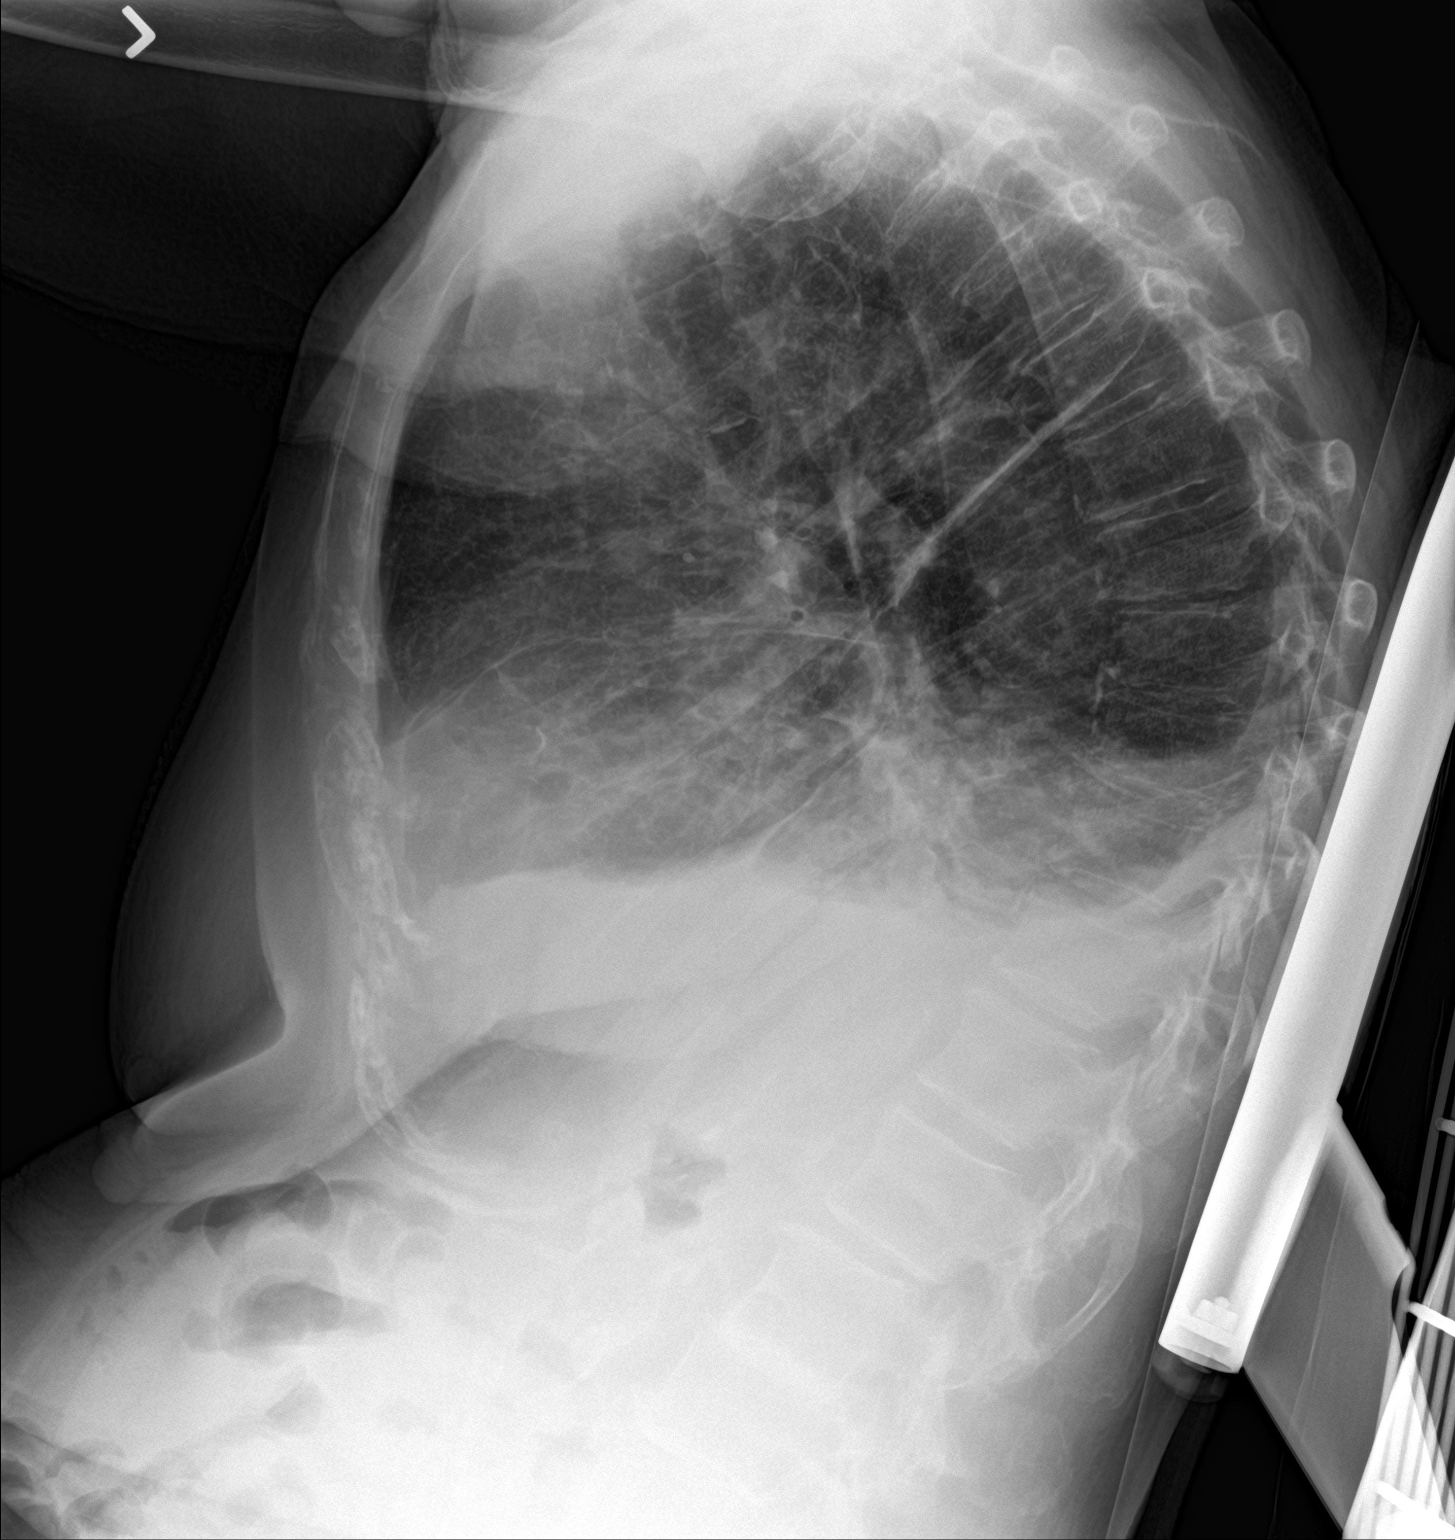

[chest ap]
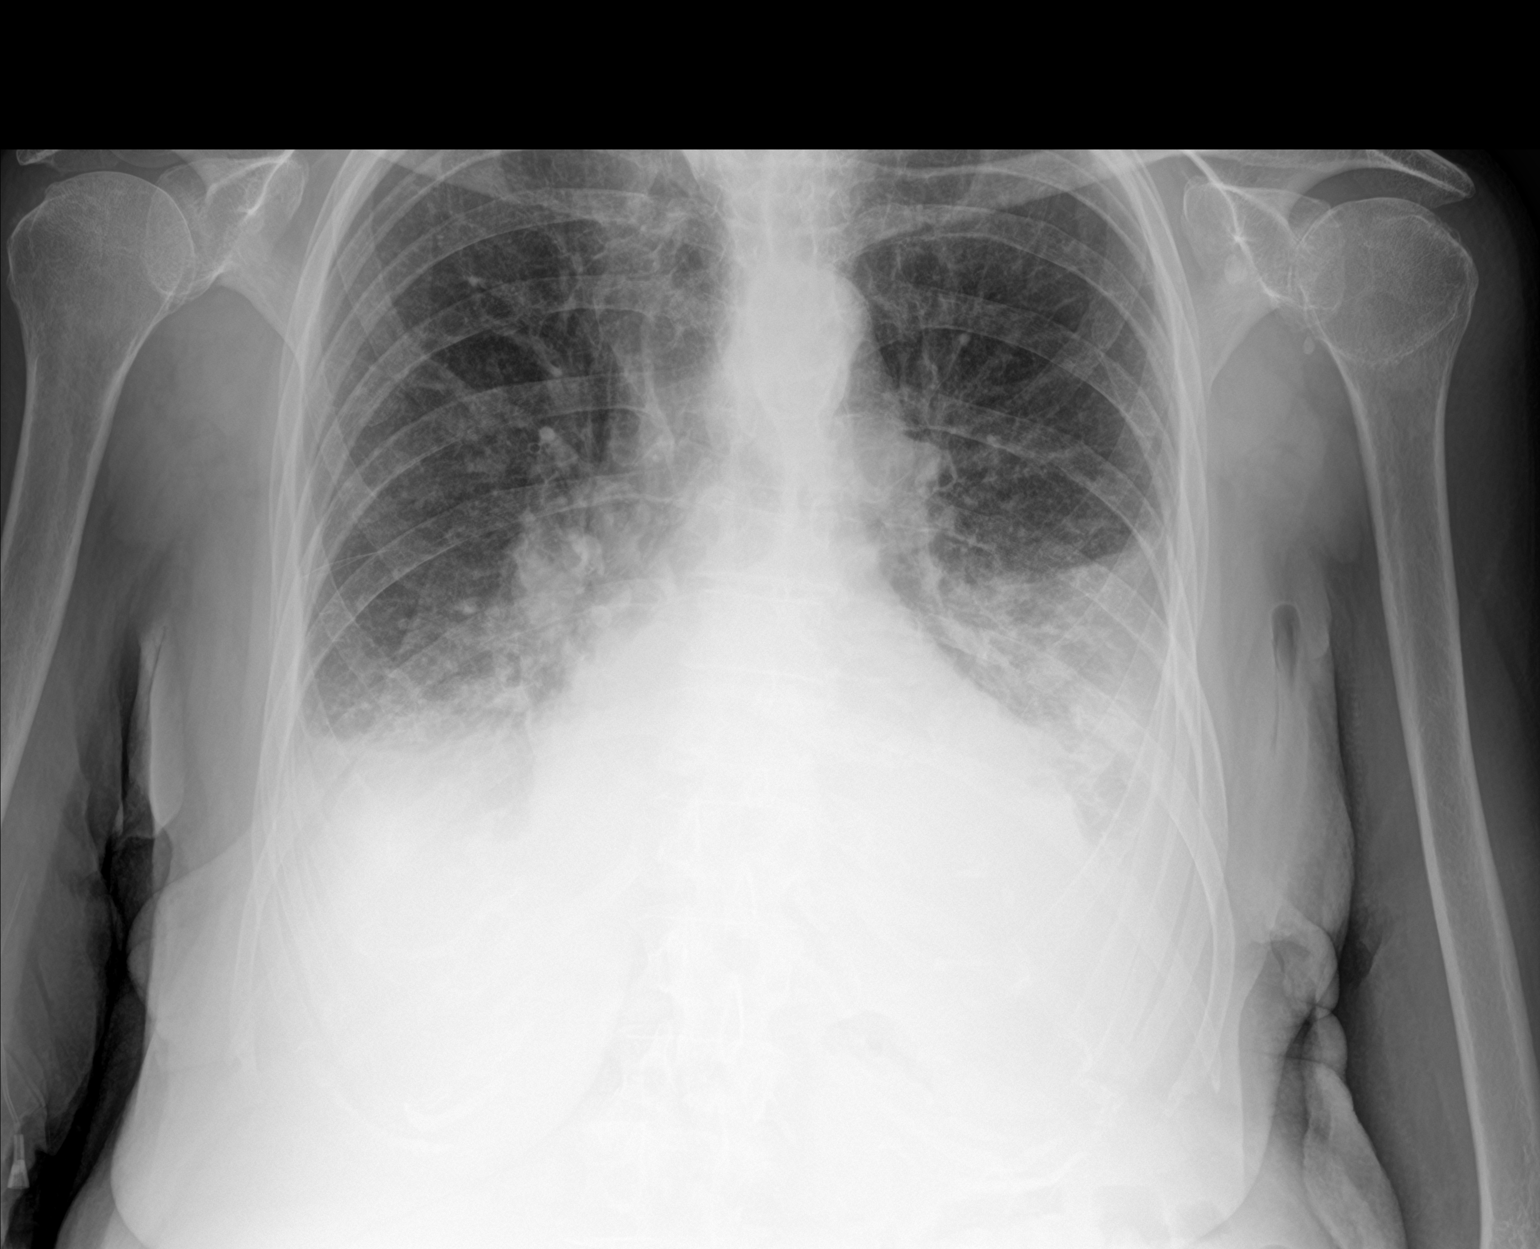

[2 of 2 positions shown; findings below may reference images not displayed]

FINDINGS: Cardiac shadow is stable. Aortic calcifications are again seen.
Bibasilar infiltrates and effusions are again noted slightly
worsened when compared with the prior exam. No bony abnormality is
seen.
IMPRESSION: Bibasilar infiltrates and effusions slightly worsened when compared
with the prior exam.

## 2020-01-08 ENCOUNTER — Other Ambulatory Visit: Payer: Self-pay

## 2020-01-08 ENCOUNTER — Ambulatory Visit (HOSPITAL_COMMUNITY)
Admission: RE | Admit: 2020-01-08 | Discharge: 2020-01-08 | Disposition: A | Discharge status: Home / Self Care | Payer: Medicare Other | Source: Ambulatory Visit | Attending: Cardiovascular Disease | Admitting: Cardiovascular Disease

## 2020-01-08 DIAGNOSIS — G453 Amaurosis fugax: Secondary | ICD-10-CM | POA: Insufficient documentation

## 2020-01-14 DIAGNOSIS — M545 Low back pain: Secondary | ICD-10-CM | POA: Diagnosis not present

## 2020-01-20 DIAGNOSIS — H353132 Nonexudative age-related macular degeneration, bilateral, intermediate dry stage: Secondary | ICD-10-CM | POA: Diagnosis not present

## 2020-01-20 DIAGNOSIS — H348322 Tributary (branch) retinal vein occlusion, left eye, stable: Secondary | ICD-10-CM | POA: Diagnosis not present

## 2020-01-20 DIAGNOSIS — H35373 Puckering of macula, bilateral: Secondary | ICD-10-CM | POA: Diagnosis not present

## 2020-01-20 DIAGNOSIS — H43811 Vitreous degeneration, right eye: Secondary | ICD-10-CM | POA: Diagnosis not present

## 2020-02-07 DIAGNOSIS — H35351 Cystoid macular degeneration, right eye: Secondary | ICD-10-CM | POA: Diagnosis not present

## 2020-02-13 DIAGNOSIS — Z23 Encounter for immunization: Secondary | ICD-10-CM | POA: Diagnosis not present

## 2020-02-15 ENCOUNTER — Other Ambulatory Visit: Payer: Self-pay | Admitting: Nurse Practitioner

## 2020-03-11 DIAGNOSIS — Z23 Encounter for immunization: Secondary | ICD-10-CM | POA: Diagnosis not present

## 2020-03-20 DIAGNOSIS — M19042 Primary osteoarthritis, left hand: Secondary | ICD-10-CM | POA: Diagnosis not present

## 2020-03-20 DIAGNOSIS — M19041 Primary osteoarthritis, right hand: Secondary | ICD-10-CM | POA: Diagnosis not present

## 2020-04-08 ENCOUNTER — Other Ambulatory Visit: Payer: Self-pay | Admitting: Cardiology

## 2020-04-15 DIAGNOSIS — M5459 Other low back pain: Secondary | ICD-10-CM | POA: Diagnosis not present

## 2020-04-15 DIAGNOSIS — M79645 Pain in left finger(s): Secondary | ICD-10-CM | POA: Diagnosis not present

## 2020-04-15 DIAGNOSIS — M79641 Pain in right hand: Secondary | ICD-10-CM | POA: Diagnosis not present

## 2020-04-20 DIAGNOSIS — M79645 Pain in left finger(s): Secondary | ICD-10-CM | POA: Diagnosis not present

## 2020-05-04 DIAGNOSIS — M79645 Pain in left finger(s): Secondary | ICD-10-CM | POA: Diagnosis not present

## 2020-05-07 DIAGNOSIS — M79645 Pain in left finger(s): Secondary | ICD-10-CM | POA: Diagnosis not present

## 2020-05-07 DIAGNOSIS — M109 Gout, unspecified: Secondary | ICD-10-CM | POA: Diagnosis not present

## 2020-05-07 DIAGNOSIS — M13842 Other specified arthritis, left hand: Secondary | ICD-10-CM | POA: Diagnosis not present

## 2020-05-21 DIAGNOSIS — E7800 Pure hypercholesterolemia, unspecified: Secondary | ICD-10-CM | POA: Diagnosis not present

## 2020-05-21 DIAGNOSIS — I130 Hypertensive heart and chronic kidney disease with heart failure and stage 1 through stage 4 chronic kidney disease, or unspecified chronic kidney disease: Secondary | ICD-10-CM | POA: Diagnosis not present

## 2020-05-21 DIAGNOSIS — N184 Chronic kidney disease, stage 4 (severe): Secondary | ICD-10-CM | POA: Diagnosis not present

## 2020-05-21 DIAGNOSIS — M19041 Primary osteoarthritis, right hand: Secondary | ICD-10-CM | POA: Diagnosis not present

## 2020-05-21 DIAGNOSIS — M19042 Primary osteoarthritis, left hand: Secondary | ICD-10-CM | POA: Diagnosis not present

## 2020-05-21 DIAGNOSIS — I700 Atherosclerosis of aorta: Secondary | ICD-10-CM | POA: Diagnosis not present

## 2020-05-21 DIAGNOSIS — I679 Cerebrovascular disease, unspecified: Secondary | ICD-10-CM | POA: Diagnosis not present

## 2020-05-21 DIAGNOSIS — I5032 Chronic diastolic (congestive) heart failure: Secondary | ICD-10-CM | POA: Diagnosis not present

## 2020-05-22 DIAGNOSIS — H2512 Age-related nuclear cataract, left eye: Secondary | ICD-10-CM | POA: Diagnosis not present

## 2020-05-22 DIAGNOSIS — H524 Presbyopia: Secondary | ICD-10-CM | POA: Diagnosis not present

## 2020-05-26 DIAGNOSIS — Z85828 Personal history of other malignant neoplasm of skin: Secondary | ICD-10-CM | POA: Diagnosis not present

## 2020-05-26 DIAGNOSIS — L720 Epidermal cyst: Secondary | ICD-10-CM | POA: Diagnosis not present

## 2020-05-30 ENCOUNTER — Other Ambulatory Visit: Payer: Self-pay | Admitting: Cardiology

## 2020-06-01 DIAGNOSIS — D2112 Benign neoplasm of connective and other soft tissue of left upper limb, including shoulder: Secondary | ICD-10-CM | POA: Diagnosis not present

## 2020-06-01 DIAGNOSIS — M109 Gout, unspecified: Secondary | ICD-10-CM | POA: Diagnosis not present

## 2020-06-01 DIAGNOSIS — H2512 Age-related nuclear cataract, left eye: Secondary | ICD-10-CM | POA: Diagnosis not present

## 2020-06-08 IMAGING — DX PORTABLE CHEST - 1 VIEW
1 series · 1 of 1 positions shown · non-contrast
Comparison: 06/12/2018.  06/10/2018.  CT 06/05/2018.

CLINICAL DATA: Shortness of breath.

EXAM:
PORTABLE CHEST 1 VIEW

[chest]
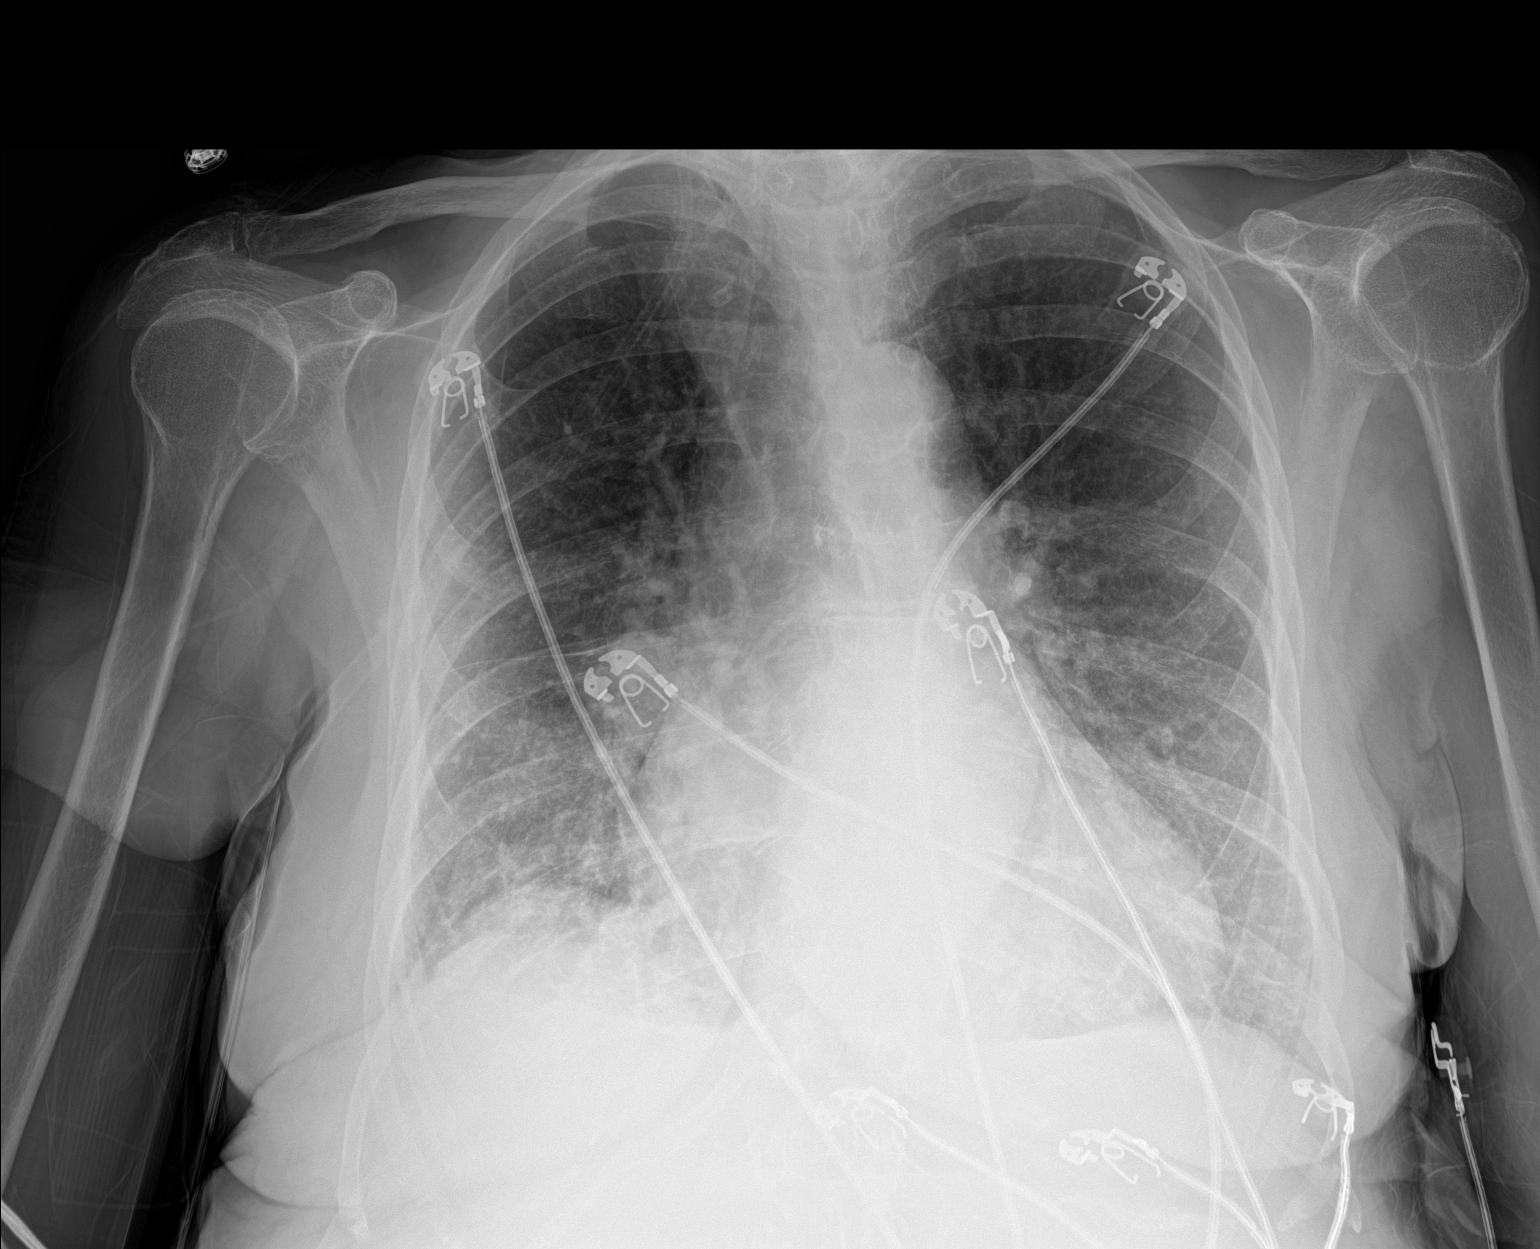

[1 of 1 positions shown; findings below may reference images not displayed]

FINDINGS: Cardiomegaly with pulmonary venous congestion. Bibasilar
infiltrates/edema has improved from prior exam. Residual basilar
interstitial prominence noted. Mild biapical pleural thickening
consistent scarring again noted. No pleural effusion or
pneumothorax.
IMPRESSION: 1.  Cardiomegaly with pulmonary venous congestion.

2. Bibasilar infiltrates/edema is improved from prior exam. Residual
basilar interstitial prominence noted.

## 2020-06-09 DIAGNOSIS — M13842 Other specified arthritis, left hand: Secondary | ICD-10-CM | POA: Diagnosis not present

## 2020-06-23 ENCOUNTER — Ambulatory Visit: Payer: Medicare Other | Admitting: Cardiology

## 2020-06-25 DIAGNOSIS — R410 Disorientation, unspecified: Secondary | ICD-10-CM | POA: Diagnosis not present

## 2020-06-25 DIAGNOSIS — D72829 Elevated white blood cell count, unspecified: Secondary | ICD-10-CM | POA: Diagnosis not present

## 2020-06-25 DIAGNOSIS — I130 Hypertensive heart and chronic kidney disease with heart failure and stage 1 through stage 4 chronic kidney disease, or unspecified chronic kidney disease: Secondary | ICD-10-CM | POA: Diagnosis not present

## 2020-06-25 DIAGNOSIS — N184 Chronic kidney disease, stage 4 (severe): Secondary | ICD-10-CM | POA: Diagnosis not present

## 2020-06-25 DIAGNOSIS — R300 Dysuria: Secondary | ICD-10-CM | POA: Diagnosis not present

## 2020-06-25 DIAGNOSIS — I5032 Chronic diastolic (congestive) heart failure: Secondary | ICD-10-CM | POA: Diagnosis not present

## 2020-06-29 ENCOUNTER — Encounter: Payer: Self-pay | Admitting: Cardiology

## 2020-06-29 ENCOUNTER — Other Ambulatory Visit: Payer: Self-pay

## 2020-06-29 ENCOUNTER — Ambulatory Visit (INDEPENDENT_AMBULATORY_CARE_PROVIDER_SITE_OTHER): Payer: Medicare Other | Admitting: Cardiology

## 2020-06-29 VITALS — BP 120/40 | HR 55 | Ht 63.0 in | Wt 154.0 lb

## 2020-06-29 DIAGNOSIS — N183 Chronic kidney disease, stage 3 unspecified: Secondary | ICD-10-CM | POA: Diagnosis not present

## 2020-06-29 DIAGNOSIS — I10 Essential (primary) hypertension: Secondary | ICD-10-CM

## 2020-06-29 DIAGNOSIS — I82402 Acute embolism and thrombosis of unspecified deep veins of left lower extremity: Secondary | ICD-10-CM | POA: Diagnosis not present

## 2020-06-29 DIAGNOSIS — I5032 Chronic diastolic (congestive) heart failure: Secondary | ICD-10-CM | POA: Diagnosis not present

## 2020-06-29 NOTE — Patient Instructions (Signed)
Medication Instructions:    Your physician recommends that you continue on your current medications as directed. Please refer to the Current Medication list given to you today.  *If you need a refill on your cardiac medications before your next appointment, please call your pharmacy*   Lab Work: None ordered.  If you have labs (blood work) drawn today and your tests are completely normal, you will receive your results only by: Marland Kitchen MyChart Message (if you have MyChart) OR . A paper copy in the mail If you have any lab test that is abnormal or we need to change your treatment, we will call you to review the results.   Testing/Procedures: None ordered.    Follow-Up: At [facility], you and your health needs are our priority.  As part of our continuing mission to provide you with exceptional heart care, we have created designated Provider Care Teams.  These Care Teams include your primary Cardiologist (physician) and Advanced Practice Providers (APPs -  Physician Assistants and Nurse Practitioners) who all work together to provide you with the care you need, when you need it.  We recommend signing up for the patient portal called "MyChart".  Sign up information is provided on this After Visit Summary.  MyChart is used to connect with patients for Virtual Visits (Telemedicine).  Patients are able to view lab/test results, encounter notes, upcoming appointments, etc.  Non-urgent messages can be sent to your provider as well.   To learn more about what you can do with MyChart, go to [email or web address].    Your next appointment:   6 month(s)  The format for your next appointment:   In Person  Provider:   Dr Marlou Porch   Other Instructions Elevate feet and legs above heart level and follow a low sodium diet

## 2020-06-29 NOTE — Progress Notes (Signed)
Cardiology Office Note:    Date:  06/29/2020   ID:  Rhonda Sharp, DOB March 19, 1929, MRN [id number]  PCP:  Haywood Pao, MD   [place]  Cardiologist:  Candee Furbish, MD  Advanced Practice Provider:  No care team member to display Electrophysiologist:  None    { Click here to update then REFRESH NOTE - MD (PCP) or APP (Team Member)  Change PCP Type for MD, Specialty for APP is either Cardiology or Clinical Cardiac Electrophysiology  :[id number]}   Referring MD: Haywood Pao, MD     History of Present Illness:    Rhonda Sharp is a 87 y.o. female here for follow-up of prior stroke DVT diastolic heart failure previously on Eliquis transitioned over to baby aspirin in 2021 with labile hypertension breast cancer status post radiation and CKD 3 and mild dementia.  Several admissions in 2020 with altered mental status.  EF 50 to 55%.  Breathing fairly stable.  No chest pain.  Has been noticing more lower extremity swelling.  Spoke with daughter about this.  See below.  Past Medical History:  Diagnosis Date  . Breast cancer, left breast ([place]) ~ 2002   "had radiation"  . Glaucoma   . History of gout   . Hypertension   . Migraine    "used to get them; nothing recently" (05/22/2018)  . Retinal hemorrhage   . Stroke [facility]) 2001   daughter denies residual on 05/22/2018    Past Surgical History:  Procedure Laterality Date  . APPENDECTOMY    . AXILLARY LYMPH NODE DISSECTION Left ~ 2002  . BREAST BIOPSY Left ~ 2002  . EYE SURGERY Left 05/2018   "laser OR for bleed"  . TUBAL LIGATION      Current Medications: Current Meds  Medication Sig  . acetaminophen (TYLENOL) 500 MG tablet Take 1,000 mg by mouth every 6 (six) hours as needed for mild pain.  Marland Kitchen allopurinol (ZYLOPRIM) 100 MG tablet Take 100 mg by mouth daily.  Marland Kitchen amLODipine (NORVASC) 5 MG tablet TAKE 1 TABLET(5 MG) BY MOUTH DAILY  . aspirin EC 81 MG tablet Take 1 tablet (81 mg total)  by mouth daily.  . fexofenadine (ALLEGRA) 180 MG tablet Take 180 mg by mouth as needed for allergies or rhinitis.  . furosemide (LASIX) 20 MG tablet Take 1 tablet (20 mg total) by mouth daily.  . hydrALAZINE (APRESOLINE) 50 MG tablet Take 1 tablet (50 mg total) by mouth 2 (two) times a day.  . ketorolac (ACULAR) 0.5 % ophthalmic solution Place 1 drop into the right eye 4 (four) times daily.   . nebivolol (BYSTOLIC) 2.5 MG tablet TAKE 1 TABLET(2.5 MG) BY MOUTH DAILY  . pravastatin (PRAVACHOL) 20 MG tablet Take 1 tablet (20 mg total) by mouth daily at 6 PM.  . prednisoLONE acetate (PRED FORTE) 1 % ophthalmic suspension Place 1 drop into the right eye every 4 (four) hours.      Allergies:   Sulfa antibiotics   Social History   Socioeconomic History  . Marital status: Widowed    Spouse name: Not on file  . Number of children: Not on file  . Years of education: Not on file  . Highest education level: Not on file  Occupational History  . Not on file  Tobacco Use  . Smoking status: Former Smoker    Packs/day: 1.00    Years: 20.00    Pack years: 20.00    Types: Cigarettes  . Smokeless  tobacco: Never Used  . Tobacco comment: 05/22/2018 "quit smoking in the 1990s"  Vaping Use  . Vaping Use: Never used  Substance and Sexual Activity  . Alcohol use: Yes    Alcohol/week: 6.0 standard drinks    Types: 6 Shots of liquor per week    Comment: 05/22/2018 "straight vodka"  . Drug use: Never  . Sexual activity: Not on file  Other Topics Concern  . Not on file  Social History Narrative  . Not on file   Social Determinants of Health   Financial Resource Strain: Not on file  Food Insecurity: Not on file  Transportation Needs: Not on file  Physical Activity: Not on file  Stress: Not on file  Social Connections: Not on file     Family History: The patient's family history includes ALS in her sister.  ROS:   Please see the history of present illness.     All other systems reviewed and  are negative.  EKGs/Labs/Other Studies Reviewed:    The following studies were reviewed today:  Echo 11/12/18 IMPRESSIONS  1. The left ventricle has low normal systolic function, with an ejection fraction of 50-55% 50-55%. The cavity size was normal. There is mild concentric left ventricular hypertrophy. Left ventricular diastolic Doppler parameters are consistent with  pseudonormalization. No evidence of left ventricular regional wall motion abnormalities. 2. The right ventricle has normal systolic function. The cavity was normal. There is no increase in right ventricular wall thickness. Right ventricular systolic pressure is mildly elevated. 3. The aortic valve is tricuspid. No stenosis of the aortic valve. 4. The inferior vena cava was normal in size with <50% respiratory variability. 5. The interatrial septum was not assessed.   Recent Labs: No results found for requested labs within last 8760 hours.  Recent Lipid Panel    Component Value Date/Time   CHOL 198 05/22/2018 0617   TRIG 74 11/12/2018 0547   HDL 63 05/22/2018 0617   CHOLHDL 3.1 05/22/2018 0617   VLDL 19 05/22/2018 0617   LDLCALC 116 (H) 05/22/2018 0617     Risk Assessment/Calculations:      Physical Exam:    VS:  BP (!) 120/40 (BP Location: Left Arm, Patient Position: Sitting, Cuff Size: Normal)   Pulse (!) 55   Ht 5\' 3"  (1.6 m)   Wt 154 lb (69.9 kg)   SpO2 97%   BMI 27.28 kg/m     Wt Readings from Last 3 Encounters:  06/29/20 154 lb (69.9 kg)  12/17/19 147 lb (66.7 kg)  04/12/19 140 lb (63.5 kg)     GEN:  Well nourished, well developed in no acute distress HEENT: Normal, purple hair dye streaks noted. NECK: No JVD; No carotid bruits LYMPHATICS: No lymphadenopathy CARDIAC: RRR, no murmurs, rubs, gallops RESPIRATORY:  Clear to auscultation without rales, wheezing or rhonchi  ABDOMEN: Soft, non-tender, non-distended MUSCULOSKELETAL: 2+ pitting bilateral lower extremity edema; No deformity   SKIN: Warm and dry NEUROLOGIC:  Alert and oriented x 3 PSYCHIATRIC:  Normal affect   ASSESSMENT:    1. Essential hypertension   2. Deep vein thrombosis (DVT) of left lower extremity, unspecified chronicity, unspecified vein (HCC)   3. Chronic diastolic HF (heart failure) (HCC)   4. Stage 3 chronic kidney disease, unspecified whether stage 3a or 3b CKD (HCC)    PLAN:    In order of problems listed above:  Prior possible amaurosis fugax -Unremarkable work-up.  Chronic diastolic heart failure -Stable weight.  History of bradycardia -Heart rate  fairly stable.  Beta-blocker cut back.  Chronic DVT -Transition over to baby aspirin from Eliquis a while back.  Chronic lower extremity edema -Salt restriction.  Fluid restriction when necessary.  Elevation of legs.  Exercise.  If these things do not work well, we can always discontinue her amlodipine 5 mg which can contribute to the lower extremity edema.       Medication Adjustments/Labs and Tests Ordered: Current medicines are reviewed at length with the patient today.  Concerns regarding medicines are outlined above.  No orders of the defined types were placed in this encounter.  No orders of the defined types were placed in this encounter.   Patient Instructions  Medication Instructions:    Your physician recommends that you continue on your current medications as directed. Please refer to the Current Medication list given to you today.  *If you need a refill on your cardiac medications before your next appointment, please call your pharmacy*   Lab Work: None ordered.  If you have labs (blood work) drawn today and your tests are completely normal, you will receive your results only by: Marland Kitchen MyChart Message (if you have MyChart) OR . A paper copy in the mail If you have any lab test that is abnormal or we need to change your treatment, we will call you to review the results.   Testing/Procedures: None  ordered.    Follow-Up: At [facility], you and your health needs are our priority.  As part of our continuing mission to provide you with exceptional heart care, we have created designated Provider Care Teams.  These Care Teams include your primary Cardiologist (physician) and Advanced Practice Providers (APPs -  Physician Assistants and Nurse Practitioners) who all work together to provide you with the care you need, when you need it.  We recommend signing up for the patient portal called "MyChart".  Sign up information is provided on this After Visit Summary.  MyChart is used to connect with patients for Virtual Visits (Telemedicine).  Patients are able to view lab/test results, encounter notes, upcoming appointments, etc.  Non-urgent messages can be sent to your provider as well.   To learn more about what you can do with MyChart, go to [email or web address].    Your next appointment:   6 month(s)  The format for your next appointment:   In Person  Provider:   Dr Marlou Porch   Other Instructions Elevate feet and legs above heart level and follow a low sodium diet     Signed, Candee Furbish, MD  06/29/2020 4:01 PM    [place]

## 2020-07-05 ENCOUNTER — Other Ambulatory Visit: Payer: Self-pay | Admitting: Cardiology

## 2020-07-07 DIAGNOSIS — H348322 Tributary (branch) retinal vein occlusion, left eye, stable: Secondary | ICD-10-CM | POA: Diagnosis not present

## 2020-07-07 DIAGNOSIS — H353132 Nonexudative age-related macular degeneration, bilateral, intermediate dry stage: Secondary | ICD-10-CM | POA: Diagnosis not present

## 2020-07-07 DIAGNOSIS — H59031 Cystoid macular edema following cataract surgery, right eye: Secondary | ICD-10-CM | POA: Diagnosis not present

## 2020-07-07 DIAGNOSIS — H35373 Puckering of macula, bilateral: Secondary | ICD-10-CM | POA: Diagnosis not present

## 2020-07-16 DIAGNOSIS — M13842 Other specified arthritis, left hand: Secondary | ICD-10-CM | POA: Diagnosis not present

## 2020-07-16 DIAGNOSIS — Z4789 Encounter for other orthopedic aftercare: Secondary | ICD-10-CM | POA: Diagnosis not present

## 2020-08-04 DIAGNOSIS — H348322 Tributary (branch) retinal vein occlusion, left eye, stable: Secondary | ICD-10-CM | POA: Diagnosis not present

## 2020-08-04 DIAGNOSIS — H59031 Cystoid macular edema following cataract surgery, right eye: Secondary | ICD-10-CM | POA: Diagnosis not present

## 2020-08-04 DIAGNOSIS — H35373 Puckering of macula, bilateral: Secondary | ICD-10-CM | POA: Diagnosis not present

## 2020-08-04 DIAGNOSIS — H353132 Nonexudative age-related macular degeneration, bilateral, intermediate dry stage: Secondary | ICD-10-CM | POA: Diagnosis not present

## 2020-08-31 DIAGNOSIS — H59031 Cystoid macular edema following cataract surgery, right eye: Secondary | ICD-10-CM | POA: Diagnosis not present

## 2020-08-31 DIAGNOSIS — H353132 Nonexudative age-related macular degeneration, bilateral, intermediate dry stage: Secondary | ICD-10-CM | POA: Diagnosis not present

## 2020-08-31 DIAGNOSIS — H35373 Puckering of macula, bilateral: Secondary | ICD-10-CM | POA: Diagnosis not present

## 2020-08-31 DIAGNOSIS — H348322 Tributary (branch) retinal vein occlusion, left eye, stable: Secondary | ICD-10-CM | POA: Diagnosis not present

## 2020-10-04 ENCOUNTER — Other Ambulatory Visit: Payer: Self-pay | Admitting: Cardiology

## 2020-10-12 DIAGNOSIS — H348322 Tributary (branch) retinal vein occlusion, left eye, stable: Secondary | ICD-10-CM | POA: Diagnosis not present

## 2020-10-12 DIAGNOSIS — H59031 Cystoid macular edema following cataract surgery, right eye: Secondary | ICD-10-CM | POA: Diagnosis not present

## 2020-10-12 DIAGNOSIS — H35373 Puckering of macula, bilateral: Secondary | ICD-10-CM | POA: Diagnosis not present

## 2020-10-12 DIAGNOSIS — H353132 Nonexudative age-related macular degeneration, bilateral, intermediate dry stage: Secondary | ICD-10-CM | POA: Diagnosis not present

## 2020-11-25 DIAGNOSIS — E7800 Pure hypercholesterolemia, unspecified: Secondary | ICD-10-CM | POA: Diagnosis not present

## 2020-12-02 DIAGNOSIS — I5032 Chronic diastolic (congestive) heart failure: Secondary | ICD-10-CM | POA: Diagnosis not present

## 2020-12-02 DIAGNOSIS — I679 Cerebrovascular disease, unspecified: Secondary | ICD-10-CM | POA: Diagnosis not present

## 2020-12-02 DIAGNOSIS — N184 Chronic kidney disease, stage 4 (severe): Secondary | ICD-10-CM | POA: Diagnosis not present

## 2020-12-02 DIAGNOSIS — I82532 Chronic embolism and thrombosis of left popliteal vein: Secondary | ICD-10-CM | POA: Diagnosis not present

## 2020-12-02 DIAGNOSIS — Z1339 Encounter for screening examination for other mental health and behavioral disorders: Secondary | ICD-10-CM | POA: Diagnosis not present

## 2020-12-02 DIAGNOSIS — Z0000 Encounter for general adult medical examination without abnormal findings: Secondary | ICD-10-CM | POA: Diagnosis not present

## 2020-12-02 DIAGNOSIS — Z853 Personal history of malignant neoplasm of breast: Secondary | ICD-10-CM | POA: Diagnosis not present

## 2020-12-02 DIAGNOSIS — Z7901 Long term (current) use of anticoagulants: Secondary | ICD-10-CM | POA: Diagnosis not present

## 2020-12-02 DIAGNOSIS — M19042 Primary osteoarthritis, left hand: Secondary | ICD-10-CM | POA: Diagnosis not present

## 2020-12-02 DIAGNOSIS — Z1331 Encounter for screening for depression: Secondary | ICD-10-CM | POA: Diagnosis not present

## 2020-12-02 DIAGNOSIS — I130 Hypertensive heart and chronic kidney disease with heart failure and stage 1 through stage 4 chronic kidney disease, or unspecified chronic kidney disease: Secondary | ICD-10-CM | POA: Diagnosis not present

## 2020-12-02 DIAGNOSIS — I700 Atherosclerosis of aorta: Secondary | ICD-10-CM | POA: Diagnosis not present

## 2020-12-02 DIAGNOSIS — M19041 Primary osteoarthritis, right hand: Secondary | ICD-10-CM | POA: Diagnosis not present

## 2020-12-02 DIAGNOSIS — E7800 Pure hypercholesterolemia, unspecified: Secondary | ICD-10-CM | POA: Diagnosis not present

## 2020-12-07 DIAGNOSIS — S81812A Laceration without foreign body, left lower leg, initial encounter: Secondary | ICD-10-CM | POA: Diagnosis not present

## 2021-01-04 ENCOUNTER — Other Ambulatory Visit: Payer: Self-pay | Admitting: *Deleted

## 2021-01-04 MED ORDER — NEBIVOLOL HCL 2.5 MG PO TABS: ORAL_TABLET | ORAL | 2 refills | Status: AC

## 2021-02-23 DIAGNOSIS — H348322 Tributary (branch) retinal vein occlusion, left eye, stable: Secondary | ICD-10-CM | POA: Diagnosis not present

## 2021-02-23 DIAGNOSIS — H35373 Puckering of macula, bilateral: Secondary | ICD-10-CM | POA: Diagnosis not present

## 2021-02-23 DIAGNOSIS — H59031 Cystoid macular edema following cataract surgery, right eye: Secondary | ICD-10-CM | POA: Diagnosis not present

## 2021-02-23 DIAGNOSIS — H353132 Nonexudative age-related macular degeneration, bilateral, intermediate dry stage: Secondary | ICD-10-CM | POA: Diagnosis not present

## 2021-04-06 DIAGNOSIS — H353132 Nonexudative age-related macular degeneration, bilateral, intermediate dry stage: Secondary | ICD-10-CM | POA: Diagnosis not present

## 2021-04-06 DIAGNOSIS — H348322 Tributary (branch) retinal vein occlusion, left eye, stable: Secondary | ICD-10-CM | POA: Diagnosis not present

## 2021-04-06 DIAGNOSIS — H59031 Cystoid macular edema following cataract surgery, right eye: Secondary | ICD-10-CM | POA: Diagnosis not present

## 2021-04-06 DIAGNOSIS — H35373 Puckering of macula, bilateral: Secondary | ICD-10-CM | POA: Diagnosis not present

## 2021-05-05 DIAGNOSIS — Z1152 Encounter for screening for COVID-19: Secondary | ICD-10-CM | POA: Diagnosis not present

## 2021-05-05 DIAGNOSIS — R0981 Nasal congestion: Secondary | ICD-10-CM | POA: Diagnosis not present

## 2021-05-05 DIAGNOSIS — I130 Hypertensive heart and chronic kidney disease with heart failure and stage 1 through stage 4 chronic kidney disease, or unspecified chronic kidney disease: Secondary | ICD-10-CM | POA: Diagnosis not present

## 2021-05-05 DIAGNOSIS — R0602 Shortness of breath: Secondary | ICD-10-CM | POA: Diagnosis not present

## 2021-05-05 DIAGNOSIS — I5032 Chronic diastolic (congestive) heart failure: Secondary | ICD-10-CM | POA: Diagnosis not present

## 2021-05-05 DIAGNOSIS — R5383 Other fatigue: Secondary | ICD-10-CM | POA: Diagnosis not present

## 2021-05-05 DIAGNOSIS — J189 Pneumonia, unspecified organism: Secondary | ICD-10-CM | POA: Diagnosis not present

## 2021-05-13 ENCOUNTER — Emergency Department (HOSPITAL_COMMUNITY)
Admission: EM | Admit: 2021-05-13 | Discharge: 2021-05-14 | Disposition: A | Discharge status: Home / Self Care | Payer: Medicare Other | Attending: Student | Admitting: Student

## 2021-05-13 ENCOUNTER — Emergency Department (HOSPITAL_COMMUNITY): Payer: Medicare Other

## 2021-05-13 ENCOUNTER — Other Ambulatory Visit: Payer: Self-pay

## 2021-05-13 DIAGNOSIS — I110 Hypertensive heart disease with heart failure: Secondary | ICD-10-CM | POA: Insufficient documentation

## 2021-05-13 DIAGNOSIS — Z79899 Other long term (current) drug therapy: Secondary | ICD-10-CM | POA: Diagnosis not present

## 2021-05-13 DIAGNOSIS — Z7982 Long term (current) use of aspirin: Secondary | ICD-10-CM | POA: Insufficient documentation

## 2021-05-13 DIAGNOSIS — I10 Essential (primary) hypertension: Secondary | ICD-10-CM | POA: Diagnosis not present

## 2021-05-13 DIAGNOSIS — I6782 Cerebral ischemia: Secondary | ICD-10-CM | POA: Diagnosis not present

## 2021-05-13 DIAGNOSIS — R4182 Altered mental status, unspecified: Secondary | ICD-10-CM | POA: Diagnosis not present

## 2021-05-13 DIAGNOSIS — R4781 Slurred speech: Secondary | ICD-10-CM | POA: Diagnosis not present

## 2021-05-13 DIAGNOSIS — I509 Heart failure, unspecified: Secondary | ICD-10-CM | POA: Insufficient documentation

## 2021-05-13 DIAGNOSIS — F0390 Unspecified dementia without behavioral disturbance: Secondary | ICD-10-CM | POA: Diagnosis not present

## 2021-05-13 DIAGNOSIS — G319 Degenerative disease of nervous system, unspecified: Secondary | ICD-10-CM | POA: Diagnosis not present

## 2021-05-13 DIAGNOSIS — R4701 Aphasia: Secondary | ICD-10-CM | POA: Diagnosis present

## 2021-05-13 LAB — COMPREHENSIVE METABOLIC PANEL
ALT: 18 U/L (ref 0–44)
AST: 27 U/L (ref 15–41)
Albumin: 3.9 g/dL (ref 3.5–5.0)
Alkaline Phosphatase: 68 U/L (ref 38–126)
Anion gap: 9 (ref 5–15)
BUN: 39 mg/dL — ABNORMAL HIGH (ref 8–23)
CO2: 21 mmol/L — ABNORMAL LOW (ref 22–32)
Calcium: 9 mg/dL (ref 8.9–10.3)
Chloride: 109 mmol/L (ref 98–111)
Creatinine, Ser: 2.25 mg/dL — ABNORMAL HIGH (ref 0.44–1.00)
GFR, Estimated: 20 mL/min — ABNORMAL LOW (ref >60)
Glucose, Bld: 106 mg/dL — ABNORMAL HIGH (ref 70–99)
Potassium: 4.4 mmol/L (ref 3.5–5.1)
Sodium: 139 mmol/L (ref 135–145)
Total Bilirubin: 0.8 mg/dL (ref 0.3–1.2)
Total Protein: 6.9 g/dL (ref 6.5–8.1)

## 2021-05-13 LAB — DIFFERENTIAL
Abs Immature Granulocytes: 0.04 10*3/uL (ref 0.00–0.07)
Basophils Absolute: 0.1 10*3/uL (ref 0.0–0.1)
Basophils Relative: 1 %
Eosinophils Absolute: 1 10*3/uL — ABNORMAL HIGH (ref 0.0–0.5)
Eosinophils Relative: 10 %
Immature Granulocytes: 0 %
Lymphocytes Relative: 13 %
Lymphs Abs: 1.2 10*3/uL (ref 0.7–4.0)
Monocytes Absolute: 1.1 10*3/uL — ABNORMAL HIGH (ref 0.1–1.0)
Monocytes Relative: 11 %
Neutro Abs: 6.3 10*3/uL (ref 1.7–7.7)
Neutrophils Relative %: 65 %

## 2021-05-13 LAB — PROTIME-INR
INR: 1.1 (ref 0.8–1.2)
Prothrombin Time: 13.7 seconds (ref 11.4–15.2)

## 2021-05-13 LAB — I-STAT CHEM 8, ED
BUN: 41 mg/dL — ABNORMAL HIGH (ref 8–23)
Calcium, Ion: 1.14 mmol/L — ABNORMAL LOW (ref 1.15–1.40)
Chloride: 109 mmol/L (ref 98–111)
Creatinine, Ser: 2.1 mg/dL — ABNORMAL HIGH (ref 0.44–1.00)
Glucose, Bld: 104 mg/dL — ABNORMAL HIGH (ref 70–99)
HCT: 37 % (ref 36.0–46.0)
Hemoglobin: 12.6 g/dL (ref 12.0–15.0)
Potassium: 4.3 mmol/L (ref 3.5–5.1)
Sodium: 141 mmol/L (ref 135–145)
TCO2: 24 mmol/L (ref 22–32)

## 2021-05-13 LAB — CBC
HCT: 35.7 % — ABNORMAL LOW (ref 36.0–46.0)
Hemoglobin: 11.5 g/dL — ABNORMAL LOW (ref 12.0–15.0)
MCH: 30.7 pg (ref 26.0–34.0)
MCHC: 32.2 g/dL (ref 30.0–36.0)
MCV: 95.5 fL (ref 80.0–100.0)
Platelets: 152 10*3/uL (ref 150–400)
RBC: 3.74 MIL/uL — ABNORMAL LOW (ref 3.87–5.11)
RDW: 15.3 % (ref 11.5–15.5)
WBC: 9.8 10*3/uL (ref 4.0–10.5)
nRBC: 0 % (ref 0.0–0.2)

## 2021-05-13 LAB — APTT: aPTT: 32 seconds (ref 24–36)

## 2021-05-13 MED ORDER — SODIUM CHLORIDE 0.9% FLUSH: 3.0000 mL | Freq: Once | INTRAVENOUS | Status: DC

## 2021-05-13 NOTE — ED Triage Notes (Signed)
Pt arrives with family who states that she has had intermittent difficulty word finding x 2 days and then woke up this morning with slurred speech in addition to the difficulty word finding. Denies unilateral weakness or vision changes. Pt and family state that symptoms have much improved but were told to come here by PCP. On abx for pneumonia.

## 2021-05-13 NOTE — ED Provider Triage Note (Signed)
Emergency Medicine Provider Triage Evaluation Note  Rhonda Sharp , a 88 y.o. female  was evaluated in triage.  Pt complains of aphasia.  Daughter at bedside states that this morning when the patient woke up she was not talking.  She states that it took multiple hours for her to begin talking and her voice sounded different.  She states that they were on the way to the PCP when PCP told her to present to the emergency department.  By the time she got to the emergency department she was having more normal speech.  Patient still feels like her voice is "foggy."  She denies any other symptoms including headache or weakness..  Daughter states that she has not been formally diagnosed with dementia however she has been having hallucinations at home and sundowning.  Review of Systems  Positive: See above Negative:   Physical Exam  BP (!) 118/59    Pulse (!) 58    Temp 97.8 F (36.6 C) (Oral)    Resp 16    SpO2 98%  Gen:   Awake, no distress, alert and oriented Resp:  Normal effort  MSK:   Moves extremities without difficulty  Other:  No cranial nerve deficits, no pronator drift, strength in [place] all extremities.  Voice does sound somewhat muffled and is different for patient.  Medical Decision Making  Medically screening exam initiated at 4:54 PM.  Appropriate orders placed.  Tye Lac was informed that the remainder of the evaluation will be completed by another provider, this initial triage assessment does not replace that evaluation, and the importance of remaining in the ED until their evaluation is complete.     Mickie Hillier, PA-C 05/13/21 1656

## 2021-05-14 NOTE — Discharge Instructions (Addendum)
Your work-up today is quite reassuring.  As we discussed there are limitations to work-up such as the absence of being able to obtain a urine sample.  It is vitally important that you follow-up with your primary care doctor for an outpatient TIA (mini-stroke as we called it) work-up.  I will defer to them to talk to you more about what this entails.  If you experience any new or concerning symptoms such as numbness weakness slurred speech confusion or difficulty walking please return to the emergency room for reevaluation.

## 2021-05-14 NOTE — ED Provider Notes (Addendum)
[facility] EMERGENCY DEPARTMENT Provider Note   CSN: [id number] Arrival date & time: 05/13/21  1434     History  Chief Complaint  Patient presents with   Aphasia    Rhonda Sharp is a 88 y.o. female.  HPI Patient is a 88 year old female with past medical history significant for encephalopathy, HTN, stroke, cytopenia, AKI, pneumonia, DVT, CHF, dementia  She is presented emergency room with her daughter today who brings her to the emergency room for slurred speech.  She has been in the waiting room for 17 hours prior to my evaluation the patient.  Apparently according to daughter she woke up this morning and had somewhat slurred speech.  Daughter states that patient has dementia but seem to be at her normal baseline cognitive ability and was able to answer questions but did have more of a mumbling and slurring of her speech.  No numbness or weakness in any extremity.  No visual symptoms such as blurred vision or double vision.     Home Medications Prior to Admission medications   Medication Sig Start Date End Date Taking? Authorizing Provider  acetaminophen (TYLENOL) 500 MG tablet Take 1,000 mg by mouth every 6 (six) hours as needed for mild pain.   Yes [provider]  allopurinol (ZYLOPRIM) 100 MG tablet Take 100 mg by mouth daily. 05/21/20  Yes [provider]  amLODipine (NORVASC) 5 MG tablet TAKE 1 TABLET(5 MG) BY MOUTH DAILY Patient taking differently: Take 5 mg by mouth daily. 06/01/20  Yes Jerline Pain, MD  aspirin EC 81 MG tablet Take 1 tablet (81 mg total) by mouth daily. 04/12/19  Yes Jerline Pain, MD  benzonatate (TESSALON) 100 MG capsule Take 100 mg by mouth 3 (three) times daily as needed for cough. 05/05/21  Yes [provider]  fexofenadine (ALLEGRA) 180 MG tablet Take 180 mg by mouth daily.   Yes [provider]  furosemide (LASIX) 20 MG tablet TAKE 1 TABLET(20 MG) BY MOUTH DAILY Patient taking  differently: Take 20 mg by mouth daily. 07/07/20  Yes Jerline Pain, MD  hydrALAZINE (APRESOLINE) 50 MG tablet Take 1 tablet (50 mg total) by mouth 2 (two) times a day. 11/16/18 05/14/21 Yes Arrien, Jimmy Picket, MD  ibuprofen (ADVIL) 200 MG tablet Take 400 mg by mouth every 6 (six) hours as needed for headache or moderate pain.   Yes [provider]  loperamide (IMODIUM A-D) 2 MG tablet Take 2 mg by mouth 4 (four) times daily as needed for diarrhea or loose stools.   Yes [provider]  nebivolol (BYSTOLIC) 2.5 MG tablet TAKE 1 TABLET(2.5 MG) BY MOUTH DAILY Patient taking differently: Take 2.5 mg by mouth daily. 01/04/21  Yes Jerline Pain, MD  pravastatin (PRAVACHOL) 20 MG tablet Take 1 tablet (20 mg total) by mouth daily at 6 PM. Patient taking differently: Take 20 mg by mouth at bedtime. 05/25/18  Yes [place]field, Carley D, DO  azithromycin (ZITHROMAX) 250 MG tablet Take 250-500 mg by mouth See admin instructions. 2 tabs day 1 1 tab daily days 2-5 Patient not taking: Reported on 05/14/2021 05/05/21   [provider]  cefdinir (OMNICEF) 300 MG capsule Take 300 mg by mouth See admin instructions. Bid x 7 days Patient not taking: Reported on 05/14/2021 05/06/21   [provider]  ketorolac (ACULAR) 0.5 % ophthalmic solution Place 1 drop into the right eye 3 (three) times daily. 11/20/19   [provider]  prednisoLONE acetate (  PRED FORTE) 1 % ophthalmic suspension Place 1 drop into the right eye every 4 (four) hours.  Patient not taking: Reported on 05/14/2021 11/19/19   [provider]      Allergies    Sulfa antibiotics    Review of Systems   Review of Systems  Constitutional:  Negative for chills and fever.  HENT:  Negative for congestion.   Eyes:  Negative for pain.  Respiratory:  Negative for cough and shortness of breath.   Cardiovascular:  Negative for chest pain and leg swelling.  Gastrointestinal:  Negative for abdominal pain, diarrhea,  nausea and vomiting.  Genitourinary:  Negative for dysuria.  Musculoskeletal:  Negative for myalgias.  Skin:  Negative for rash.  Neurological:  Positive for speech difficulty. Negative for dizziness and headaches.       Slurred speech   Physical Exam Updated Vital Signs BP 135/87    Pulse (!) 59    Temp 97.6 F (36.4 C)    Resp 17    SpO2 98%  Physical Exam Vitals and nursing note reviewed.  Constitutional:      General: She is not in acute distress.    Comments: Pleasantly demented 88 year old female is alert and oriented able to answer questions and follow commands.  Clear speech on my exam.   HENT:     Head: Normocephalic and atraumatic.     Nose: Nose normal.     Mouth/Throat:     Mouth: Mucous membranes are moist.  Eyes:     General: No scleral icterus. Cardiovascular:     Rate and Rhythm: Normal rate and regular rhythm.     Pulses: Normal pulses.     Heart sounds: Normal heart sounds.  Pulmonary:     Effort: Pulmonary effort is normal. No respiratory distress.     Breath sounds: No wheezing.  Abdominal:     Palpations: Abdomen is soft.     Tenderness: There is no abdominal tenderness. There is no guarding or rebound.  Musculoskeletal:     Cervical back: Normal range of motion.     Right lower leg: No edema.     Left lower leg: No edema.  Skin:    General: Skin is warm and dry.     Capillary Refill: Capillary refill takes less than 2 seconds.  Neurological:     Mental Status: She is alert. Mental status is at baseline.     Comments: Alert and oriented to self, place, time and event.   Speech is fluent, clear without dysarthria or dysphasia.   Strength 5/5 in upper/lower extremities   Sensation intact in upper/lower extremities   Normal finger-to-nose and feet tapping.  CN I not tested  CN II grossly intact visual fields bilaterally. Did not visualize posterior eye.  CN III, IV, VI PERRLA and EOMs intact bilaterally  CN V Intact sensation to sharp and light  touch to the face  CN VII facial movements symmetric  CN VIII not tested  CN IX, X no uvula deviation, symmetric rise of soft palate  CN XI 5/5 SCM and trapezius strength bilaterally  CN XII Midline tongue protrusion, symmetric L/R movements    Psychiatric:        Mood and Affect: Mood normal.        Behavior: Behavior normal.    ED Results / Procedures / Treatments   Labs (all labs ordered are listed, but only abnormal results are displayed) Labs Reviewed  CBC - Abnormal; Notable for the following  components:      Result Value   RBC 3.74 (*)    Hemoglobin 11.5 (*)    HCT 35.7 (*)    All other components within normal limits  DIFFERENTIAL - Abnormal; Notable for the following components:   Monocytes Absolute 1.1 (*)    Eosinophils Absolute 1.0 (*)    All other components within normal limits  COMPREHENSIVE METABOLIC PANEL - Abnormal; Notable for the following components:   CO2 21 (*)    Glucose, Bld 106 (*)    BUN 39 (*)    Creatinine, Ser 2.25 (*)    GFR, Estimated 20 (*)    All other components within normal limits  I-STAT CHEM 8, ED - Abnormal; Notable for the following components:   BUN 41 (*)    Creatinine, Ser 2.10 (*)    Glucose, Bld 104 (*)    Calcium, Ion 1.14 (*)    All other components within normal limits  PROTIME-INR  APTT    EKG EKG Interpretation  Date/Time:  Thursday May 13 2021 16:38:43 EST Ventricular Rate:  60 PR Interval:    QRS Duration: 68 QT Interval:  446 QTC Calculation: 446 R Axis:   65 Text Interpretation: Junctional rhythm Abnormal ECG When compared with ECG of 12-Nov-2018 05:33, No significant change since last tracing Confirmed by Aletta Edouard [phone]) on 05/14/2021 11:49:47 AM  Radiology CT HEAD WO CONTRAST  Result Date: 05/13/2021 CLINICAL DATA:  Concern for stroke, change in mental status, slurred speech EXAM: CT HEAD WITHOUT CONTRAST TECHNIQUE: Contiguous axial images were obtained from the base of the skull through the  vertex without intravenous contrast. COMPARISON:  05/22/2018 FINDINGS: Brain: Stable atrophy and chronic white matter microvascular ischemic changes throughout both cerebral hemispheres. No acute intracranial hemorrhage, mass lesion, acute infarction, midline shift, herniation, hydrocephalus or extra-axial fluid collection. No focal mass effect or edema. Cisterns are patent. Cerebellar atrophy as well. Vascular: No hyperdense vessel or unexpected calcification. Skull: Normal. Negative for fracture or focal lesion. Sinuses/Orbits: No acute finding. Other: None. IMPRESSION: Stable atrophy pattern and chronic white matter microvascular ischemic changes. No acute intracranial abnormality by noncontrast CT or significant interval change. Electronically Signed   By: Jerilynn Mages.  Shick M.D.   On: 05/13/2021 15:58   MR Brain Wo Contrast (neuro protocol)  Result Date: 05/13/2021 CLINICAL DATA:  Initial evaluation for acute TIA. EXAM: MRI HEAD WITHOUT CONTRAST TECHNIQUE: Multiplanar, multiecho pulse sequences of the brain and surrounding structures were obtained without intravenous contrast. COMPARISON:  Prior CT from earlier the same day. FINDINGS: Brain: Age-related cerebral atrophy with moderate chronic microvascular ischemic disease. Few scattered remote lacunar infarcts present about the thalami, basal ganglia, and pons. No abnormal foci of restricted diffusion to suggest acute or subacute ischemia. Gray-white matter differentiation maintained. No encephalomalacia to suggest chronic cortical infarction. No acute or chronic intracranial hemorrhage. No mass lesion, midline shift or mass effect no hydrocephalus or extra-axial fluid collection. Pituitary gland suprasellar region normal. Midline structures intact. Vascular: Major intracranial vascular flow voids are maintained. Skull and upper cervical spine: Craniocervical junction within normal limits. Bone marrow signal intensity normal. No scalp soft tissue abnormality.  Sinuses/Orbits: Prior ocular lens replacement on the right. Globes and orbital soft tissues demonstrate no acute finding. Paranasal sinuses are largely clear. No significant mastoid effusion. Other: None. IMPRESSION: 1. No acute intracranial abnormality. 2. Age-related cerebral atrophy with moderate chronic microvascular ischemic disease. Electronically Signed   By: Jeannine Boga M.D.   On: 05/13/2021 22:19  Procedures Procedures    Medications Ordered in ED Medications - No data to display   ED Course/ Medical Decision Making/ A&P                           Medical Decision Making  Pt is 88 year old female who I evaluated and triage given that her and her daughter are planning on leaving without being seen.  The emergency room waiting room for 17 hours.  It seems that she is completely asymptomatic at this time.  I evaluated patient in triage room given the extended waiting time.  Patient woke up this morning with difficulty speaking seems last known normal was last night around 9-10pm. She was outside of stroke window on presentation and has had resolving symptoms since presenting to the emergency room.  On further questioning it seems that patient does not have a diagnosis of like she has worsening pneumonia and some hallucinations at home and sundowning seems that daughter feels safe for comfortable taking patient home however.  Patient has had a very appropriate work-up so far in the emergency room.  CBC with anemia without leukocytosis.  CMP with creatinine at baseline.  Coags within normal limits.  CT head unremarkable MRI shows  IMPRESSION:  1. No acute intracranial abnormality.  2. Age-related cerebral atrophy with moderate chronic microvascular  ischemic disease.     Personally reviewed all labs and imaging.  EKG without any significant changes from prior.  Nonischemic.  Lengthy shared decision-making conversation with patient and daughter.  Given her symptoms  she certainly could be admitted for TIA work-up however they are requesting to be discharged home.  They state that they can see their primary care doctor later today.  I discussed with them that TIAs can be sentinel events for large strokes.  For any new or concerning symptoms they should immediately return the emergency department.  They are understanding of this.  Discussed with Dr. Matilde Sprang who is in agreement with my plan.  Will discharge home at this time.  Understands the strict return precautions given.   Final Clinical Impression(s) / ED Diagnoses Final diagnoses:  Slurred speech    Rx / DC Orders ED Discharge Orders     None         Tedd Sias, [place] 05/14/21 1440    Pati Gallo [place], [place] 05/14/21 1440    Teressa Lower, MD 05/16/21 1556

## 2021-05-18 DIAGNOSIS — J189 Pneumonia, unspecified organism: Secondary | ICD-10-CM | POA: Diagnosis not present

## 2021-05-18 DIAGNOSIS — R0602 Shortness of breath: Secondary | ICD-10-CM | POA: Diagnosis not present

## 2021-05-18 DIAGNOSIS — R410 Disorientation, unspecified: Secondary | ICD-10-CM | POA: Diagnosis not present

## 2021-05-18 DIAGNOSIS — I130 Hypertensive heart and chronic kidney disease with heart failure and stage 1 through stage 4 chronic kidney disease, or unspecified chronic kidney disease: Secondary | ICD-10-CM | POA: Diagnosis not present

## 2021-05-18 DIAGNOSIS — I5032 Chronic diastolic (congestive) heart failure: Secondary | ICD-10-CM | POA: Diagnosis not present

## 2021-05-28 MED ORDER — KETAMINE HCL 50 MG/5ML IJ SOSY
PREFILLED_SYRINGE | INTRAMUSCULAR | Status: AC
  Filled 2021-05-28: qty 5

## 2021-05-30 ENCOUNTER — Encounter (HOSPITAL_BASED_OUTPATIENT_CLINIC_OR_DEPARTMENT_OTHER): Payer: Self-pay | Admitting: Emergency Medicine

## 2021-05-30 ENCOUNTER — Emergency Department (HOSPITAL_BASED_OUTPATIENT_CLINIC_OR_DEPARTMENT_OTHER): Payer: Medicare Other

## 2021-05-30 ENCOUNTER — Other Ambulatory Visit: Payer: Self-pay

## 2021-05-30 ENCOUNTER — Inpatient Hospital Stay (HOSPITAL_BASED_OUTPATIENT_CLINIC_OR_DEPARTMENT_OTHER)
Admission: EM | Admit: 2021-05-30 | Discharge: 2021-06-09 | DRG: 177 | Disposition: E | Payer: Medicare Other | Attending: Internal Medicine | Admitting: Internal Medicine

## 2021-05-30 DIAGNOSIS — M542 Cervicalgia: Secondary | ICD-10-CM | POA: Diagnosis not present

## 2021-05-30 DIAGNOSIS — R0602 Shortness of breath: Secondary | ICD-10-CM | POA: Diagnosis not present

## 2021-05-30 DIAGNOSIS — Z8673 Personal history of transient ischemic attack (TIA), and cerebral infarction without residual deficits: Secondary | ICD-10-CM

## 2021-05-30 DIAGNOSIS — N184 Chronic kidney disease, stage 4 (severe): Secondary | ICD-10-CM | POA: Diagnosis present

## 2021-05-30 DIAGNOSIS — I10 Essential (primary) hypertension: Secondary | ICD-10-CM | POA: Diagnosis not present

## 2021-05-30 DIAGNOSIS — Z79899 Other long term (current) drug therapy: Secondary | ICD-10-CM | POA: Diagnosis not present

## 2021-05-30 DIAGNOSIS — E860 Dehydration: Secondary | ICD-10-CM | POA: Diagnosis not present

## 2021-05-30 DIAGNOSIS — N183 Chronic kidney disease, stage 3 unspecified: Secondary | ICD-10-CM | POA: Diagnosis present

## 2021-05-30 DIAGNOSIS — R0603 Acute respiratory distress: Secondary | ICD-10-CM | POA: Diagnosis not present

## 2021-05-30 DIAGNOSIS — Z87891 Personal history of nicotine dependence: Secondary | ICD-10-CM

## 2021-05-30 DIAGNOSIS — I160 Hypertensive urgency: Secondary | ICD-10-CM | POA: Diagnosis present

## 2021-05-30 DIAGNOSIS — R059 Cough, unspecified: Secondary | ICD-10-CM | POA: Diagnosis not present

## 2021-05-30 DIAGNOSIS — I82401 Acute embolism and thrombosis of unspecified deep veins of right lower extremity: Secondary | ICD-10-CM | POA: Diagnosis not present

## 2021-05-30 DIAGNOSIS — M109 Gout, unspecified: Secondary | ICD-10-CM | POA: Diagnosis present

## 2021-05-30 DIAGNOSIS — Z66 Do not resuscitate: Secondary | ICD-10-CM | POA: Diagnosis present

## 2021-05-30 DIAGNOSIS — R410 Disorientation, unspecified: Secondary | ICD-10-CM | POA: Diagnosis not present

## 2021-05-30 DIAGNOSIS — Z86718 Personal history of other venous thrombosis and embolism: Secondary | ICD-10-CM

## 2021-05-30 DIAGNOSIS — U071 COVID-19: Secondary | ICD-10-CM | POA: Diagnosis not present

## 2021-05-30 DIAGNOSIS — I5033 Acute on chronic diastolic (congestive) heart failure: Secondary | ICD-10-CM | POA: Diagnosis present

## 2021-05-30 DIAGNOSIS — F0390 Unspecified dementia without behavioral disturbance: Secondary | ICD-10-CM | POA: Diagnosis present

## 2021-05-30 DIAGNOSIS — Z7189 Other specified counseling: Secondary | ICD-10-CM | POA: Diagnosis not present

## 2021-05-30 DIAGNOSIS — Z853 Personal history of malignant neoplasm of breast: Secondary | ICD-10-CM | POA: Diagnosis not present

## 2021-05-30 DIAGNOSIS — R4182 Altered mental status, unspecified: Secondary | ICD-10-CM | POA: Diagnosis not present

## 2021-05-30 DIAGNOSIS — N179 Acute kidney failure, unspecified: Secondary | ICD-10-CM | POA: Diagnosis not present

## 2021-05-30 DIAGNOSIS — Z515 Encounter for palliative care: Secondary | ICD-10-CM | POA: Diagnosis not present

## 2021-05-30 DIAGNOSIS — J810 Acute pulmonary edema: Secondary | ICD-10-CM | POA: Diagnosis not present

## 2021-05-30 DIAGNOSIS — I130 Hypertensive heart and chronic kidney disease with heart failure and stage 1 through stage 4 chronic kidney disease, or unspecified chronic kidney disease: Secondary | ICD-10-CM | POA: Diagnosis present

## 2021-05-30 DIAGNOSIS — N281 Cyst of kidney, acquired: Secondary | ICD-10-CM | POA: Diagnosis not present

## 2021-05-30 DIAGNOSIS — R0902 Hypoxemia: Secondary | ICD-10-CM | POA: Diagnosis not present

## 2021-05-30 DIAGNOSIS — J1282 Pneumonia due to coronavirus disease 2019: Secondary | ICD-10-CM | POA: Diagnosis present

## 2021-05-30 DIAGNOSIS — E870 Hyperosmolality and hypernatremia: Secondary | ICD-10-CM | POA: Diagnosis not present

## 2021-05-30 DIAGNOSIS — D649 Anemia, unspecified: Secondary | ICD-10-CM | POA: Diagnosis present

## 2021-05-30 DIAGNOSIS — J9601 Acute respiratory failure with hypoxia: Secondary | ICD-10-CM | POA: Diagnosis not present

## 2021-05-30 DIAGNOSIS — I517 Cardiomegaly: Secondary | ICD-10-CM | POA: Diagnosis not present

## 2021-05-30 DIAGNOSIS — Z7982 Long term (current) use of aspirin: Secondary | ICD-10-CM | POA: Diagnosis not present

## 2021-05-30 LAB — BRAIN NATRIURETIC PEPTIDE: B Natriuretic Peptide: 1198.2 pg/mL — ABNORMAL HIGH (ref 0.0–100.0)

## 2021-05-30 LAB — BASIC METABOLIC PANEL
Anion gap: 12 (ref 5–15)
BUN: 45 mg/dL — ABNORMAL HIGH (ref 8–23)
CO2: 23 mmol/L (ref 22–32)
Calcium: 9.6 mg/dL (ref 8.9–10.3)
Chloride: 107 mmol/L (ref 98–111)
Creatinine, Ser: 1.71 mg/dL — ABNORMAL HIGH (ref 0.44–1.00)
GFR, Estimated: 28 mL/min — ABNORMAL LOW (ref >60)
Glucose, Bld: 152 mg/dL — ABNORMAL HIGH (ref 70–99)
Potassium: 4.3 mmol/L (ref 3.5–5.1)
Sodium: 142 mmol/L (ref 135–145)

## 2021-05-30 LAB — I-STAT ARTERIAL BLOOD GAS, ED
Acid-base deficit: 1 mmol/L (ref 0.0–2.0)
Bicarbonate: 23.4 mmol/L (ref 20.0–28.0)
Calcium, Ion: 1.26 mmol/L (ref 1.15–1.40)
HCT: 33 % — ABNORMAL LOW (ref 36.0–46.0)
Hemoglobin: 11.2 g/dL — ABNORMAL LOW (ref 12.0–15.0)
O2 Saturation: 96 %
Patient temperature: 37
Potassium: 4.1 mmol/L (ref 3.5–5.1)
Sodium: 143 mmol/L (ref 135–145)
TCO2: 25 mmol/L (ref 22–32)
pCO2 arterial: 37.1 mmHg (ref 32.0–48.0)
pH, Arterial: 7.408 (ref 7.350–7.450)
pO2, Arterial: 82 mmHg — ABNORMAL LOW (ref 83.0–108.0)

## 2021-05-30 LAB — CBC
HCT: 32.2 % — ABNORMAL LOW (ref 36.0–46.0)
HCT: 33.1 % — ABNORMAL LOW (ref 36.0–46.0)
Hemoglobin: 10.1 g/dL — ABNORMAL LOW (ref 12.0–15.0)
Hemoglobin: 10.6 g/dL — ABNORMAL LOW (ref 12.0–15.0)
MCH: 29.9 pg (ref 26.0–34.0)
MCH: 30.4 pg (ref 26.0–34.0)
MCHC: 31.4 g/dL (ref 30.0–36.0)
MCHC: 32 g/dL (ref 30.0–36.0)
MCV: 95.3 fL (ref 80.0–100.0)
MCV: 94.8 fL (ref 80.0–100.0)
Platelets: 130 10*3/uL — ABNORMAL LOW (ref 150–400)
Platelets: 129 10*3/uL — ABNORMAL LOW (ref 150–400)
RBC: 3.38 MIL/uL — ABNORMAL LOW (ref 3.87–5.11)
RBC: 3.49 MIL/uL — ABNORMAL LOW (ref 3.87–5.11)
RDW: 15.5 % (ref 11.5–15.5)
RDW: 15.5 % (ref 11.5–15.5)
WBC: 13.2 10*3/uL — ABNORMAL HIGH (ref 4.0–10.5)
WBC: 8 10*3/uL (ref 4.0–10.5)
nRBC: 0 % (ref 0.0–0.2)
nRBC: 0 % (ref 0.0–0.2)

## 2021-05-30 LAB — C-REACTIVE PROTEIN: CRP: 3 mg/dL — ABNORMAL HIGH (ref <1.0)

## 2021-05-30 LAB — RESP PANEL BY RT-PCR (FLU A&B, COVID) ARPGX2
Influenza A by PCR: NEGATIVE
Influenza B by PCR: NEGATIVE
SARS Coronavirus 2 by RT PCR: POSITIVE — AB

## 2021-05-30 LAB — TROPONIN I (HIGH SENSITIVITY)
Troponin I (High Sensitivity): 26 ng/L — ABNORMAL HIGH (ref <18)
Troponin I (High Sensitivity): 29 ng/L — ABNORMAL HIGH (ref <18)
Troponin I (High Sensitivity): 29 ng/L — ABNORMAL HIGH (ref <18)

## 2021-05-30 LAB — CREATININE, SERUM
Creatinine, Ser: 1.65 mg/dL — ABNORMAL HIGH (ref 0.44–1.00)
GFR, Estimated: 29 mL/min — ABNORMAL LOW (ref >60)

## 2021-05-30 LAB — D-DIMER, QUANTITATIVE: D-Dimer, Quant: 2.39 ug/mL-FEU — ABNORMAL HIGH (ref 0.00–0.50)

## 2021-05-30 LAB — SEDIMENTATION RATE: Sed Rate: 40 mm/hr — ABNORMAL HIGH (ref 0–22)

## 2021-05-30 MED ORDER — SODIUM CHLORIDE 0.9 % IV SOLN
100.0000 mg | Freq: Every day | INTRAVENOUS | Status: AC
  Administered 2021-05-31 – 2021-06-03 (×4): 100 mg via INTRAVENOUS
  Filled 2021-05-30 (×4): qty 20

## 2021-05-30 MED ORDER — BENZONATATE 100 MG PO CAPS: 100.0000 mg | ORAL_CAPSULE | Freq: Three times a day (TID) | ORAL | Status: DC | PRN

## 2021-05-30 MED ORDER — NEBIVOLOL HCL 2.5 MG PO TABS
2.5000 mg | ORAL_TABLET | Freq: Every day | ORAL | Status: DC
  Administered 2021-05-31 – 2021-06-01 (×2): 2.5 mg via ORAL
  Filled 2021-05-30 (×5): qty 1

## 2021-05-30 MED ORDER — SODIUM CHLORIDE 0.9 % IV SOLN
500.0000 mg | Freq: Once | INTRAVENOUS | Status: AC
  Administered 2021-05-30: 500 mg via INTRAVENOUS
  Filled 2021-05-30: qty 5

## 2021-05-30 MED ORDER — ASPIRIN EC 81 MG PO TBEC
81.0000 mg | DELAYED_RELEASE_TABLET | Freq: Every day | ORAL | Status: DC
  Administered 2021-05-31: 81 mg via ORAL
  Filled 2021-05-30 (×3): qty 1

## 2021-05-30 MED ORDER — ACETAMINOPHEN 650 MG RE SUPP: 650.0000 mg | Freq: Four times a day (QID) | RECTAL | Status: DC | PRN

## 2021-05-30 MED ORDER — HYDRALAZINE HCL 50 MG PO TABS
50.0000 mg | ORAL_TABLET | Freq: Once | ORAL | Status: AC
  Administered 2021-05-30: 50 mg via ORAL
  Filled 2021-05-30: qty 1

## 2021-05-30 MED ORDER — ALLOPURINOL 100 MG PO TABS
100.0000 mg | ORAL_TABLET | Freq: Every day | ORAL | Status: DC
  Administered 2021-05-31 – 2021-06-01 (×2): 100 mg via ORAL
  Filled 2021-05-30 (×3): qty 1

## 2021-05-30 MED ORDER — SODIUM CHLORIDE 0.9 % IV SOLN
100.0000 mg | Freq: Once | INTRAVENOUS | Status: AC
  Administered 2021-05-30: 100 mg via INTRAVENOUS

## 2021-05-30 MED ORDER — CHLORHEXIDINE GLUCONATE 0.12 % MT SOLN
15.0000 mL | Freq: Two times a day (BID) | OROMUCOSAL | Status: DC
  Administered 2021-05-30: 15 mL via OROMUCOSAL
  Filled 2021-05-30: qty 15

## 2021-05-30 MED ORDER — FUROSEMIDE 10 MG/ML IJ SOLN
40.0000 mg | Freq: Once | INTRAMUSCULAR | Status: AC
Start: 2021-05-30 — End: 2021-05-30
  Administered 2021-05-30: 40 mg via INTRAVENOUS
  Filled 2021-05-30: qty 4

## 2021-05-30 MED ORDER — LORATADINE 10 MG PO TABS
10.0000 mg | ORAL_TABLET | Freq: Every day | ORAL | Status: DC
  Administered 2021-05-31 – 2021-06-01 (×2): 10 mg via ORAL
  Filled 2021-05-30 (×3): qty 1

## 2021-05-30 MED ORDER — FUROSEMIDE 10 MG/ML IJ SOLN
20.0000 mg | Freq: Two times a day (BID) | INTRAMUSCULAR | Status: DC
Start: 2021-05-31 — End: 2021-06-02
  Administered 2021-05-31 – 2021-06-02 (×5): 20 mg via INTRAVENOUS
  Filled 2021-05-30 (×5): qty 2

## 2021-05-30 MED ORDER — HYDRALAZINE HCL 50 MG PO TABS
50.0000 mg | ORAL_TABLET | Freq: Two times a day (BID) | ORAL | Status: DC
  Administered 2021-05-31 – 2021-06-01 (×4): 50 mg via ORAL
  Filled 2021-05-30 (×4): qty 1

## 2021-05-30 MED ORDER — AMLODIPINE BESYLATE 5 MG PO TABS
5.0000 mg | ORAL_TABLET | Freq: Every day | ORAL | Status: DC
  Administered 2021-05-31 – 2021-06-01 (×2): 5 mg via ORAL
  Filled 2021-05-30 (×2): qty 1

## 2021-05-30 MED ORDER — GUAIFENESIN ER 600 MG PO TB12: 600.0000 mg | ORAL_TABLET | Freq: Two times a day (BID) | ORAL | Status: DC | PRN

## 2021-05-30 MED ORDER — PRAVASTATIN SODIUM 20 MG PO TABS
20.0000 mg | ORAL_TABLET | Freq: Every day | ORAL | Status: DC
  Administered 2021-05-30 – 2021-06-01 (×3): 20 mg via ORAL
  Filled 2021-05-30 (×3): qty 1

## 2021-05-30 MED ORDER — ORAL CARE MOUTH RINSE: 15.0000 mL | Freq: Two times a day (BID) | OROMUCOSAL | Status: DC

## 2021-05-30 MED ORDER — IPRATROPIUM-ALBUTEROL 0.5-2.5 (3) MG/3ML IN SOLN
3.0000 mL | Freq: Once | RESPIRATORY_TRACT | Status: AC
  Administered 2021-05-30: 3 mL via RESPIRATORY_TRACT
  Filled 2021-05-30: qty 3

## 2021-05-30 MED ORDER — CHLORHEXIDINE GLUCONATE CLOTH 2 % EX PADS
6.0000 | MEDICATED_PAD | Freq: Every day | CUTANEOUS | Status: DC
  Administered 2021-05-30 – 2021-06-02 (×3): 6 via TOPICAL

## 2021-05-30 MED ORDER — ACETAMINOPHEN 325 MG PO TABS: 650.0000 mg | ORAL_TABLET | Freq: Four times a day (QID) | ORAL | Status: DC | PRN

## 2021-05-30 MED ORDER — DEXAMETHASONE SODIUM PHOSPHATE 10 MG/ML IJ SOLN
6.0000 mg | INTRAMUSCULAR | Status: DC
  Administered 2021-05-31 – 2021-06-04 (×5): 6 mg via INTRAVENOUS
  Filled 2021-05-30 (×5): qty 1

## 2021-05-30 MED ORDER — DEXAMETHASONE SODIUM PHOSPHATE 10 MG/ML IJ SOLN
10.0000 mg | Freq: Once | INTRAMUSCULAR | Status: AC
  Administered 2021-05-30: 10 mg via INTRAVENOUS
  Filled 2021-05-30: qty 1

## 2021-05-30 MED ORDER — SODIUM CHLORIDE 0.9 % IV SOLN
1.0000 g | Freq: Once | INTRAVENOUS | Status: AC
  Administered 2021-05-30: 1 g via INTRAVENOUS
  Filled 2021-05-30: qty 10

## 2021-05-30 MED ORDER — ALBUTEROL SULFATE HFA 108 (90 BASE) MCG/ACT IN AERS: 2.0000 | INHALATION_SPRAY | RESPIRATORY_TRACT | Status: DC | PRN

## 2021-05-30 MED ORDER — HYDRALAZINE HCL 20 MG/ML IJ SOLN
10.0000 mg | INTRAMUSCULAR | Status: DC | PRN
  Administered 2021-05-31: 10 mg via INTRAVENOUS
  Filled 2021-05-30 (×2): qty 1

## 2021-05-30 MED ORDER — ENOXAPARIN SODIUM 30 MG/0.3ML IJ SOSY
30.0000 mg | PREFILLED_SYRINGE | INTRAMUSCULAR | Status: DC
  Administered 2021-05-31 – 2021-06-04 (×5): 30 mg via SUBCUTANEOUS
  Filled 2021-05-30 (×5): qty 0.3

## 2021-05-30 NOTE — Progress Notes (Addendum)
Report given to [employer], Charlyne Mom at [phone].

## 2021-05-30 NOTE — Progress Notes (Addendum)
Ms. Giza was placed on BiPap 8/4 r-16 28% at 1400.  Tolerating well.  She is resting and her HR and RR have decreased to a normal level. O2 sat=97%

## 2021-05-30 NOTE — H&P (Addendum)
History and Physical    Rhonda Sharp LSL:[id number] DOB: [birth year] DOA: 05/20/2021  PCP: [name], DO  Patient coming from: Home.  Chief Complaint: Shortness of breath.  HPI: Rhonda Sharp is a 88 y.o. female with history of chronic diastolic CHF prior DVT off Eliquis at this time on aspirin, breast cancer in remission, hypertension chronic kidney disease has been experiencing increasing shortness of breath for the last 3 to 4 days.  Has been also having some productive cough denies any chest pain.  Patient states she also noticed lower extremity edema.  ED Course: In the ER patient was initially hypoxic and had to be placed on BiPAP.  Chest x-ray shows congestion.  Was given Lasix.  Patient's COVID test came back positive was placed on remdesivir and also antibiotics for community-acquired pneumonia.  After Lasix patient was able to be weaned off BiPAP.  Patient blood pressure is also markedly elevated.  Labs show elevated BNP and D-dimer.  Review of Systems: As per HPI, rest all negative.   Past Medical History:  Diagnosis Date   Breast cancer, left breast ([place]) ~ 2002   "had radiation"   Glaucoma    History of gout    Hypertension    Migraine    "used to get them; nothing recently" (05/22/2018)   Retinal hemorrhage    Stroke [facility]) 2001   daughter denies residual on 05/22/2018    Past Surgical History:  Procedure Laterality Date   APPENDECTOMY     AXILLARY LYMPH NODE DISSECTION Left ~ 2002   BREAST BIOPSY Left ~ 2002   EYE SURGERY Left 05/2018   "laser OR for bleed"   TUBAL LIGATION       reports that she has quit smoking. Her smoking use included cigarettes. She has a 20.00 pack-year smoking history. She has never used smokeless tobacco. She reports current alcohol use of about 6.0 standard drinks per week. She reports that she does not use drugs.  Allergies  Allergen Reactions   Sulfa Antibiotics Other (See Comments)    Unknown reaction to pt     Family History  Problem Relation Age of Onset   ALS Sister     Prior to Admission medications   Medication Sig Start Date End Date Taking? Authorizing Provider  acetaminophen (TYLENOL) 500 MG tablet Take 1,000 mg by mouth every 6 (six) hours as needed for mild pain.   Yes [provider]  allopurinol (ZYLOPRIM) 100 MG tablet Take 100 mg by mouth daily. 05/21/20  Yes [provider]  amLODipine (NORVASC) 5 MG tablet TAKE 1 TABLET(5 MG) BY MOUTH DAILY Patient taking differently: Take 5 mg by mouth daily. 06/01/20  Yes Jerline Pain, MD  aspirin EC 81 MG tablet Take 1 tablet (81 mg total) by mouth daily. 04/12/19  Yes Jerline Pain, MD  benzonatate (TESSALON) 100 MG capsule Take 100 mg by mouth 3 (three) times daily as needed for cough. 05/05/21  Yes [provider]  fexofenadine (ALLEGRA) 180 MG tablet Take 180 mg by mouth daily.   Yes [provider]  furosemide (LASIX) 20 MG tablet TAKE 1 TABLET(20 MG) BY MOUTH DAILY Patient taking differently: Take 20 mg by mouth daily. 07/07/20  Yes Jerline Pain, MD  guaiFENesin (MUCINEX) 600 MG 12 hr tablet Take 600 mg by mouth 2 (two) times daily as needed for cough.   Yes [provider]  hydrALAZINE (APRESOLINE) 50 MG tablet Take 50 mg by mouth in the morning  and at bedtime.   Yes [provider]  ibuprofen (ADVIL) 200 MG tablet Take 400 mg by mouth every 6 (six) hours as needed for headache or moderate pain.   Yes [provider]  nebivolol (BYSTOLIC) 2.5 MG tablet TAKE 1 TABLET(2.5 MG) BY MOUTH DAILY Patient taking differently: Take 2.5 mg by mouth daily. 01/04/21  Yes Jerline Pain, MD  pravastatin (PRAVACHOL) 20 MG tablet Take 1 tablet (20 mg total) by mouth daily at 6 PM. Patient taking differently: Take 20 mg by mouth at bedtime. 05/25/18  Yes [place], Carley D, DO  azithromycin (ZITHROMAX) 250 MG tablet Take 250-500 mg by mouth See admin instructions. 2 tabs day 1 1 tab daily  days 2-5 Patient not taking: Reported on 05/14/2021 05/05/21   [provider]  hydrALAZINE (APRESOLINE) 50 MG tablet Take 1 tablet (50 mg total) by mouth 2 (two) times a day. 11/16/18 05/14/21  Arrien, Jimmy Picket, MD    Physical Exam: Constitutional: Moderately built and nourished. Vitals:   05/23/2021 1930 06/05/2021 1941 06/08/2021 2035 06/05/2021 2100  BP: (!) 186/90 (!) 186/90  (!) 182/140  Pulse: 73 74  73  Resp: (!) 24 (!) 21  (!) 24  Temp: 98.4 F (36.9 C)     TempSrc: Oral     SpO2: 97% 96%  99%  Weight:   65.5 kg    Eyes: Anicteric no pallor. ENMT: No discharge from the ears eyes nose and mouth. Neck: JVD elevated no mass felt. Respiratory: No rhonchi or crepitations. Cardiovascular: S1-S2 heard. Abdomen: Soft nontender bowel sound present. Musculoskeletal: Mild edema of the both lower extremities. Skin: No rash. Neurologic: Alert awake oriented time place and person.  Moves all extremities. Psychiatric: Appears normal.  Normal affect.   Labs on Admission: I have personally reviewed following labs and imaging studies  CBC: Recent Labs  Lab 06/01/2021 1347 05/24/2021 1556  WBC 13.2*  --   HGB 10.1* 11.2*  HCT 32.2* 33.0*  MCV 95.3  --   PLT 130*  --    Basic Metabolic Panel: Recent Labs  Lab 05/21/2021 1347 05/29/2021 1556  NA 142 143  K 4.3 4.1  CL 107  --   CO2 23  --   GLUCOSE 152*  --   BUN 45*  --   CREATININE 1.71*  --   CALCIUM 9.6  --    GFR: CrCl cannot be calculated (Unknown ideal weight.). Liver Function Tests: No results for input(s): AST, ALT, ALKPHOS, BILITOT, PROT, ALBUMIN in the last 168 hours. No results for input(s): LIPASE, AMYLASE in the last 168 hours. No results for input(s): AMMONIA in the last 168 hours. Coagulation Profile: No results for input(s): INR, PROTIME in the last 168 hours. Cardiac Enzymes: No results for input(s): CKTOTAL, CKMB, CKMBINDEX, TROPONINI in the last 168 hours. BNP (last 3 results) No results for  input(s): PROBNP in the last 8760 hours. HbA1C: No results for input(s): HGBA1C in the last 72 hours. CBG: No results for input(s): GLUCAP in the last 168 hours. Lipid Profile: No results for input(s): CHOL, HDL, LDLCALC, TRIG, CHOLHDL, LDLDIRECT in the last 72 hours. Thyroid Function Tests: No results for input(s): TSH, T4TOTAL, FREET4, T3FREE, THYROIDAB in the last 72 hours. Anemia Panel: No results for input(s): VITAMINB12, FOLATE, FERRITIN, TIBC, IRON, RETICCTPCT in the last 72 hours. Urine analysis:    Component Value Date/Time   COLORURINE YELLOW 11/12/2018 1118   APPEARANCEUR CLEAR 11/12/2018 1118   LABSPEC 1.008 11/12/2018 1118  PHURINE 5.0 11/12/2018 [place] 11/12/2018 1118   [place] 11/12/2018 [place] 11/12/2018 1118   [place] 11/12/2018 1118   PROTEINUR 30 (A) 11/12/2018 1118   NITRITE NEGATIVE 11/12/2018 1118   LEUKOCYTESUR LARGE (A) 11/12/2018 1118   Sepsis Labs: @LABRCNTIP (procalcitonin:4,lacticidven:4) ) Recent Results (from the past 240 hour(s))  Resp Panel by RT-PCR (Flu A&B, Covid) Nasopharyngeal Swab     Status: Abnormal   Collection Time: 05/16/2021  1:47 PM   Specimen: Nasopharyngeal Swab; Nasopharyngeal(NP) swabs in vial transport medium  Result Value Ref Range Status   SARS Coronavirus 2 by RT PCR POSITIVE (A) NEGATIVE Final    Comment: (NOTE) SARS-CoV-2 target nucleic acids are DETECTED.  The SARS-CoV-2 RNA is generally detectable in upper respiratory specimens during the acute phase of infection. Positive results are indicative of the presence of the identified virus, but do not rule out bacterial infection or co-infection with other pathogens not detected by the test. Clinical correlation with patient history and other diagnostic information is necessary to determine patient infection status. The expected result is Negative.  Fact Sheet for  Patients: EntrepreneurPulse.com.au  Fact Sheet for Healthcare Providers: IncredibleEmployment.be  This test is not yet approved or cleared by the [place] FDA and  has been authorized for detection and/or diagnosis of SARS-CoV-2 by FDA under an Emergency Use Authorization (EUA).  This EUA will remain in effect (meaning this test can be used) for the duration of  the COVID-19 declaration under Section 564(b)(1) of the A ct, 21 U.S.C. section 360bbb-3(b)(1), unless the authorization is terminated or revoked sooner.     Influenza A by PCR NEGATIVE NEGATIVE Final   Influenza B by PCR NEGATIVE NEGATIVE Final    Comment: (NOTE) The Xpert Xpress SARS-CoV-2/FLU/RSV plus assay is intended as an aid in the diagnosis of influenza from Nasopharyngeal swab specimens and should not be used as a sole basis for treatment. Nasal washings and aspirates are unacceptable for Xpert Xpress SARS-CoV-2/FLU/RSV testing.  Fact Sheet for Patients: EntrepreneurPulse.com.au  Fact Sheet for Healthcare Providers: IncredibleEmployment.be  This test is not yet approved or cleared by the [place] FDA and has been authorized for detection and/or diagnosis of SARS-CoV-2 by FDA under an Emergency Use Authorization (EUA). This EUA will remain in effect (meaning this test can be used) for the duration of the COVID-19 declaration under Section 564(b)(1) of the Act, 21 U.S.C. section 360bbb-3(b)(1), unless the authorization is terminated or revoked.  Performed at [facility], [address], [place], [place] [address]      Radiological Exams on Admission: DG Chest Portable 1 View  Result Date: 05/21/2021 CLINICAL DATA:  COVID, hypoxia cough, shortness of breath EXAM: PORTABLE CHEST 1 VIEW COMPARISON:  11/12/2018 FINDINGS: Cardiomegaly. Mild, diffuse bilateral interstitial pulmonary opacity. The visualized  skeletal structures are unremarkable. IMPRESSION: Cardiomegaly with mild, diffuse bilateral interstitial pulmonary opacity, consistent with edema or infection. No focal airspace opacity. Electronically Signed   By: Delanna Ahmadi M.D.   On: 05/31/2021 15:48    EKG: Independently reviewed.  Normal sinus rhythm.  Assessment/Plan Principal Problem:   Acute respiratory failure with hypoxia (HCC) Active Problems:   Essential hypertension   CKD (chronic kidney disease), stage III (HCC)   COVID-19   Acute on chronic diastolic CHF (congestive heart failure) (HCC)   Acute respiratory failure with hypoxemia (HCC)    Acute respiratory failure with hypoxia initially requiring BiPAP presently weaned off after given Lasix.  Likely a  combination of CHF and pneumonia. Acute on chronic diastolic CHF last EF measured in July 2020 was 50 to 55%.  Patient received Lasix 40 mg IV in the ER I have placed patient on 20 mg IV every 12.  Closely follow intake output metabolic panel. COVID-19 pneumonia for which patient is on remdesivir and Decadron.  I have also continued on empiric antibiotics check procalcitonin continue to monitor inflammatory markers and respiratory status. Chronic kidney disease stage IV creatinine appears to be at baseline. Hypertensive urgency likely contributing to patient's symptoms.  Patient is on hydralazine and beta-blockers.  As needed IV hydralazine has been ordered.  Closely follow blood pressure trends. Anemia follow CBC.  Check anemia panel with next blood draw. Prior history of lower extremity DVT presently not on anticoagulants.  Since patient has anemia we will check Dopplers to rule out DVT. History of breast cancer in remission.  Since patient has acute respiratory failure with hypoxemia and COVID infection with CHF will need close monitoring for any further worsening and inpatient status.   DVT prophylaxis: Lovenox. Code Status: DNR Family Communication: We will need to  discuss with family. Disposition Plan: Home when stable. Consults called: None. Admission status: Inpatient.   Rise Patience MD [facility] Pager [phone].  If 7PM-7AM, please contact night-coverage [email or web address] Password [facility]  05/09/2021, 9:50 PM

## 2021-05-30 NOTE — Progress Notes (Addendum)
Pt is comfortable at this time and doing well with 3L [place] sats at 97%. No need of bipap at this time. Bipap order made PRN per MD verbal orders. Machine remained bedside if needed. RN aware.

## 2021-05-30 NOTE — ED Provider Notes (Signed)
[facility] EMERGENCY DEPT Provider Note   CSN: [id number] Arrival date & time: 05/11/2021  1315     History  Chief Complaint  Patient presents with   Shortness of Breath    Rhonda Sharp is a 88 y.o. female.   Shortness of Breath Associated symptoms: cough    88 year old female with a history of HTN, CVA, breast cancer s/p radiation who presents to the emergency department with respiratory distress.  The history is provided by the patient and the patient's daughter who states that the patient has had roughly 1 day of cough and shortness of breath.  She denies any fevers or chills.  She denies any chest pain.  She was promptly bedded on arrival and found to be hypoxic to 86% on room air and subsequent placed on 2 L O2 via nasal cannula which was then escalated to BiPAP due to persistent dyspnea at rest.  On arrival, I had a discussion with the patient regarding her CODE STATUS.  She does not want intubation or CPR.  She is comfortable with noninvasive and minimally invasive measures of support.  Home Medications Prior to Admission medications   Medication Sig Start Date End Date Taking? Authorizing Provider  acetaminophen (TYLENOL) 500 MG tablet Take 1,000 mg by mouth every 6 (six) hours as needed for mild pain.   Yes [provider]  allopurinol (ZYLOPRIM) 100 MG tablet Take 100 mg by mouth daily. 05/21/20  Yes [provider]  amLODipine (NORVASC) 5 MG tablet TAKE 1 TABLET(5 MG) BY MOUTH DAILY Patient taking differently: Take 5 mg by mouth daily. 06/01/20  Yes Jerline Pain, MD  aspirin EC 81 MG tablet Take 1 tablet (81 mg total) by mouth daily. 04/12/19  Yes Jerline Pain, MD  benzonatate (TESSALON) 100 MG capsule Take 100 mg by mouth 3 (three) times daily as needed for cough. 05/05/21  Yes [provider]  fexofenadine (ALLEGRA) 180 MG tablet Take 180 mg by mouth daily.   Yes [provider]  furosemide (LASIX) 20 MG tablet TAKE  1 TABLET(20 MG) BY MOUTH DAILY Patient taking differently: Take 20 mg by mouth daily. 07/07/20  Yes Jerline Pain, MD  guaiFENesin (MUCINEX) 600 MG 12 hr tablet Take 600 mg by mouth 2 (two) times daily as needed for cough.   Yes [provider]  hydrALAZINE (APRESOLINE) 50 MG tablet Take 50 mg by mouth in the morning and at bedtime.   Yes [provider]  ibuprofen (ADVIL) 200 MG tablet Take 400 mg by mouth every 6 (six) hours as needed for headache or moderate pain.   Yes [provider]  nebivolol (BYSTOLIC) 2.5 MG tablet TAKE 1 TABLET(2.5 MG) BY MOUTH DAILY Patient taking differently: Take 2.5 mg by mouth daily. 01/04/21  Yes Jerline Pain, MD  pravastatin (PRAVACHOL) 20 MG tablet Take 1 tablet (20 mg total) by mouth daily at 6 PM. Patient taking differently: Take 20 mg by mouth at bedtime. 05/25/18  Yes [place]field, Carley D, DO  azithromycin (ZITHROMAX) 250 MG tablet Take 250-500 mg by mouth See admin instructions. 2 tabs day 1 1 tab daily days 2-5 Patient not taking: Reported on 05/14/2021 05/05/21   [provider]  hydrALAZINE (APRESOLINE) 50 MG tablet Take 1 tablet (50 mg total) by mouth 2 (two) times a day. 11/16/18 05/14/21  Arrien, Jimmy Picket, MD      Allergies    Sulfa antibiotics    Review of Systems   Review of  Systems  Respiratory:  Positive for cough and shortness of breath.   All other systems reviewed and are negative.  Physical Exam Updated Vital Signs BP (!) 182/140    Pulse 73    Temp 98.4 F (36.9 C) (Oral)    Resp (!) 24    Wt 65.5 kg    SpO2 99%    BMI 25.58 kg/m  Physical Exam Vitals and nursing note reviewed.  Constitutional:      General: She is in acute distress.     Appearance: She is well-developed.  HENT:     Head: Normocephalic and atraumatic.  Eyes:     Conjunctiva/sclera: Conjunctivae normal.  Neck:     Vascular: No JVD.  Cardiovascular:     Rate and Rhythm: Normal rate and regular rhythm.     Heart sounds: No  murmur heard. Pulmonary:     Effort: Tachypnea and respiratory distress present.     Breath sounds: Examination of the right-lower field reveals rhonchi. Examination of the left-lower field reveals rhonchi. Rhonchi present. No wheezing.     Comments: Some transmitted upper airway sounds present, sounds consistent with a pleural friction rub rather than wheezing Abdominal:     Palpations: Abdomen is soft.     Tenderness: There is no abdominal tenderness.  Musculoskeletal:        General: No swelling.     Cervical back: Neck supple.     Right lower leg: Edema present.     Left lower leg: Edema present.     Comments: 1+ pitting edema bilaterally  Skin:    General: Skin is warm and dry.     Capillary Refill: Capillary refill takes less than 2 seconds.  Neurological:     Mental Status: She is alert.  Psychiatric:        Mood and Affect: Mood normal.    ED Results / Procedures / Treatments   Labs (all labs ordered are listed, but only abnormal results are displayed) Labs Reviewed  RESP PANEL BY RT-PCR (FLU A&B, COVID) ARPGX2 - Abnormal; Notable for the following components:      Result Value   SARS Coronavirus 2 by RT PCR POSITIVE (*)    All other components within normal limits  BRAIN NATRIURETIC PEPTIDE - Abnormal; Notable for the following components:   B Natriuretic Peptide 1,198.2 (*)    All other components within normal limits  CBC - Abnormal; Notable for the following components:   WBC 13.2 (*)    RBC 3.38 (*)    Hemoglobin 10.1 (*)    HCT 32.2 (*)    Platelets 130 (*)    All other components within normal limits  BASIC METABOLIC PANEL - Abnormal; Notable for the following components:   Glucose, Bld 152 (*)    BUN 45 (*)    Creatinine, Ser 1.71 (*)    GFR, Estimated 28 (*)    All other components within normal limits  C-REACTIVE PROTEIN - Abnormal; Notable for the following components:   CRP 3.0 (*)    All other components within normal limits  SEDIMENTATION RATE -  Abnormal; Notable for the following components:   Sed Rate 40 (*)    All other components within normal limits  D-DIMER, QUANTITATIVE - Abnormal; Notable for the following components:   D-Dimer, Quant 2.39 (*)    All other components within normal limits  I-STAT ARTERIAL BLOOD GAS, ED - Abnormal; Notable for the following components:   pO2, Arterial 82 (*)  HCT 33.0 (*)    Hemoglobin 11.2 (*)    All other components within normal limits  TROPONIN I (HIGH SENSITIVITY) - Abnormal; Notable for the following components:   Troponin I (High Sensitivity) 26 (*)    All other components within normal limits  TROPONIN I (HIGH SENSITIVITY) - Abnormal; Notable for the following components:   Troponin I (High Sensitivity) 29 (*)    All other components within normal limits  MRSA NEXT GEN BY PCR, NASAL  BLOOD GAS, ARTERIAL    EKG EKG Interpretation  Date/Time:  Sunday May 30 2021 13:29:04 EST Ventricular Rate:  69 PR Interval:    QRS Duration: 72 QT Interval:  416 QTC Calculation: 445 R Axis:   90 Text Interpretation: Sinus rhythm Rightward axis Borderline ECG Confirmed by Regan Lemming ([id number]) on 05/21/2021 3:21:29 PM  Radiology DG Chest Portable 1 View  Result Date: 06/02/2021 CLINICAL DATA:  COVID, hypoxia cough, shortness of breath EXAM: PORTABLE CHEST 1 VIEW COMPARISON:  11/12/2018 FINDINGS: Cardiomegaly. Mild, diffuse bilateral interstitial pulmonary opacity. The visualized skeletal structures are unremarkable. IMPRESSION: Cardiomegaly with mild, diffuse bilateral interstitial pulmonary opacity, consistent with edema or infection. No focal airspace opacity. Electronically Signed   By: Delanna Ahmadi M.D.   On: 05/15/2021 15:48    Procedures .Critical Care E&M Performed by: Regan Lemming, MD  Critical care provider statement:    Critical care time (minutes):  36   Critical care was necessary to treat or prevent imminent or life-threatening deterioration of the following  conditions:  Respiratory failure   Critical care was time spent personally by me on the following activities:  Development of treatment plan with patient or surrogate, evaluation of patient's response to treatment, obtaining history from patient or surrogate, ordering and performing treatments and interventions, ordering and review of laboratory studies, ordering and review of radiographic studies, pulse oximetry, re-evaluation of patient's condition and review of old charts   Care discussed with: admitting provider   After initial E/M assessment, critical care services were subsequently performed that were exclusive of separately billable procedures or treatment.      Medications Ordered in ED Medications  albuterol (VENTOLIN HFA) 108 (90 Base) MCG/ACT inhaler 2 puff (has no administration in time range)  remdesivir 100 mg in sodium chloride 0.9 % 100 mL IVPB (has no administration in time range)  Chlorhexidine Gluconate Cloth 2 % PADS 6 each (6 each Topical Given 05/14/2021 2041)  chlorhexidine (PERIDEX) 0.12 % solution 15 mL (has no administration in time range)  MEDLINE mouth rinse (has no administration in time range)  hydrALAZINE (APRESOLINE) tablet 50 mg (has no administration in time range)  ipratropium-albuterol (DUONEB) 0.5-2.5 (3) MG/3ML nebulizer solution 3 mL (3 mLs Nebulization Given 05/29/2021 1356)  furosemide (LASIX) injection 40 mg (40 mg Intravenous Given 05/22/2021 1519)  dexamethasone (DECADRON) injection 10 mg (10 mg Intravenous Given 05/12/2021 1519)  remdesivir 100 mg in sodium chloride 0.9 % 100 mL IVPB (0 mg Intravenous Stopped 05/17/2021 1610)    Followed by  remdesivir 100 mg in sodium chloride 0.9 % 100 mL IVPB (0 mg Intravenous Stopped 05/15/2021 1644)  cefTRIAXone (ROCEPHIN) 1 g in sodium chloride 0.9 % 100 mL IVPB (0 g Intravenous Stopped 05/09/2021 1720)  azithromycin (ZITHROMAX) 500 mg in sodium chloride 0.9 % 250 mL IVPB (0 mg Intravenous Stopped 06/05/2021 1852)    ED Course/  Medical Decision Making/ A&P Clinical Course as of 05/25/2021 2140  Sun May 30, 2021  1502 SARS Coronavirus 2 by RT  PCR(!): POSITIVE [JL]  1502 B Natriuretic Peptide(!): 1,198.2 [JL]  2140 WBC(!): 13.2 [JL]    Clinical Course User Index [JL] Regan Lemming, MD                           Medical Decision Making Amount and/or Complexity of Data Reviewed Labs: ordered. Decision-making details documented in ED Course. Radiology: ordered.  Risk Prescription drug management. Decision regarding hospitalization.   88 year old female with a history of HTN, CVA, breast cancer s/p radiation who presents to the emergency department with respiratory distress.  The history is provided by the patient and the patient's daughter who states that the patient has had roughly 1 day of cough and shortness of breath.  She denies any fevers or chills.  She denies any chest pain.  She was promptly bedded on arrival and found to be hypoxic to 86% on room air and subsequent placed on 2 L O2 via nasal cannula which was then escalated to BiPAP due to persistent dyspnea at rest.  On arrival, I had a discussion with the patient regarding her CODE STATUS.  She does not want intubation or CPR.  She is comfortable with noninvasive and minimally invasive measures of support.  On arrival, the patient was afebrile, not tachycardic, tachypneic in respiratory distress, hypertensive BP 172/69, saturating between 86 to 93% on room air, subsequent placed on 2 L O2 via nasal cannula, escalated to 3 L and subsequently BiPAP.  Differential diagnosis includes flash pulmonary edema, new onset CHF (last EF notably low normal 50 to 55% in 2020), PE, ACS, COVID-19, influenza, pneumonia, pneumothorax, pleural effusion.   Initial EKG did not reveal ischemic changes, normal sinus rhythm noted, ventricular rate 99, normal intervals.  Chest x-ray was performed which revealed cardiomegaly with mild diffuse bilateral interstitial pulmonary  opacities consistent with edema or infection with no focal airspace disease.  This was reviewed by myself and radiology and I agree with the radiologist interpretation.  Favor likely edema based on the patient's physical exam with bilateral lower extremity edema and rales heard in the lower lung fields.  The patient is reportedly on Lasix 20 mg outpatient.  She was ordered 40 mg of Lasix in the ED.  She improved significantly from a respiratory standpoint while on BiPAP.  An ABG revealed borderline low oxygen saturations at 82 PO2, normal pH, normal PCO2.  A CBC revealed a leukocytosis to 13.2 and anemia to 10.1 and a thrombocytopenia to 130.  She did test positive for COVID-19 via PCR testing.  Due to this, she was started on Decadron and remdesivir.  She was administered a DuoNeb through BiPAP.  An initial troponin was mildly elevated to 29.  Concern for acute hypoxic respiratory failure in the setting of COVID-19, new onset or worsening CHF with rales, pulmonary edema and lower extremity edema present.  The patient was also covered for bacterial infection given her leukocytosis and multifocal opacities seen on chest x-ray with azithromycin and Rocephin.  A BNP was elevated to 1198. Additionally considered flash pulmonary edema given the patient's hypertension and pulmonary edema with improvement on BiPAP.   Final Clinical Impression(s) / ED Diagnoses Final diagnoses:  Acute respiratory failure with hypoxia ([place])  COVID-19  Acute pulmonary edema [facility])    Rx / DC Orders ED Discharge Orders     None         Regan Lemming, MD 05/27/2021 2141

## 2021-05-30 NOTE — ED Notes (Signed)
ED Provider at bedside. 

## 2021-05-30 NOTE — ED Triage Notes (Signed)
Pt presents to ED POV w/ daughter. Pt c/o cough and SOB. Pt audibly wheezing in triage.

## 2021-05-30 NOTE — Progress Notes (Signed)
Ms. Bugh was placed on a 3L [place] for transport requirements to [facility].

## 2021-05-30 NOTE — Progress Notes (Addendum)
Demo pt how to use IS and flutter. Pt did well with both.

## 2021-05-30 NOTE — ED Notes (Signed)
Handoff report given to Probation officer at [place]

## 2021-05-31 ENCOUNTER — Inpatient Hospital Stay (HOSPITAL_COMMUNITY): Payer: Medicare Other

## 2021-05-31 DIAGNOSIS — J1282 Pneumonia due to coronavirus disease 2019: Secondary | ICD-10-CM

## 2021-05-31 DIAGNOSIS — I82401 Acute embolism and thrombosis of unspecified deep veins of right lower extremity: Secondary | ICD-10-CM | POA: Diagnosis not present

## 2021-05-31 DIAGNOSIS — I5033 Acute on chronic diastolic (congestive) heart failure: Secondary | ICD-10-CM | POA: Diagnosis not present

## 2021-05-31 DIAGNOSIS — R410 Disorientation, unspecified: Secondary | ICD-10-CM

## 2021-05-31 DIAGNOSIS — I10 Essential (primary) hypertension: Secondary | ICD-10-CM | POA: Diagnosis not present

## 2021-05-31 DIAGNOSIS — N184 Chronic kidney disease, stage 4 (severe): Secondary | ICD-10-CM

## 2021-05-31 DIAGNOSIS — J9601 Acute respiratory failure with hypoxia: Secondary | ICD-10-CM | POA: Diagnosis not present

## 2021-05-31 LAB — CBC WITH DIFFERENTIAL/PLATELET
Abs Immature Granulocytes: 0.1 10*3/uL — ABNORMAL HIGH (ref 0.00–0.07)
Basophils Absolute: 0 10*3/uL (ref 0.0–0.1)
Basophils Relative: 0 %
Eosinophils Absolute: 0 10*3/uL (ref 0.0–0.5)
Eosinophils Relative: 0 %
HCT: 34.5 % — ABNORMAL LOW (ref 36.0–46.0)
Hemoglobin: 10.8 g/dL — ABNORMAL LOW (ref 12.0–15.0)
Immature Granulocytes: 1 %
Lymphocytes Relative: 14 %
Lymphs Abs: 1.1 10*3/uL (ref 0.7–4.0)
MCH: 30 pg (ref 26.0–34.0)
MCHC: 31.3 g/dL (ref 30.0–36.0)
MCV: 95.8 fL (ref 80.0–100.0)
Monocytes Absolute: 0.4 10*3/uL (ref 0.1–1.0)
Monocytes Relative: 5 %
Neutro Abs: 5.9 10*3/uL (ref 1.7–7.7)
Neutrophils Relative %: 80 %
Platelets: 152 10*3/uL (ref 150–400)
RBC: 3.6 MIL/uL — ABNORMAL LOW (ref 3.87–5.11)
RDW: 15.4 % (ref 11.5–15.5)
WBC: 7.4 10*3/uL (ref 4.0–10.5)
nRBC: 0 % (ref 0.0–0.2)

## 2021-05-31 LAB — COMPREHENSIVE METABOLIC PANEL
ALT: 56 U/L — ABNORMAL HIGH (ref 0–44)
AST: 73 U/L — ABNORMAL HIGH (ref 15–41)
Albumin: 4.3 g/dL (ref 3.5–5.0)
Alkaline Phosphatase: 118 U/L (ref 38–126)
Anion gap: 14 (ref 5–15)
BUN: 51 mg/dL — ABNORMAL HIGH (ref 8–23)
CO2: 21 mmol/L — ABNORMAL LOW (ref 22–32)
Calcium: 9.7 mg/dL (ref 8.9–10.3)
Chloride: 106 mmol/L (ref 98–111)
Creatinine, Ser: 1.9 mg/dL — ABNORMAL HIGH (ref 0.44–1.00)
GFR, Estimated: 25 mL/min — ABNORMAL LOW (ref >60)
Glucose, Bld: 140 mg/dL — ABNORMAL HIGH (ref 70–99)
Potassium: 4.2 mmol/L (ref 3.5–5.1)
Sodium: 141 mmol/L (ref 135–145)
Total Bilirubin: 0.9 mg/dL (ref 0.3–1.2)
Total Protein: 7.9 g/dL (ref 6.5–8.1)

## 2021-05-31 LAB — MAGNESIUM: Magnesium: 2.1 mg/dL (ref 1.7–2.4)

## 2021-05-31 LAB — TROPONIN I (HIGH SENSITIVITY): Troponin I (High Sensitivity): 30 ng/L — ABNORMAL HIGH (ref <18)

## 2021-05-31 LAB — MRSA NEXT GEN BY PCR, NASAL: MRSA by PCR Next Gen: NOT DETECTED

## 2021-05-31 LAB — PROCALCITONIN: Procalcitonin: 0.16 ng/mL

## 2021-05-31 LAB — D-DIMER, QUANTITATIVE: D-Dimer, Quant: 2.16 ug/mL-FEU — ABNORMAL HIGH (ref 0.00–0.50)

## 2021-05-31 LAB — TSH: TSH: 0.423 u[IU]/mL (ref 0.350–4.500)

## 2021-05-31 LAB — C-REACTIVE PROTEIN: CRP: 5.4 mg/dL — ABNORMAL HIGH (ref <1.0)

## 2021-05-31 MED ORDER — SODIUM CHLORIDE 0.9 % IV SOLN
INTRAVENOUS | Status: DC | PRN
  Administered 2021-06-02: 11:00:00 10 mL/h via INTRAVENOUS
  Administered 2021-06-04: 0.5 mL via INTRAVENOUS

## 2021-05-31 MED ORDER — HYDRALAZINE HCL 25 MG PO TABS
25.0000 mg | ORAL_TABLET | Freq: Once | ORAL | Status: DC
  Filled 2021-05-31: qty 1

## 2021-05-31 MED ORDER — ORAL CARE MOUTH RINSE
15.0000 mL | Freq: Two times a day (BID) | OROMUCOSAL | Status: DC
  Administered 2021-05-31 – 2021-06-04 (×9): 15 mL via OROMUCOSAL

## 2021-05-31 MED ORDER — HALOPERIDOL LACTATE 5 MG/ML IJ SOLN
2.0000 mg | Freq: Once | INTRAMUSCULAR | Status: AC
  Administered 2021-05-31: 2 mg via INTRAVENOUS
  Filled 2021-05-31: qty 1

## 2021-05-31 MED ORDER — QUETIAPINE FUMARATE 25 MG PO TABS
25.0000 mg | ORAL_TABLET | Freq: Every day | ORAL | Status: DC
  Administered 2021-05-31 – 2021-06-01 (×2): 25 mg via ORAL
  Filled 2021-05-31 (×2): qty 1

## 2021-05-31 MED ORDER — SODIUM CHLORIDE 0.9 % IV SOLN
500.0000 mg | INTRAVENOUS | Status: AC
  Administered 2021-05-31 – 2021-06-03 (×4): 500 mg via INTRAVENOUS
  Filled 2021-05-31 (×5): qty 5

## 2021-05-31 MED ORDER — SODIUM CHLORIDE 0.9 % IV SOLN
2.0000 g | INTRAVENOUS | Status: DC
  Administered 2021-05-31: 2 g via INTRAVENOUS
  Filled 2021-05-31 (×2): qty 20

## 2021-05-31 NOTE — Assessment & Plan Note (Addendum)
-  Continue hydralazine, amlodipine and nebivolol -Continue hyralazine prn

## 2021-05-31 NOTE — Assessment & Plan Note (Signed)
Likely multifactorial in setting of COVID-19 pneumonia and pulmonary edema. Patient initially required BiPAP with oxygen requirement significantly improved after Lasix IV. -Wean to room air as able

## 2021-05-31 NOTE — Progress Notes (Signed)
BLE venous duplex attempted, however only able to complete RLE portion of exam due to patient being combative and refusing to let me perform ultrasound of LLE.  Sharyn Lull, RN made aware.  Results can be found under chart review under CV PROC. 05/31/2021 10:43 AM Danise Dehne RVT, RDMS

## 2021-05-31 NOTE — Assessment & Plan Note (Signed)
Baseline creatinine of about 2. Stable.

## 2021-05-31 NOTE — Assessment & Plan Note (Signed)
Pulmonary edema suggested on chest x-ray. Given Lasix IV with initial improvement of hypoxia.  -Continue Lasix IV 20 mg BID

## 2021-05-31 NOTE — TOC Initial Note (Signed)
Transition of Care [facility]) - Initial/Assessment Note    Patient Details  Name: Rhonda Sharp MRN: [id number] Date of Birth: 01/14/1929  [facility]) CM/SW Contact:    Lennart Pall, LCSW Phone Number: 05/31/2021, 1:56 PM  Clinical Narrative:                 Met with pt's daughter, Claiborne Billings, today to introduce self/ TOC role.  Pt remains sedated as well as having agitation mostly in the evenings.  Daughter reports that pt has lived with her and her family for ~ 3 years.  Notes that she was independent with mobility and ALDs until she became sick with PNA around Xmas.  Notes that her cognition is good overall with mild memory issues, however, they were able to leave her alone for brief periods of time (I.e. 2-3 hours).  Daughter is hopeful she will make a good recovery from newly diagnosed COVID and to be able to bring her home with family.  We did discuss all possible dc options of home vs home with [facility] vs short term SNF.  Daughter very pleasant and open to all possibilities.  She is aware and agreeable for TOC to follow along and assist with most appropriate plans.  Expected Discharge Plan: [place] Barriers to Discharge: Continued Medical Work up   Patient Goals and CMS Choice Patient states their goals for this hospitalization and ongoing recovery are:: daughter hopeful pt will improve to return home with her/ family      Expected Discharge Plan and Services Expected Discharge Plan: [place] In-house Referral: Clinical Social Work     Living arrangements for the past 2 months: Single Family Home                                      Prior Living Arrangements/Services Living arrangements for the past 2 months: Single Family Home Lives with:: Adult Children Patient language and need for interpreter reviewed:: Yes Do you feel safe going back to the place where you live?: Yes      Need for Family Participation in  Patient Care: Yes (Comment) Care giver support system in place?: Yes (comment)   Criminal Activity/Legal Involvement Pertinent to Current Situation/Hospitalization: No - Comment as needed  Activities of Daily Living Home Assistive Devices/Equipment: Cane (specify quad or straight), Walker (specify type) ADL Screening (condition at time of admission) Patient's cognitive ability adequate to safely complete daily activities?: Yes Is the patient deaf or have difficulty hearing?: Yes Does the patient have difficulty seeing, even when wearing glasses/contacts?: Yes Does the patient have difficulty concentrating, remembering, or making decisions?: Yes Patient able to express need for assistance with ADLs?: Yes Does the patient have difficulty dressing or bathing?: No Independently performs ADLs?: Yes (appropriate for developmental age) Does the patient have difficulty walking or climbing stairs?: Yes Weakness of Legs: Both Weakness of Arms/Hands: None  Permission Sought/Granted Permission sought to share information with : Family Supports Permission granted to share information with : Yes, Verbal Permission Granted  Share Information with NAME: Claiborne Billings     Permission granted to share info w Relationship: daughter  Permission granted to share info w Contact Information: [phone]  Emotional Assessment Appearance:: Appears stated age Attitude/Demeanor/Rapport: Sedated Affect (typically observed): Unable to Assess Orientation: : Oriented to Self Alcohol / Substance Use: Not Applicable Psych Involvement: No (comment)  Admission diagnosis:  Acute respiratory failure with hypoxia (HCC) [J96.01] COVID [U07.1] Acute respiratory failure with hypoxemia ([place]) [J96.01] Patient Active Problem List   Diagnosis Date Noted   COVID-19 05/19/2021   Acute on chronic diastolic CHF (congestive heart failure) ([place]) 05/24/2021   Acute respiratory failure with hypoxemia ([place]) 05/09/2021    Leukocytosis 11/12/2018   Acute exacerbation of CHF (congestive heart failure) ([place]) 11/12/2018   CKD (chronic kidney disease), stage III ([place]) 11/12/2018   Dementia ([place]) 11/12/2018   Subacute confusional state    CHF (congestive heart failure), NYHA class I, acute, systolic (HCC)    Pleural effusion, bilateral    CAP (community acquired pneumonia) 06/06/2018   Chronic deep vein thrombosis (DVT) of popliteal vein of left lower extremity ([place]) 06/06/2018   Acute respiratory failure with hypoxia ([place]) 06/05/2018   Thrombocytopenia ([place]) 05/24/2018   Alcohol abuse, daily use 05/24/2018   AKI (acute kidney injury) ([place]) 05/24/2018   Cerebral thrombosis with cerebral infarction 05/23/2018   Cerebrovascular accident (CVA) ([place])    Acute encephalopathy 05/22/2018   Essential hypertension 05/22/2018   PCP:  [name], DO Pharmacy:   [facility] [place], [place] - [address] [place] [address] [place] [address] Phone: [phone] Fax: [phone]     Social Determinants of Health (SDOH) Interventions    Readmission Risk Interventions Readmission Risk Prevention Plan 05/31/2021  Transportation Screening Complete  PCP or Specialist Appt within 5-7 Days Complete  Home Care Screening Complete  Some recent data might be hidden

## 2021-05-31 NOTE — Progress Notes (Signed)
Patient refusing BP medication and being combative. BP 194'F systolic. MD notified.

## 2021-05-31 NOTE — Assessment & Plan Note (Addendum)
In setting of underlying dementia. TSH normal. -Delirium precautions -CT head/neck -Vitamin B1/B12, Folate, ammonia -Blood cultures -[place]

## 2021-05-31 NOTE — Progress Notes (Signed)
PROGRESS NOTE    Rhonda Sharp  TGG:[id number] DOB: 08-18-1928 DOA: 06/07/2021 PCP: [name], DO   Brief Narrative: Rhonda Sharp is a 88 y.o. female with a history of chronic diastolic heart failure, DVT not on anticoagulation, breast cancer in remission, hypertension, CKD. Patient presented secondary to shortness of breath with evidence of acute respiratory from heart failure and COVID-19 pneumonia. Patient started on IV Lasix with improvement. Remdesivir IV and Decadron started for treatment of COVID-19 pneumonia with hypoxia. Hospitalization complicated by delirium.   Assessment & Plan:   * Acute respiratory failure with hypoxia ([place])- (present on admission) Likely multifactorial in setting of COVID-19 pneumonia and pulmonary edema. Patient initially required BiPAP with oxygen requirement significantly improved after Lasix IV. -Wean to room air as able  Delirium In setting of underlying dementia. -Delirium precautions  Acute on chronic diastolic CHF (congestive heart failure) ([place])- (present on admission) Pulmonary edema suggested on chest x-ray. Given Lasix IV with initial improvement of hypoxia.  -Continue Lasix IV 20 mg BID  Pneumonia due to COVID-19 virus Associated multifocal infiltrates concerning for possible infection vs edema. Associated hypoxia. Patient started on Remdesivir, Ceftriaxone, Azithromycin and Decadron IV. Procalcitonin of 0.16. -Continue Remdesivir IV and Decadron IV -Continue Ceftriaxone and azithromycin for now  CKD (chronic kidney disease), stage IV ([place])- (present on admission) Baseline creatinine of about 2. Stable.  Essential hypertension- (present on admission) -Continue hydralazine, amlodipine     DVT prophylaxis: Lovenox Code Status:   Code Status: DNR Family Communication: Daughter on telephone (8 minutes) Disposition Plan: Discharge home vs SNF pending improvement of clinical status (decreased oxygen needs, improvement  of delirium, stability of labs, etc) likely in 3-5 days   Consultants:  None  Procedures:  None  Antimicrobials: Ceftriaxone Azithromycin Remdesivir    Subjective: Patient significant agitated overnight.  Objective: Vitals:   05/31/21 1100 05/31/21 1128 05/31/21 1200 05/31/21 1300  BP: (!) 180/88 (!) 180/88 (!) 137/102 (!) 165/34  Pulse: 77  77 76  Resp: 20  13 16   Temp:  (!) 97.4 F (36.3 C)    TempSrc:  Oral    SpO2: 98%  99% 96%  Weight:      Height:        Intake/Output Summary (Last 24 hours) at 05/31/2021 1445 Last data filed at 05/31/2021 1100 Gross per 24 hour  Intake 100.17 ml  Output 1530 ml  Net -1429.83 ml   Filed Weights   05/29/2021 2035 05/31/21 0500 05/31/21 0611  Weight: 65.5 kg 65.5 kg 65.5 kg    Examination:  General exam: Appears calm and comfortable Respiratory system: Diminished with transmitted upper airway wheezing. Respiratory effort normal. Cardiovascular system: S1 & S2 heard, RRR. No murmurs, rubs, gallops or clicks. Gastrointestinal system: Abdomen is nondistended, soft and nontender. No organomegaly or masses felt. Normal bowel sounds heard. Central nervous system: Alert and oriented to self only. Answers questions. Follows some commands. No focal neurological deficits. Musculoskeletal: No edema. No calf tenderness. Neck is extended Skin: No cyanosis. No rashes Psychiatry: Judgement and insight appear impaired.    Data Reviewed: I have personally reviewed following labs and imaging studies  CBC Lab Results  Component Value Date   WBC 7.4 05/31/2021   RBC 3.60 (L) 05/31/2021   HGB 10.8 (L) 05/31/2021   HCT 34.5 (L) 05/31/2021   MCV 95.8 05/31/2021   MCH 30.0 05/31/2021   PLT 152 05/31/2021   MCHC 31.3 05/31/2021   RDW 15.4 05/31/2021   LYMPHSABS 1.1 05/31/2021  MONOABS 0.4 05/31/2021   EOSABS 0.0 05/31/2021   BASOSABS 0.0 74/04/8785     Last metabolic panel Lab Results  Component Value Date   NA 141 05/31/2021    K 4.2 05/31/2021   CL 106 05/31/2021   CO2 21 (L) 05/31/2021   BUN 51 (H) 05/31/2021   CREATININE 1.90 (H) 05/31/2021   GLUCOSE 140 (H) 05/31/2021   GFRNONAA 25 (L) 05/31/2021   GFRAA 25 (L) 01/23/2019   CALCIUM 9.7 05/31/2021   PROT 7.9 05/31/2021   ALBUMIN 4.3 05/31/2021   BILITOT 0.9 05/31/2021   ALKPHOS 118 05/31/2021   AST 73 (H) 05/31/2021   ALT 56 (H) 05/31/2021   ANIONGAP 14 05/31/2021    CBG (last 3)  No results for input(s): GLUCAP in the last 72 hours.   GFR: Estimated Creatinine Clearance: 17.9 mL/min (A) (by C-G formula based on SCr of 1.9 mg/dL (H)).  Coagulation Profile: No results for input(s): INR, PROTIME in the last 168 hours.  Recent Results (from the past 240 hour(s))  Resp Panel by RT-PCR (Flu A&B, Covid) Nasopharyngeal Swab     Status: Abnormal   Collection Time: 05/11/2021  1:47 PM   Specimen: Nasopharyngeal Swab; Nasopharyngeal(NP) swabs in vial transport medium  Result Value Ref Range Status   SARS Coronavirus 2 by RT PCR POSITIVE (A) NEGATIVE Final    Comment: (NOTE) SARS-CoV-2 target nucleic acids are DETECTED.  The SARS-CoV-2 RNA is generally detectable in upper respiratory specimens during the acute phase of infection. Positive results are indicative of the presence of the identified virus, but do not rule out bacterial infection or co-infection with other pathogens not detected by the test. Clinical correlation with patient history and other diagnostic information is necessary to determine patient infection status. The expected result is Negative.  Fact Sheet for Patients: EntrepreneurPulse.com.au  Fact Sheet for Healthcare Providers: IncredibleEmployment.be  This test is not yet approved or cleared by the [place] FDA and  has been authorized for detection and/or diagnosis of SARS-CoV-2 by FDA under an Emergency Use Authorization (EUA).  This EUA will remain in effect (meaning this test can  be used) for the duration of  the COVID-19 declaration under Section 564(b)(1) of the A ct, 21 U.S.C. section 360bbb-3(b)(1), unless the authorization is terminated or revoked sooner.     Influenza A by PCR NEGATIVE NEGATIVE Final   Influenza B by PCR NEGATIVE NEGATIVE Final    Comment: (NOTE) The Xpert Xpress SARS-CoV-2/FLU/RSV plus assay is intended as an aid in the diagnosis of influenza from Nasopharyngeal swab specimens and should not be used as a sole basis for treatment. Nasal washings and aspirates are unacceptable for Xpert Xpress SARS-CoV-2/FLU/RSV testing.  Fact Sheet for Patients: EntrepreneurPulse.com.au  Fact Sheet for Healthcare Providers: IncredibleEmployment.be  This test is not yet approved or cleared by the [place] FDA and has been authorized for detection and/or diagnosis of SARS-CoV-2 by FDA under an Emergency Use Authorization (EUA). This EUA will remain in effect (meaning this test can be used) for the duration of the COVID-19 declaration under Section 564(b)(1) of the Act, 21 U.S.C. section 360bbb-3(b)(1), unless the authorization is terminated or revoked.  Performed at [facility], [address], [place], [place] [address]   MRSA Next Gen by PCR, Nasal     Status: None   Collection Time: 05/25/2021  8:09 PM   Specimen: Nasal Mucosa; Nasal Swab  Result Value Ref Range Status   MRSA by PCR Next Gen NOT DETECTED  NOT DETECTED Final    Comment: (NOTE) The GeneXpert MRSA Assay (FDA approved for NASAL specimens only), is one component of a comprehensive MRSA colonization surveillance program. It is not intended to diagnose MRSA infection nor to guide or monitor treatment for MRSA infections. Test performance is not FDA approved in patients less than 27 years old. Performed at [facility], [place] [address], [place], [place] [address]         Radiology Studies: DG Chest  Portable 1 View  Result Date: 05/29/2021 CLINICAL DATA:  COVID, hypoxia cough, shortness of breath EXAM: PORTABLE CHEST 1 VIEW COMPARISON:  11/12/2018 FINDINGS: Cardiomegaly. Mild, diffuse bilateral interstitial pulmonary opacity. The visualized skeletal structures are unremarkable. IMPRESSION: Cardiomegaly with mild, diffuse bilateral interstitial pulmonary opacity, consistent with edema or infection. No focal airspace opacity. Electronically Signed   By: Delanna Ahmadi M.D.   On: 06/06/2021 15:48   VAS [place] LOWER EXTREMITY VENOUS (DVT)  Result Date: 05/31/2021  Lower Venous DVT Study Patient Name:  LUWANA BUTRICK  Date of Exam:   05/31/2021 Medical Rec #: [id number]           Accession #:    [id number] Date of Birth: 1928/11/29           Patient Gender: F Patient Age:   29 years Exam Location:  [facility] Procedure:      VAS [place] LOWER EXTREMITY VENOUS (DVT) Referring Phys: Gean Birchwood --------------------------------------------------------------------------------  Indications: Anemia with history of DVT. Other Indications: COVID+ with elevated d-dimer (2.23). Limitations: Combative and uncooperative. Comparison Study: Previous exam on 06/05/2018 was positive for chronic DVT in LLE                   Pop[employer]. Performing Technologist: Rogelia Rohrer RVT, RDMS  Examination Guidelines: A complete evaluation includes B-mode imaging, spectral Doppler, color Doppler, and power Doppler as needed of all accessible portions of each vessel. Bilateral testing is considered an integral part of a complete examination. Limited examinations for reoccurring indications may be performed as noted. The reflux portion of the exam is performed with the patient in reverse Trendelenburg.  +---------+---------------+---------+-----------+----------+-------------------+  RIGHT     Compressibility Phasicity Spontaneity Properties Thrombus Aging       +---------+---------------+---------+-----------+----------+-------------------+   CFV       Full            Yes       Yes                                         +---------+---------------+---------+-----------+----------+-------------------+  SFJ       Full                                                                  +---------+---------------+---------+-----------+----------+-------------------+  FV Prox   Full            Yes       Yes                                         +---------+---------------+---------+-----------+----------+-------------------+  FV Mid  Full            Yes       Yes                                         +---------+---------------+---------+-----------+----------+-------------------+  FV Distal                                                  Not visualized       +---------+---------------+---------+-----------+----------+-------------------+  PFV       Full                                                                  +---------+---------------+---------+-----------+----------+-------------------+  POP       Full            Yes       Yes                                         +---------+---------------+---------+-----------+----------+-------------------+  PTV       Full                                             Not well visualized  +---------+---------------+---------+-----------+----------+-------------------+  PERO      Full                                             Not well visualized  +---------+---------------+---------+-----------+----------+-------------------+   Left Technical Findings: Left leg not evaluated. LLE unable to be examined due to patient being combative and refusing to position legs so test could be completed.   Summary: RIGHT: - There is no evidence of deep vein thrombosis in the lower extremity. - There is no evidence of superficial venous thrombosis.  - No cystic structure found in the popliteal fossa.   *See table(s) above for measurements and observations.    Preliminary         Scheduled Meds:  allopurinol  100 mg  Oral Daily   amLODipine  5 mg Oral Daily   aspirin EC  81 mg Oral Daily   Chlorhexidine Gluconate Cloth  6 each Topical Daily   dexamethasone (DECADRON) injection  6 mg Intravenous Q24H   enoxaparin (LOVENOX) injection  30 mg Subcutaneous Q24H   furosemide  20 mg Intravenous Q12H   hydrALAZINE  25 mg Oral Once   hydrALAZINE  50 mg Oral BID   loratadine  10 mg Oral Daily   mouth rinse  15 mL Mouth Rinse BID   nebivolol  2.5 mg Oral Daily   pravastatin  20 mg Oral QHS   Continuous Infusions:  sodium chloride Stopped (05/31/21 1036)   azithromycin     cefTRIAXone (  ROCEPHIN)  IV     remdesivir 100 mg in NS 100 mL Stopped (05/31/21 1036)     LOS: 1 day     Cordelia Poche, MD [facility] 05/31/2021, 2:45 PM  If 7PM-7AM, please contact night-coverage [email or web address]

## 2021-05-31 NOTE — Progress Notes (Signed)
Patient very confused, aggressive, and refusing multiple interventions now that daughter has left. Taking off nasal cannula, BP cuff, and refusing care. MD notified. Renae Gloss, RN 05/31/2021

## 2021-05-31 NOTE — Hospital Course (Signed)
Rhonda Sharp is a 88 y.o. female with a history of chronic diastolic heart failure, DVT not on anticoagulation, breast cancer in remission, hypertension, CKD. Patient presented secondary to shortness of breath with evidence of acute respiratory from heart failure and COVID-19 pneumonia. Patient started on IV Lasix with improvement. Remdesivir IV and Decadron started for treatment of COVID-19 pneumonia with hypoxia. Hospitalization complicated by delirium.

## 2021-05-31 NOTE — Assessment & Plan Note (Addendum)
Associated multifocal infiltrates concerning for possible infection vs edema. Associated hypoxia. Patient started on Remdesivir, Ceftriaxone, Azithromycin and Decadron IV. Procalcitonin of 0.16 > 0.24. -Continue Remdesivir IV and Decadron IV -Continue Ceftriaxone and azithromycin for now

## 2021-05-31 NOTE — Progress Notes (Signed)
Patient demonstrating extreme sundowning. Yelling out, kicking, punching and pulling things off. Mitts placed at this time. Renae Gloss, RN 05/31/2021

## 2021-06-01 ENCOUNTER — Inpatient Hospital Stay (HOSPITAL_COMMUNITY): Payer: Medicare Other

## 2021-06-01 DIAGNOSIS — I10 Essential (primary) hypertension: Secondary | ICD-10-CM | POA: Diagnosis not present

## 2021-06-01 DIAGNOSIS — I5033 Acute on chronic diastolic (congestive) heart failure: Secondary | ICD-10-CM | POA: Diagnosis not present

## 2021-06-01 DIAGNOSIS — N184 Chronic kidney disease, stage 4 (severe): Secondary | ICD-10-CM | POA: Diagnosis not present

## 2021-06-01 DIAGNOSIS — J9601 Acute respiratory failure with hypoxia: Secondary | ICD-10-CM | POA: Diagnosis not present

## 2021-06-01 LAB — CBC WITH DIFFERENTIAL/PLATELET
Abs Immature Granulocytes: 0.06 10*3/uL (ref 0.00–0.07)
Basophils Absolute: 0 10*3/uL (ref 0.0–0.1)
Basophils Relative: 0 %
Eosinophils Absolute: 0 10*3/uL (ref 0.0–0.5)
Eosinophils Relative: 0 %
HCT: 33.8 % — ABNORMAL LOW (ref 36.0–46.0)
Hemoglobin: 11.1 g/dL — ABNORMAL LOW (ref 12.0–15.0)
Immature Granulocytes: 1 %
Lymphocytes Relative: 10 %
Lymphs Abs: 0.7 10*3/uL (ref 0.7–4.0)
MCH: 31.2 pg (ref 26.0–34.0)
MCHC: 32.8 g/dL (ref 30.0–36.0)
MCV: 94.9 fL (ref 80.0–100.0)
Monocytes Absolute: 1 10*3/uL (ref 0.1–1.0)
Monocytes Relative: 14 %
Neutro Abs: 5.3 10*3/uL (ref 1.7–7.7)
Neutrophils Relative %: 75 %
Platelets: 144 10*3/uL — ABNORMAL LOW (ref 150–400)
RBC: 3.56 MIL/uL — ABNORMAL LOW (ref 3.87–5.11)
RDW: 15.5 % (ref 11.5–15.5)
WBC: 7.2 10*3/uL (ref 4.0–10.5)
nRBC: 0 % (ref 0.0–0.2)

## 2021-06-01 LAB — COMPREHENSIVE METABOLIC PANEL
ALT: 47 U/L — ABNORMAL HIGH (ref 0–44)
AST: 67 U/L — ABNORMAL HIGH (ref 15–41)
Albumin: 3.8 g/dL (ref 3.5–5.0)
Alkaline Phosphatase: 91 U/L (ref 38–126)
Anion gap: 13 (ref 5–15)
BUN: 67 mg/dL — ABNORMAL HIGH (ref 8–23)
CO2: 23 mmol/L (ref 22–32)
Calcium: 9.5 mg/dL (ref 8.9–10.3)
Chloride: 107 mmol/L (ref 98–111)
Creatinine, Ser: 1.89 mg/dL — ABNORMAL HIGH (ref 0.44–1.00)
GFR, Estimated: 25 mL/min — ABNORMAL LOW (ref >60)
Glucose, Bld: 123 mg/dL — ABNORMAL HIGH (ref 70–99)
Potassium: 3.6 mmol/L (ref 3.5–5.1)
Sodium: 143 mmol/L (ref 135–145)
Total Bilirubin: 0.6 mg/dL (ref 0.3–1.2)
Total Protein: 7.2 g/dL (ref 6.5–8.1)

## 2021-06-01 LAB — VITAMIN B12: Vitamin B-12: 527 pg/mL (ref 180–914)

## 2021-06-01 LAB — FOLATE: Folate: 10.3 ng/mL (ref >5.9)

## 2021-06-01 LAB — AMMONIA: Ammonia: 17 umol/L (ref 9–35)

## 2021-06-01 LAB — PROCALCITONIN: Procalcitonin: 0.24 ng/mL

## 2021-06-01 LAB — D-DIMER, QUANTITATIVE: D-Dimer, Quant: 1.64 ug/mL-FEU — ABNORMAL HIGH (ref 0.00–0.50)

## 2021-06-01 LAB — C-REACTIVE PROTEIN: CRP: 3.7 mg/dL — ABNORMAL HIGH (ref <1.0)

## 2021-06-01 MED ORDER — METHOCARBAMOL 1000 MG/10ML IJ SOLN
500.0000 mg | Freq: Once | INTRAVENOUS | Status: AC
  Administered 2021-06-01: 16:00:00 500 mg via INTRAVENOUS
  Filled 2021-06-01: qty 500

## 2021-06-01 MED ORDER — SODIUM CHLORIDE 0.9 % IV SOLN
1.0000 g | INTRAVENOUS | Status: AC
  Administered 2021-06-01 – 2021-06-03 (×3): 1 g via INTRAVENOUS
  Filled 2021-06-01 (×4): qty 10

## 2021-06-01 NOTE — Progress Notes (Signed)
PROGRESS NOTE    Rhonda Sharp  QHU:[id number] DOB: 01/27/1929 DOA: 05/29/2021 PCP: [name], DO   Brief Narrative: Rhonda Sharp is a 88 y.o. female with a history of chronic diastolic heart failure, DVT not on anticoagulation, breast cancer in remission, hypertension, CKD. Patient presented secondary to shortness of breath with evidence of acute respiratory from heart failure and COVID-19 pneumonia. Patient started on IV Lasix with improvement. Remdesivir IV and Decadron started for treatment of COVID-19 pneumonia with hypoxia. Hospitalization complicated by delirium.   Assessment & Plan:   * Acute respiratory failure with hypoxia ([place])- (present on admission) Likely multifactorial in setting of COVID-19 pneumonia and pulmonary edema. Patient initially required BiPAP with oxygen requirement significantly improved after Lasix IV. -Wean to room air as able  Delirium In setting of underlying dementia. TSH normal. -Delirium precautions -CT head/neck -Vitamin B1/B12, Folate, ammonia -Blood cultures -Sitter  Acute on chronic diastolic CHF (congestive heart failure) (HCC)- (present on admission) Pulmonary edema suggested on chest x-ray. Given Lasix IV with initial improvement of hypoxia.  -Continue Lasix IV 20 mg BID  Pneumonia due to COVID-19 virus Associated multifocal infiltrates concerning for possible infection vs edema. Associated hypoxia. Patient started on Remdesivir, Ceftriaxone, Azithromycin and Decadron IV. Procalcitonin of 0.16 > 0.24. -Continue Remdesivir IV and Decadron IV -Continue Ceftriaxone and azithromycin for now  CKD (chronic kidney disease), stage IV ([place])- (present on admission) Baseline creatinine of about 2. Stable.  Essential hypertension- (present on admission) -Continue hydralazine, amlodipine and nebivolol -Continue hyralazine prn     DVT prophylaxis: Lovenox Code Status:   Code Status: DNR Family Communication: Daughter on  telephone (1/24) Disposition Plan: Discharge home vs SNF pending improvement of clinical status (decreased oxygen needs, improvement of delirium, stability of labs, etc) likely in 3-5 days   Consultants:  None  Procedures:  None  Antimicrobials: Ceftriaxone Azithromycin Remdesivir    Subjective: Patient with continued agitation  Objective: Vitals:   06/01/21 0550 06/01/21 1004 06/01/21 1004 06/01/21 1331  BP: (!) 142/89 136/77 136/77 122/87  Pulse: 89  86 84  Resp: 18  18 16   Temp: 98 F (36.7 C)  99.4 F (37.4 C) 98.2 F (36.8 C)  TempSrc: Oral  Oral Oral  SpO2: 96%  96% 95%  Weight:      Height:        Intake/Output Summary (Last 24 hours) at 06/01/2021 1351 Last data filed at 06/01/2021 1245 Gross per 24 hour  Intake 308.22 ml  Output 1625 ml  Net -1316.78 ml    Filed Weights   05/31/21 0500 05/31/21 0611 06/01/21 0500  Weight: 65.5 kg 65.5 kg 65.5 kg    Examination:  General exam: Appears slightly agitated Respiratory system: Diminished. Respiratory effort normal. Cardiovascular system: S1 & S2 heard, RRR. No murmurs. Gastrointestinal system: Abdomen is nondistended, soft and nontender. No organomegaly or masses felt. Normal bowel sounds heard. Central nervous system: Alert and oriented to self.  Musculoskeletal: No edema. No calf tenderness Skin: No cyanosis. No rashes Psychiatry: Judgement and insight appear impaired.    Data Reviewed: I have personally reviewed following labs and imaging studies  CBC Lab Results  Component Value Date   WBC 7.2 06/01/2021   RBC 3.56 (L) 06/01/2021   HGB 11.1 (L) 06/01/2021   HCT 33.8 (L) 06/01/2021   MCV 94.9 06/01/2021   MCH 31.2 06/01/2021   PLT 144 (L) 06/01/2021   MCHC 32.8 06/01/2021   RDW 15.5 06/01/2021   LYMPHSABS 0.7 06/01/2021  MONOABS 1.0 06/01/2021   EOSABS 0.0 06/01/2021   BASOSABS 0.0 12/20/4816     Last metabolic panel Lab Results  Component Value Date   NA 143 06/01/2021   K  3.6 06/01/2021   CL 107 06/01/2021   CO2 23 06/01/2021   BUN 67 (H) 06/01/2021   CREATININE 1.89 (H) 06/01/2021   GLUCOSE 123 (H) 06/01/2021   GFRNONAA 25 (L) 06/01/2021   GFRAA 25 (L) 01/23/2019   CALCIUM 9.5 06/01/2021   PROT 7.2 06/01/2021   ALBUMIN 3.8 06/01/2021   BILITOT 0.6 06/01/2021   ALKPHOS 91 06/01/2021   AST 67 (H) 06/01/2021   ALT 47 (H) 06/01/2021   ANIONGAP 13 06/01/2021    CBG (last 3)  No results for input(s): GLUCAP in the last 72 hours.   GFR: Estimated Creatinine Clearance: 18 mL/min (A) (by C-G formula based on SCr of 1.89 mg/dL (H)).  Coagulation Profile: No results for input(s): INR, PROTIME in the last 168 hours.  Recent Results (from the past 240 hour(s))  Resp Panel by RT-PCR (Flu A&B, Covid) Nasopharyngeal Swab     Status: Abnormal   Collection Time: 05/26/2021  1:47 PM   Specimen: Nasopharyngeal Swab; Nasopharyngeal(NP) swabs in vial transport medium  Result Value Ref Range Status   SARS Coronavirus 2 by RT PCR POSITIVE (A) NEGATIVE Final    Comment: (NOTE) SARS-CoV-2 target nucleic acids are DETECTED.  The SARS-CoV-2 RNA is generally detectable in upper respiratory specimens during the acute phase of infection. Positive results are indicative of the presence of the identified virus, but do not rule out bacterial infection or co-infection with other pathogens not detected by the test. Clinical correlation with patient history and other diagnostic information is necessary to determine patient infection status. The expected result is Negative.  Fact Sheet for Patients: EntrepreneurPulse.com.au  Fact Sheet for Healthcare Providers: IncredibleEmployment.be  This test is not yet approved or cleared by the [place] FDA and  has been authorized for detection and/or diagnosis of SARS-CoV-2 by FDA under an Emergency Use Authorization (EUA).  This EUA will remain in effect (meaning this test can be used) for  the duration of  the COVID-19 declaration under Section 564(b)(1) of the A ct, 21 U.S.C. section 360bbb-3(b)(1), unless the authorization is terminated or revoked sooner.     Influenza A by PCR NEGATIVE NEGATIVE Final   Influenza B by PCR NEGATIVE NEGATIVE Final    Comment: (NOTE) The Xpert Xpress SARS-CoV-2/FLU/RSV plus assay is intended as an aid in the diagnosis of influenza from Nasopharyngeal swab specimens and should not be used as a sole basis for treatment. Nasal washings and aspirates are unacceptable for Xpert Xpress SARS-CoV-2/FLU/RSV testing.  Fact Sheet for Patients: EntrepreneurPulse.com.au  Fact Sheet for Healthcare Providers: IncredibleEmployment.be  This test is not yet approved or cleared by the [place] FDA and has been authorized for detection and/or diagnosis of SARS-CoV-2 by FDA under an Emergency Use Authorization (EUA). This EUA will remain in effect (meaning this test can be used) for the duration of the COVID-19 declaration under Section 564(b)(1) of the Act, 21 U.S.C. section 360bbb-3(b)(1), unless the authorization is terminated or revoked.  Performed at [facility], [address] [place], [place] [address]   MRSA Next Gen by PCR, Nasal     Status: None   Collection Time: 06/03/2021  8:09 PM   Specimen: Nasal Mucosa; Nasal Swab  Result Value Ref Range Status   MRSA by PCR Next Gen NOT DETECTED NOT  DETECTED Final    Comment: (NOTE) The GeneXpert MRSA Assay (FDA approved for NASAL specimens only), is one component of a comprehensive MRSA colonization surveillance program. It is not intended to diagnose MRSA infection nor to guide or monitor treatment for MRSA infections. Test performance is not FDA approved in patients less than 21 years old. Performed at [facility], [place] [address], [place], [place] [address]         Radiology Studies: DG Chest Portable 1  View  Result Date: 06/08/2021 CLINICAL DATA:  COVID, hypoxia cough, shortness of breath EXAM: PORTABLE CHEST 1 VIEW COMPARISON:  11/12/2018 FINDINGS: Cardiomegaly. Mild, diffuse bilateral interstitial pulmonary opacity. The visualized skeletal structures are unremarkable. IMPRESSION: Cardiomegaly with mild, diffuse bilateral interstitial pulmonary opacity, consistent with edema or infection. No focal airspace opacity. Electronically Signed   By: Delanna Ahmadi M.D.   On: 05/11/2021 15:48   VAS [place] LOWER EXTREMITY VENOUS (DVT)  Result Date: 06/01/2021  Lower Venous DVT Study Patient Name:  LILLYTH SPONG  Date of Exam:   05/31/2021 Medical Rec #: [id number]           Accession #:    [id number] Date of Birth: 10-29-1928           Patient Gender: F Patient Age:   8 years Exam Location:  [facility] Procedure:      VAS [place] LOWER EXTREMITY VENOUS (DVT) Referring Phys: Gean Birchwood --------------------------------------------------------------------------------  Indications: Anemia with history of DVT. Other Indications: COVID+ with elevated d-dimer (2.23). Limitations: Combative and uncooperative. Comparison Study: Previous exam on 06/05/2018 was positive for chronic DVT in LLE                   PopV. Performing Technologist: Rogelia Rohrer RVT, RDMS  Examination Guidelines: A complete evaluation includes B-mode imaging, spectral Doppler, color Doppler, and power Doppler as needed of all accessible portions of each vessel. Bilateral testing is considered an integral part of a complete examination. Limited examinations for reoccurring indications may be performed as noted. The reflux portion of the exam is performed with the patient in reverse Trendelenburg.  +---------+---------------+---------+-----------+----------+-------------------+  RIGHT     Compressibility Phasicity Spontaneity Properties Thrombus Aging       +---------+---------------+---------+-----------+----------+-------------------+  CFV        Full            Yes       Yes                                         +---------+---------------+---------+-----------+----------+-------------------+  SFJ       Full                                                                  +---------+---------------+---------+-----------+----------+-------------------+  FV Prox   Full            Yes       Yes                                         +---------+---------------+---------+-----------+----------+-------------------+  FV Mid  Full            Yes       Yes                                         +---------+---------------+---------+-----------+----------+-------------------+  FV Distal                                                  Not visualized       +---------+---------------+---------+-----------+----------+-------------------+  PFV       Full                                                                  +---------+---------------+---------+-----------+----------+-------------------+  POP       Full            Yes       Yes                                         +---------+---------------+---------+-----------+----------+-------------------+  PTV       Full                                             Not well visualized  +---------+---------------+---------+-----------+----------+-------------------+  PERO      Full                                             Not well visualized  +---------+---------------+---------+-----------+----------+-------------------+   Left Technical Findings: Left leg not evaluated. LLE unable to be examined due to patient being combative and refusing to position legs so test could be completed.   Summary: RIGHT: - There is no evidence of deep vein thrombosis in the lower extremity. - There is no evidence of superficial venous thrombosis.  - No cystic structure found in the popliteal fossa.   *See table(s) above for measurements and observations. Electronically signed by Orlie Pollen on 06/01/2021 at 6:44:00 AM.    Final          Scheduled Meds:  allopurinol  100 mg Oral Daily   amLODipine  5 mg Oral Daily   aspirin EC  81 mg Oral Daily   Chlorhexidine Gluconate Cloth  6 each Topical Daily   dexamethasone (DECADRON) injection  6 mg Intravenous Q24H   enoxaparin (LOVENOX) injection  30 mg Subcutaneous Q24H   furosemide  20 mg Intravenous Q12H   hydrALAZINE  25 mg Oral Once   hydrALAZINE  50 mg Oral BID   loratadine  10 mg Oral Daily   mouth rinse  15 mL Mouth Rinse BID   nebivolol  2.5 mg Oral Daily   pravastatin  20 mg Oral QHS   QUEtiapine  25 mg Oral QHS  Continuous Infusions:  sodium chloride Stopped (05/31/21 1036)   azithromycin Stopped (05/31/21 1904)   cefTRIAXone (ROCEPHIN)  IV     methocarbamol (ROBAXIN) IV     remdesivir 100 mg in NS 100 mL 100 mg (06/01/21 1106)     LOS: 2 days     Cordelia Poche, MD [facility] 06/01/2021, 1:51 PM  If 7PM-7AM, please contact night-coverage [email or web address]

## 2021-06-01 NOTE — Plan of Care (Signed)
°  Problem: Clinical Measurements: Goal: Ability to maintain clinical measurements within normal limits will improve Outcome: Progressing Goal: Will remain free from infection Outcome: Progressing Goal: Diagnostic test results will improve Outcome: Progressing Goal: Respiratory complications will improve Outcome: Progressing Goal: Cardiovascular complication will be avoided Outcome: Progressing   Problem: Activity: Goal: Risk for activity intolerance will decrease Outcome: Progressing   Problem: Coping: Goal: Level of anxiety will decrease Outcome: Progressing   Problem: Elimination: Goal: Will not experience complications related to bowel motility Outcome: Progressing Goal: Will not experience complications related to urinary retention Outcome: Progressing   Problem: Pain Managment: Goal: General experience of comfort will improve Outcome: Progressing   Problem: Safety: Goal: Ability to remain free from injury will improve Outcome: Progressing   Problem: Skin Integrity: Goal: Risk for impaired skin integrity will decrease Outcome: Progressing   Problem: Cardiac: Goal: Ability to achieve and maintain adequate cardiopulmonary perfusion will improve Outcome: Progressing   Problem: Safety: Goal: Non-violent Restraint(s) Outcome: Progressing   Problem: Education: Goal: Knowledge of risk factors and measures for prevention of condition will improve Outcome: Progressing   Problem: Coping: Goal: Psychosocial and spiritual needs will be supported Outcome: Progressing   Problem: Respiratory: Goal: Will maintain a patent airway Outcome: Progressing Goal: Complications related to the disease process, condition or treatment will be avoided or minimized Outcome: Progressing

## 2021-06-01 NOTE — Progress Notes (Signed)
End of shift 06/01/21  Pt Alert and oriented to self.  Difficult to understand but probably because of her missing teeth.    Pt started the shift with a sitter and now has a Corporate investment banker.  Daughter is present.  Pt's neck is in an awkward position and there is a concern that pt may aspirate on food/liquid/meds etc.  Dr advised and speech consulted.  Pt is npo at this time.  CT ordered for spine and head.   Pt has waist restraint -  Posey and a mit on her left hand as pt had been trying to get out of bed.  Pt was combative at beginning of shift but is calm now.    Pt receiving IV abx.   Pt admitted for covid and was on 2L [place] but is now on room air and O2 sats are in the mid 90s.

## 2021-06-02 DIAGNOSIS — J810 Acute pulmonary edema: Secondary | ICD-10-CM | POA: Diagnosis not present

## 2021-06-02 DIAGNOSIS — J9601 Acute respiratory failure with hypoxia: Secondary | ICD-10-CM | POA: Diagnosis not present

## 2021-06-02 DIAGNOSIS — N184 Chronic kidney disease, stage 4 (severe): Secondary | ICD-10-CM | POA: Diagnosis not present

## 2021-06-02 LAB — COMPREHENSIVE METABOLIC PANEL
ALT: 48 U/L — ABNORMAL HIGH (ref 0–44)
AST: 64 U/L — ABNORMAL HIGH (ref 15–41)
Albumin: 4.3 g/dL (ref 3.5–5.0)
Alkaline Phosphatase: 91 U/L (ref 38–126)
Anion gap: 16 — ABNORMAL HIGH (ref 5–15)
BUN: 91 mg/dL — ABNORMAL HIGH (ref 8–23)
CO2: 25 mmol/L (ref 22–32)
Calcium: 9.7 mg/dL (ref 8.9–10.3)
Chloride: 109 mmol/L (ref 98–111)
Creatinine, Ser: 2.12 mg/dL — ABNORMAL HIGH (ref 0.44–1.00)
GFR, Estimated: 22 mL/min — ABNORMAL LOW (ref >60)
Glucose, Bld: 140 mg/dL — ABNORMAL HIGH (ref 70–99)
Potassium: 3.4 mmol/L — ABNORMAL LOW (ref 3.5–5.1)
Sodium: 150 mmol/L — ABNORMAL HIGH (ref 135–145)
Total Bilirubin: 0.7 mg/dL (ref 0.3–1.2)
Total Protein: 7.9 g/dL (ref 6.5–8.1)

## 2021-06-02 LAB — CBC WITH DIFFERENTIAL/PLATELET
Abs Immature Granulocytes: 0.13 10*3/uL — ABNORMAL HIGH (ref 0.00–0.07)
Basophils Absolute: 0 10*3/uL (ref 0.0–0.1)
Basophils Relative: 0 %
Eosinophils Absolute: 0 10*3/uL (ref 0.0–0.5)
Eosinophils Relative: 0 %
HCT: 38.4 % (ref 36.0–46.0)
Hemoglobin: 12.3 g/dL (ref 12.0–15.0)
Immature Granulocytes: 1 %
Lymphocytes Relative: 7 %
Lymphs Abs: 0.8 10*3/uL (ref 0.7–4.0)
MCH: 30.6 pg (ref 26.0–34.0)
MCHC: 32 g/dL (ref 30.0–36.0)
MCV: 95.5 fL (ref 80.0–100.0)
Monocytes Absolute: 1.4 10*3/uL — ABNORMAL HIGH (ref 0.1–1.0)
Monocytes Relative: 11 %
Neutro Abs: 9.9 10*3/uL — ABNORMAL HIGH (ref 1.7–7.7)
Neutrophils Relative %: 81 %
Platelets: 193 10*3/uL (ref 150–400)
RBC: 4.02 MIL/uL (ref 3.87–5.11)
RDW: 15.5 % (ref 11.5–15.5)
WBC: 12.2 10*3/uL — ABNORMAL HIGH (ref 4.0–10.5)
nRBC: 0.2 % (ref 0.0–0.2)

## 2021-06-02 LAB — GLUCOSE, CAPILLARY: Glucose-Capillary: 128 mg/dL — ABNORMAL HIGH (ref 70–99)

## 2021-06-02 LAB — C-REACTIVE PROTEIN: CRP: 2.5 mg/dL — ABNORMAL HIGH (ref <1.0)

## 2021-06-02 LAB — PROCALCITONIN: Procalcitonin: 0.24 ng/mL

## 2021-06-02 LAB — D-DIMER, QUANTITATIVE: D-Dimer, Quant: 1.41 ug/mL-FEU — ABNORMAL HIGH (ref 0.00–0.50)

## 2021-06-02 MED ORDER — DEXTROSE-NACL 5-0.9 % IV SOLN: INTRAVENOUS | Status: DC

## 2021-06-02 NOTE — Plan of Care (Addendum)
Pt continues to be restless, easily agitated, and hallucinating. Daughter at bedside at beginning of shift says that her mother's posture is a new development. Says her mother stated she is "watching tv on the ceiling". Pt currently in a.fib with some episodic tachycardia. Pt swallowing status is of concern, pending swallow eval today. Given a bath this shift. Continues with telesitter and lap belt. PureWick in place, voiding without difficulty.

## 2021-06-02 NOTE — Progress Notes (Addendum)
End of shift  Speech evaluated pt today and agreed with NPO.  Pt has dried oral secretions that are difficult to remove from mouth.    Pt received a bath today, no BM, purwick in place.  Dr d/c'd lasix and added D5 with NS.    Pt slept today.  Pt's iv in the RFA infiltrated and pt has a limb alert on left arm.  IV team consulted.  The RN said that she was unable to get a PIV but if the Dr wanted a midline, she felt that she could get a brachial midline to call back.  Dr ordered midline.  ABX not give because loss of IV.  Daughter [name] called and given update for the day.

## 2021-06-02 NOTE — Progress Notes (Signed)
PROGRESS NOTE    Rhonda Sharp  GDJ:[id number] DOB: 23-Dec-1928 DOA: 05/16/2021 PCP: [name], DO    Brief Narrative:  88 y.o. female with a history of chronic diastolic heart failure, DVT not on anticoagulation, breast cancer in remission, hypertension, CKD. Patient presented secondary to shortness of breath with evidence of acute respiratory from heart failure and COVID-19 pneumonia. Patient started on IV Lasix with improvement. Remdesivir IV and Decadron started for treatment of COVID-19 pneumonia with hypoxia. Hospitalization complicated by delirium.  Assessment & Plan:   Principal Problem:   Acute respiratory failure with hypoxia (HCC) Active Problems:   Essential hypertension   CKD (chronic kidney disease), stage IV (HCC)   Pneumonia due to COVID-19 virus   Acute on chronic diastolic CHF (congestive heart failure) (HCC)   Acute respiratory failure with hypoxemia (HCC)   Delirium  * Acute respiratory failure with hypoxia ([place])- (present on admission) Likely multifactorial in setting of COVID-19 pneumonia and pulmonary edema. Patient initially required BiPAP with oxygen requirement significantly improved after Lasix IV. -Now clinically dehydrated, thus lasix discontinued -Now weaned to room air   Delirium In setting of underlying dementia. TSH normal. -Delirium precautions -CT head/neck reviewed, neg -Vitamin B1/B12, Folate, ammonia unremarkable -Blood cultures neg thus far -Sitter -Did not pass SLP. Recs for NPO status. Given severity of illness, will consult Palliative Care   Acute on chronic diastolic CHF (congestive heart failure) ([place])- (present on admission) Pulmonary edema suggested on chest x-ray. Given Lasix IV with initial improvement of hypoxia.  -Now clinically dehydrated with ARF, hypernatremia, dry mucus membranes, thus have discontinued further lasix   Pneumonia due to COVID-19 virus Associated multifocal infiltrates concerning for possible  infection vs edema. Associated hypoxia. Patient started on Remdesivir, Ceftriaxone, Azithromycin and Decadron IV. Procalcitonin of 0.16 > 0.24. -Continue Remdesivir IV and Decadron IV -Continue Ceftriaxone and azithromycin for now   Acute on CKD (chronic kidney disease), stage IV ([place])- (present on admission) Baseline creatinine of about 2.  Clinically dehydrated with dry mucus membranes  Hold further lasix Start gentle D5 NS Repeat bmet in AM   Essential hypertension- (present on admission) -hydralazine, amlodipine and nebivolol now on hold secondary to soft BP -Continue hyralazine prn  Dehydration -Clinically dry on exam  -Gentle IVF ordered    DVT prophylaxis: Lovenox subq Code Status: DNR Family Communication: Pt in room, family not at bedside  Status is: Inpatient  Remains inpatient appropriate because: Severity of illness   Consultants:  Palliative Care  Procedures:    Antimicrobials: Anti-infectives (From admission, onward)    Start     Dose/Rate Route Frequency Ordered Stop   06/01/21 1600  cefTRIAXone (ROCEPHIN) 1 g in sodium chloride 0.9 % 100 mL IVPB        1 g 200 mL/hr over 30 Minutes Intravenous Every 24 hours 06/01/21 1016     05/31/21 1700  azithromycin (ZITHROMAX) 500 mg in sodium chloride 0.9 % 250 mL IVPB        500 mg 250 mL/hr over 60 Minutes Intravenous Every 24 hours 05/31/21 0528     05/31/21 1600  cefTRIAXone (ROCEPHIN) 2 g in sodium chloride 0.9 % 100 mL IVPB  Status:  Discontinued        2 g 200 mL/hr over 30 Minutes Intravenous Every 24 hours 05/31/21 0528 06/01/21 1016   05/31/21 1000  remdesivir 100 mg in sodium chloride 0.9 % 100 mL IVPB        100 mg 200 mL/hr over 30 Minutes  Intravenous Daily 05/18/2021 1515 07/04/21 0959   06/08/2021 1615  cefTRIAXone (ROCEPHIN) 1 g in sodium chloride 0.9 % 100 mL IVPB        1 g 200 mL/hr over 30 Minutes Intravenous  Once 06/06/2021 1603 06/08/2021 1720   05/15/2021 1615  azithromycin (ZITHROMAX) 500 mg in  sodium chloride 0.9 % 250 mL IVPB        500 mg 250 mL/hr over 60 Minutes Intravenous  Once 05/16/2021 1603 05/20/2021 1852   05/09/2021 1600  remdesivir 100 mg in sodium chloride 0.9 % 100 mL IVPB       See Hyperspace for full Linked Orders Report.   100 mg 200 mL/hr over 30 Minutes Intravenous Once 05/17/2021 1515 05/12/2021 1644   05/25/2021 1515  remdesivir 100 mg in sodium chloride 0.9 % 100 mL IVPB       See Hyperspace for full Linked Orders Report.   100 mg 200 mL/hr over 30 Minutes Intravenous Once 05/15/2021 1515 05/27/2021 1610       Subjective: Unable to assess given mentation  Objective: Vitals:   06/01/21 1331 06/01/21 2051 06/02/21 0510 06/02/21 0517  BP: 122/87 128/63 97/62   Pulse: 84 74 79   Resp: 16 17 20    Temp: 98.2 F (36.8 C) 99.1 F (37.3 C) 98.6 F (37 C)   TempSrc: Oral Oral Oral   SpO2: 95% 95% 93%   Weight:    58.7 kg  Height:        Intake/Output Summary (Last 24 hours) at 06/02/2021 1533 Last data filed at 06/02/2021 5852 Gross per 24 hour  Intake 518.72 ml  Output 1350 ml  Net -831.28 ml   Filed Weights   05/31/21 0611 06/01/21 0500 06/02/21 0517  Weight: 65.5 kg 65.5 kg 58.7 kg    Examination: General exam: Asleep, laying in bed, in nad, dry mucus membranes Respiratory system: Normal respiratory effort, no wheezing Cardiovascular system: regular rate, s1, s2 Gastrointestinal system: Soft, nondistended, positive BS Central nervous system: CN2-12 grossly intact, strength intact Extremities: Perfused, no clubbing Skin: Normal skin turgor, no notable skin lesions seen Psychiatry: Unable to assess given mentation  Data Reviewed: I have personally reviewed following labs and imaging studies  CBC: Recent Labs  Lab 05/24/2021 1347 05/17/2021 1556 06/08/2021 2214 05/31/21 0531 06/01/21 0358 06/02/21 0352  WBC 13.2*  --  8.0 7.4 7.2 12.2*  NEUTROABS  --   --   --  5.9 5.3 9.9*  HGB 10.1* 11.2* 10.6* 10.8* 11.1* 12.3  HCT 32.2* 33.0* 33.1* 34.5* 33.8*  38.4  MCV 95.3  --  94.8 95.8 94.9 95.5  PLT 130*  --  129* 152 144* 778   Basic Metabolic Panel: Recent Labs  Lab 05/14/2021 1347 05/24/2021 1556 06/08/2021 2214 05/31/21 0531 06/01/21 0358 06/02/21 0352  NA 142 143  --  141 143 150*  K 4.3 4.1  --  4.2 3.6 3.4*  CL 107  --   --  106 107 109  CO2 23  --   --  21* 23 25  GLUCOSE 152*  --   --  140* 123* 140*  BUN 45*  --   --  51* 67* 91*  CREATININE 1.71*  --  1.65* 1.90* 1.89* 2.12*  CALCIUM 9.6  --   --  9.7 9.5 9.7  MG  --   --   --  2.1  --   --    GFR: Estimated Creatinine Clearance: 14.6 mL/min (A) (by C-G formula  based on SCr of 2.12 mg/dL (H)). Liver Function Tests: Recent Labs  Lab 05/31/21 0531 06/01/21 0358 06/02/21 0352  AST 73* 67* 64*  ALT 56* 47* 48*  ALKPHOS 118 91 91  BILITOT 0.9 0.6 0.7  PROT 7.9 7.2 7.9  ALBUMIN 4.3 3.8 4.3   No results for input(s): LIPASE, AMYLASE in the last 168 hours. Recent Labs  Lab 06/01/21 1502  AMMONIA 17   Coagulation Profile: No results for input(s): INR, PROTIME in the last 168 hours. Cardiac Enzymes: No results for input(s): CKTOTAL, CKMB, CKMBINDEX, TROPONINI in the last 168 hours. BNP (last 3 results) No results for input(s): PROBNP in the last 8760 hours. HbA1C: No results for input(s): HGBA1C in the last 72 hours. CBG: No results for input(s): GLUCAP in the last 168 hours. Lipid Profile: No results for input(s): CHOL, HDL, LDLCALC, TRIG, CHOLHDL, LDLDIRECT in the last 72 hours. Thyroid Function Tests: Recent Labs    05/31/21 0531  TSH 0.423   Anemia Panel: Recent Labs    06/01/21 1502  VITAMINB12 527  FOLATE 10.3   Sepsis Labs: Recent Labs  Lab 06/02/2021 2214 06/01/21 0358 06/02/21 0352  PROCALCITON 0.16 0.24 0.24    Recent Results (from the past 240 hour(s))  Resp Panel by RT-PCR (Flu A&B, Covid) Nasopharyngeal Swab     Status: Abnormal   Collection Time: 05/23/2021  1:47 PM   Specimen: Nasopharyngeal Swab; Nasopharyngeal(NP) swabs in vial  transport medium  Result Value Ref Range Status   SARS Coronavirus 2 by RT PCR POSITIVE (A) NEGATIVE Final    Comment: (NOTE) SARS-CoV-2 target nucleic acids are DETECTED.  The SARS-CoV-2 RNA is generally detectable in upper respiratory specimens during the acute phase of infection. Positive results are indicative of the presence of the identified virus, but do not rule out bacterial infection or co-infection with other pathogens not detected by the test. Clinical correlation with patient history and other diagnostic information is necessary to determine patient infection status. The expected result is Negative.  Fact Sheet for Patients: EntrepreneurPulse.com.au  Fact Sheet for Healthcare Providers: IncredibleEmployment.be  This test is not yet approved or cleared by the [place] FDA and  has been authorized for detection and/or diagnosis of SARS-CoV-2 by FDA under an Emergency Use Authorization (EUA).  This EUA will remain in effect (meaning this test can be used) for the duration of  the COVID-19 declaration under Section 564(b)(1) of the A ct, 21 U.S.C. section 360bbb-3(b)(1), unless the authorization is terminated or revoked sooner.     Influenza A by PCR NEGATIVE NEGATIVE Final   Influenza B by PCR NEGATIVE NEGATIVE Final    Comment: (NOTE) The Xpert Xpress SARS-CoV-2/FLU/RSV plus assay is intended as an aid in the diagnosis of influenza from Nasopharyngeal swab specimens and should not be used as a sole basis for treatment. Nasal washings and aspirates are unacceptable for Xpert Xpress SARS-CoV-2/FLU/RSV testing.  Fact Sheet for Patients: EntrepreneurPulse.com.au  Fact Sheet for Healthcare Providers: IncredibleEmployment.be  This test is not yet approved or cleared by the [place] FDA and has been authorized for detection and/or diagnosis of SARS-CoV-2 by FDA under an Emergency Use  Authorization (EUA). This EUA will remain in effect (meaning this test can be used) for the duration of the COVID-19 declaration under Section 564(b)(1) of the Act, 21 U.S.C. section 360bbb-3(b)(1), unless the authorization is terminated or revoked.  Performed at [facility], [address] [place], [place] [address]   MRSA Next Gen by PCR,  Nasal     Status: None   Collection Time: 06/07/2021  8:09 PM   Specimen: Nasal Mucosa; Nasal Swab  Result Value Ref Range Status   MRSA by PCR Next Gen NOT DETECTED NOT DETECTED Final    Comment: (NOTE) The GeneXpert MRSA Assay (FDA approved for NASAL specimens only), is one component of a comprehensive MRSA colonization surveillance program. It is not intended to diagnose MRSA infection nor to guide or monitor treatment for MRSA infections. Test performance is not FDA approved in patients less than 92 years old. Performed at [facility], [place] [address] [place], [place] [address]   Culture, blood (routine x 2)     Status: None (Preliminary result)   Collection Time: 06/01/21  3:02 PM   Specimen: BLOOD RIGHT WRIST  Result Value Ref Range Status   Specimen Description   Final    BLOOD RIGHT WRIST Performed at [facility] [address], [place], [place] [address]    Special Requests   Final    BOTTLES DRAWN AEROBIC AND ANAEROBIC Blood Culture adequate volume Performed at [facility] [address] [place], [place] [address]    Culture   Final    NO GROWTH < 12 HOURS Performed at [facility] [address], [place], [place] [address]    Report Status PENDING  Incomplete  Culture, blood (routine x 2)     Status: None (Preliminary result)   Collection Time: 06/01/21  3:02 PM   Specimen: BLOOD  Result Value Ref Range Status   Specimen Description   Final    BLOOD BLOOD RIGHT HAND Performed at [facility] [address]  [place], [place] [address]    Special Requests   Final    BOTTLES DRAWN AEROBIC AND ANAEROBIC Blood Culture results may not be optimal due to an inadequate volume of blood received in culture bottles Performed at [facility] [address], [place], [place] [address]    Culture   Final    NO GROWTH < 12 HOURS Performed at [facility] [address], [place], [place] [address]    Report Status PENDING  Incomplete     Radiology Studies: CT HEAD WO CONTRAST (5MM)  Result Date: 06/01/2021 CLINICAL DATA:  Mental status change, unknown cause EXAM: CT HEAD WITHOUT CONTRAST TECHNIQUE: Contiguous axial images were obtained from the base of the skull through the vertex without intravenous contrast. RADIATION DOSE REDUCTION: This exam was performed according to the departmental dose-optimization program which includes automated exposure control, adjustment of the mA and/or kV according to patient size and/or use of iterative reconstruction technique. COMPARISON:  05/13/2021 FINDINGS: Brain: There is atrophy and chronic small vessel disease changes. No acute intracranial abnormality. Specifically, no hemorrhage, hydrocephalus, mass lesion, acute infarction, or significant intracranial injury. Vascular: No hyperdense vessel or unexpected calcification. Skull: No acute calvarial abnormality. Sinuses/Orbits: No acute findings Other: None IMPRESSION: Atrophy, chronic microvascular disease. No acute intracranial abnormality. Electronically Signed   By: Rolm Baptise M.D.   On: 06/01/2021 18:01   CT CERVICAL SPINE WO CONTRAST  Result Date: 06/01/2021 CLINICAL DATA:  Neck pain, acute, no red flags EXAM: CT CERVICAL SPINE WITHOUT CONTRAST TECHNIQUE: Multidetector CT imaging of the cervical spine was performed without intravenous contrast. Multiplanar CT image reconstructions were also generated. RADIATION DOSE REDUCTION: This exam was performed according to the departmental dose-optimization  program which includes automated exposure control, adjustment of the  mA and/or kV according to patient size and/or use of iterative reconstruction technique. COMPARISON:  None. FINDINGS: Alignment: Normal Skull base and vertebrae: No acute fracture. No primary bone lesion or focal pathologic process. Soft tissues and spinal canal: No prevertebral fluid or swelling. No visible canal hematoma. Disc levels: Diffuse degenerative disc disease, most pronounced at C5-6 and C6-7. Moderate diffuse degenerative facet disease, most pronounced in the lower cervical spine. Bilateral neural foraminal narrowing at C5-6. Upper chest: No acute findings Other: None IMPRESSION: Degenerative disc and facet disease. No acute bony abnormality. Electronically Signed   By: Rolm Baptise M.D.   On: 06/01/2021 18:03    Scheduled Meds:  allopurinol  100 mg Oral Daily   aspirin EC  81 mg Oral Daily   Chlorhexidine Gluconate Cloth  6 each Topical Daily   dexamethasone (DECADRON) injection  6 mg Intravenous Q24H   enoxaparin (LOVENOX) injection  30 mg Subcutaneous Q24H   loratadine  10 mg Oral Daily   mouth rinse  15 mL Mouth Rinse BID   nebivolol  2.5 mg Oral Daily   Continuous Infusions:  sodium chloride 10 mL/hr (06/02/21 1037)   azithromycin Stopped (06/01/21 1800)   cefTRIAXone (ROCEPHIN)  IV Stopped (06/01/21 1800)   dextrose 5 % and 0.9% NaCl 75 mL/hr at 06/02/21 1155   remdesivir 100 mg in NS 100 mL 100 mg (06/02/21 1039)     LOS: 3 days   Marylu Lund, MD [facility] Pager On Amion  If 7PM-7AM, please contact night-coverage 06/02/2021, 3:33 PM

## 2021-06-02 NOTE — Evaluation (Signed)
Clinical/Bedside Swallow Evaluation Patient Details  Name: Rhonda Sharp MRN: [id number] Date of Birth: Jun 02, 1928  Today's Date: 06/02/2021 Time: SLP Start Time (ACUTE ONLY): 0910 SLP Stop Time (ACUTE ONLY): 0935 SLP Time Calculation (min) (ACUTE ONLY): 25 min  Past Medical History:  Past Medical History:  Diagnosis Date   Breast cancer, left breast ([place]) ~ 2002   "had radiation"   Glaucoma    History of gout    Hypertension    Migraine    "used to get them; nothing recently" (05/22/2018)   Retinal hemorrhage    Stroke [name]) 2001   daughter denies residual on 05/22/2018   Past Surgical History:  Past Surgical History:  Procedure Laterality Date   APPENDECTOMY     AXILLARY LYMPH NODE DISSECTION Left ~ 2002   BREAST BIOPSY Left ~ 2002   EYE SURGERY Left 05/2018   "laser OR for bleed"   TUBAL LIGATION     HPI:  Rhonda Sharp is a 88 yo female adm to [facility] with AMS and SOB, found to have [place].   Pt PMH + for dementia, ETOH use, HTN, CHF, CVA, Diffuse degenerative disc disease, most pronounced at  C5-6 and C67, prior posterior left temporal cortex CVA,  CXR : Cardiomegaly with mild, diffuse bilateral interstitial pulmonary  opacity, consistent with edema or infection. No focal airspace opacity.  RN concerned re: pt's swallowing ability was obtained yesterday. Pt's hospital coarse has been complicated by delirium.  RN reports pt with dried secretions yesterday despite frequent oral care being provided.    Assessment / Plan / Recommendation  Clinical Impression  Pt lethargic but did awaken slightly for SLP to provide oral care with RN assist - Bed placed in reverse trendelenburg for optimal airway protection.  Dried secretions noted posteiror on soft palate that SLP attempted to remove with moist toothette and oral suction.  Pt slighly resistant to oral care - and only minimal amount removed.  Pt able to answer yes/no questions re: her immediate care with total cues.  At this time, any  intake will be aspirated and will not provide comfort for this pt. Recommend strict NPO, ? need to consider NTS if pt has respiratory issues with secretions and consider palliative consult.  Unfortunately pt's prognosis for swallow function to improve is poor given current status and RN report from prior date.  Will follow up x1 if indicated for education re: "comfort po" Md indicates pt becomes appropriate for this plan.  Spoke to RN and requested pain remain upright with head in neutral position as much as able due to concern for secretion retention.  No family present to inform at this time. SLP Visit Diagnosis: Dysphagia, oropharyngeal phase (R13.12)    Aspiration Risk  Severe aspiration risk;Risk for inadequate nutrition/hydration    Diet Recommendation NPO (judicious oral care, ? NTS?)   Medication Administration: Via alternative means    Other  Recommendations Oral Care Recommendations: Oral care QID    Recommendations for follow up therapy are one component of a multi-disciplinary discharge planning process, led by the attending physician.  Recommendations may be updated based on patient status, additional functional criteria and insurance authorization.  Follow up Recommendations No SLP follow up      Assistance Recommended at Discharge Frequent or constant Supervision/Assistance  Functional Status Assessment Patient has had a recent decline in their functional status and/or demonstrates limited ability to make significant improvements in function in a reasonable and predictable amount of time  Frequency and Duration min  1 x/week  1 week       Prognosis Prognosis for Safe Diet Advancement: Guarded Barriers to Reach Goals: Severity of deficits      Swallow Study   General Date of Onset: 06/02/21 HPI: Rhonda Sharp is a 88 yo female adm to [facility] with AMS and SOB, found to have COVID.   Pt PMH + for dementia, ETOH use, HTN, CHF, CVA, Diffuse degenerative disc disease, most pronounced  at  C5-6 and C67, prior posterior left temporal cortex CVA,  CXR : Cardiomegaly with mild, diffuse bilateral interstitial pulmonary  opacity, consistent with edema or infection. No focal airspace opacity.  RN concerned re: pt's swallowing ability was obtained yesterday. Pt's hospital coarse has been complicated by delirium.  RN reports pt with dried secretions yesterday despite frequent oral care being provided. Type of Study: Bedside Swallow Evaluation Diet Prior to this Study: Regular;Thin liquids Temperature Spikes Noted: No Respiratory Status: Room air History of Recent Intubation: No Behavior/Cognition: Lethargic/Drowsy;Doesn't follow directions Oral Cavity Assessment: Dried secretions Oral Care Completed by SLP: Yes (extensive with oral suction and water via toothette to loosen dried secretions) Oral Cavity - Dentition: Adequate natural dentition Vision: Impaired for self-feeding Self-Feeding Abilities: Refused PO Patient Positioning: Other (comment) (pt placed in reverse trendelenburg for optimal oral care/airway protection given her neck extension) Baseline Vocal Quality: Low vocal intensity;Hoarse Volitional Cough: Cognitively unable to elicit (weak reflexive cough present) Volitional Swallow: Unable to elicit    Oral/Motor/Sensory Function Overall Oral Motor/Sensory Function: Generalized oral weakness (pt able to seal lips on oral suction catheter when she desires, inconsistently produced)   Ice Chips Ice chips: Not tested   Thin Liquid Thin Liquid: Not tested    Nectar Thick Nectar Thick Liquid: Not tested   Honey Thick Honey Thick Liquid: Not tested   Puree Puree: Not tested   Solid     Solid: Not tested      Macario Golds 06/02/2021,10:29 AM   Kathleen Lime, [place] Office [phone] Cell [phone]

## 2021-06-03 DIAGNOSIS — J810 Acute pulmonary edema: Secondary | ICD-10-CM | POA: Diagnosis not present

## 2021-06-03 DIAGNOSIS — N184 Chronic kidney disease, stage 4 (severe): Secondary | ICD-10-CM | POA: Diagnosis not present

## 2021-06-03 DIAGNOSIS — J9601 Acute respiratory failure with hypoxia: Secondary | ICD-10-CM | POA: Diagnosis not present

## 2021-06-03 LAB — CBC WITH DIFFERENTIAL/PLATELET
Abs Immature Granulocytes: 0.21 10*3/uL — ABNORMAL HIGH (ref 0.00–0.07)
Basophils Absolute: 0 10*3/uL (ref 0.0–0.1)
Basophils Relative: 0 %
Eosinophils Absolute: 0 10*3/uL (ref 0.0–0.5)
Eosinophils Relative: 0 %
HCT: 38.5 % (ref 36.0–46.0)
Hemoglobin: 12.2 g/dL (ref 12.0–15.0)
Immature Granulocytes: 2 %
Lymphocytes Relative: 8 %
Lymphs Abs: 0.9 10*3/uL (ref 0.7–4.0)
MCH: 30.7 pg (ref 26.0–34.0)
MCHC: 31.7 g/dL (ref 30.0–36.0)
MCV: 96.7 fL (ref 80.0–100.0)
Monocytes Absolute: 1.1 10*3/uL — ABNORMAL HIGH (ref 0.1–1.0)
Monocytes Relative: 10 %
Neutro Abs: 8.7 10*3/uL — ABNORMAL HIGH (ref 1.7–7.7)
Neutrophils Relative %: 80 %
Platelets: 182 10*3/uL (ref 150–400)
RBC: 3.98 MIL/uL (ref 3.87–5.11)
RDW: 15.6 % — ABNORMAL HIGH (ref 11.5–15.5)
WBC: 10.9 10*3/uL — ABNORMAL HIGH (ref 4.0–10.5)
nRBC: 0 % (ref 0.0–0.2)

## 2021-06-03 LAB — BASIC METABOLIC PANEL
Anion gap: 13 (ref 5–15)
BUN: 112 mg/dL — ABNORMAL HIGH (ref 8–23)
CO2: 23 mmol/L (ref 22–32)
Calcium: 9.7 mg/dL (ref 8.9–10.3)
Chloride: 119 mmol/L — ABNORMAL HIGH (ref 98–111)
Creatinine, Ser: 2.28 mg/dL — ABNORMAL HIGH (ref 0.44–1.00)
GFR, Estimated: 20 mL/min — ABNORMAL LOW (ref >60)
Glucose, Bld: 175 mg/dL — ABNORMAL HIGH (ref 70–99)
Potassium: 3.9 mmol/L (ref 3.5–5.1)
Sodium: 155 mmol/L — ABNORMAL HIGH (ref 135–145)

## 2021-06-03 LAB — COMPREHENSIVE METABOLIC PANEL
ALT: 39 U/L (ref 0–44)
AST: 46 U/L — ABNORMAL HIGH (ref 15–41)
Albumin: 4 g/dL (ref 3.5–5.0)
Alkaline Phosphatase: 85 U/L (ref 38–126)
Anion gap: 14 (ref 5–15)
BUN: 106 mg/dL — ABNORMAL HIGH (ref 8–23)
CO2: 25 mmol/L (ref 22–32)
Calcium: 9.6 mg/dL (ref 8.9–10.3)
Chloride: 118 mmol/L — ABNORMAL HIGH (ref 98–111)
Creatinine, Ser: 2.19 mg/dL — ABNORMAL HIGH (ref 0.44–1.00)
GFR, Estimated: 21 mL/min — ABNORMAL LOW (ref >60)
Glucose, Bld: 180 mg/dL — ABNORMAL HIGH (ref 70–99)
Potassium: 3.3 mmol/L — ABNORMAL LOW (ref 3.5–5.1)
Sodium: 157 mmol/L — ABNORMAL HIGH (ref 135–145)
Total Bilirubin: 0.6 mg/dL (ref 0.3–1.2)
Total Protein: 7.6 g/dL (ref 6.5–8.1)

## 2021-06-03 LAB — C-REACTIVE PROTEIN: CRP: 1.7 mg/dL — ABNORMAL HIGH (ref <1.0)

## 2021-06-03 LAB — D-DIMER, QUANTITATIVE: D-Dimer, Quant: 1.06 ug/mL-FEU — ABNORMAL HIGH (ref 0.00–0.50)

## 2021-06-03 MED ORDER — DEXTROSE 5 % IV SOLN
INTRAVENOUS | Status: DC
Start: 2021-06-03 — End: 2021-06-04

## 2021-06-03 MED ORDER — POTASSIUM CHLORIDE 10 MEQ/100ML IV SOLN
10.0000 meq | INTRAVENOUS | Status: AC
  Administered 2021-06-03 (×3): 10 meq via INTRAVENOUS
  Filled 2021-06-03 (×3): qty 100

## 2021-06-03 NOTE — Progress Notes (Signed)
Speech Language Pathology Treatment: Dysphagia  Patient Details Name: Rhonda Sharp MRN: [id number] DOB: [birth year] Today's Date: 06/03/2021 Time: 8887-5797 SLP Time Calculation (min) (ACUTE ONLY): 24 min  Assessment / Plan / Recommendation Clinical Impression  SLP follow up as RN reports pt with improved mentation. She maintains neck extension posture and daughter present. SLP helped reposition pt with daughter's assistance - as pt was slightly leaning the right - Pt did open her eyes and verbalize "hello" to SLP and "Yes" and "all over" when asked re: pain.  Unfortunately she did not maintain alertness - and quickly resumed snoring - closing eyes.  She also presented with occasional muscle twitches during session of her arm and leg.  SLP checked telemonitor and pt HR was low 100s with 100% oxygen saturation.  Oral cavity continues to be xerostomic with dried secretions present. Posterior oral cavity on left appeared to have minimal healing abrasion, ? due to suctioning.  Discussed with pt's daughter, pt's lack of readiness for po intake at this time.  Oral care indicated to maximize pt's comfort and for pulmonary hygiene.  Daughter in agreement with plan and reports pt has been "this way" since Saturday.   [name] denies pt having any dysphagia PTA.  Unfortunately, SLP does not anticipate pt to have improvement in swallownig over this hospital coarse and po likely will not provide her comfort at this time.  Will sign off given pt's lack of improvement.  Pt verbalized "yes" "all over" re: discomfort, no overt s/s of pain - asked RN about heat pack for pt's neck - which she took to pt's daughter, no goals verbalized, but daughter stated "She's 88, ya know?" Thanks.   HPI HPI: Rhonda Sharp is a 88 yo female adm to [facility] with AMS and SOB, found to have COVID.   Pt PMH + for dementia, ETOH use, HTN, CHF, CVA, Diffuse degenerative disc disease, most pronounced at  C5-6 and C67, prior posterior left temporal  cortex CVA,  CXR : Cardiomegaly with mild, diffuse bilateral interstitial pulmonary  opacity, consistent with edema or infection. No focal airspace opacity.  RN concerned re: pt's swallowing ability was obtained yesterday. Pt's hospital coarse has been complicated by delirium.  RN reports pt with dried secretions yesterday despite frequent oral care being provided. Today RN reports pt with some improved mentation - and thus SLP followed up with pt for po readiness.      SLP Plan  Discharge SLP treatment due to (comment) (lack of functional improvement)      Recommendations for follow up therapy are one component of a multi-disciplinary discharge planning process, led by the attending physician.  Recommendations may be updated based on patient status, additional functional criteria and insurance authorization.    Recommendations  Diet recommendations: NPO;Other(comment) (oral moisture) Medication Administration: Via alternative means                Oral Care Recommendations: Oral care QID Follow Up Recommendations: No SLP follow up Assistance recommended at discharge: Frequent or constant Supervision/Assistance SLP Visit Diagnosis: Dysphagia, oropharyngeal phase (R13.12) Plan: Discharge SLP treatment due to (comment) (lack of functional improvement)         Kathleen Lime, MS [place] Office [phone] Cell [phone]   Macario Golds  06/03/2021, 3:05 PM

## 2021-06-03 NOTE — Progress Notes (Signed)
PROGRESS NOTE    Rhonda Sharp  OXB:[id number] DOB: 14-Feb-1929 DOA: 06/01/2021 PCP: [name], DO    Brief Narrative:  88 y.o. female with a history of chronic diastolic heart failure, DVT not on anticoagulation, breast cancer in remission, hypertension, CKD. Patient presented secondary to shortness of breath with evidence of acute respiratory from heart failure and COVID-19 pneumonia. Patient started on IV Lasix with improvement. Remdesivir IV and Decadron started for treatment of COVID-19 pneumonia with hypoxia. Hospitalization complicated by delirium.  Assessment & Plan:   Principal Problem:   Acute respiratory failure with hypoxia (HCC) Active Problems:   Essential hypertension   CKD (chronic kidney disease), stage IV (HCC)   Pneumonia due to COVID-19 virus   Acute on chronic diastolic CHF (congestive heart failure) (HCC)   Acute respiratory failure with hypoxemia (HCC)   Delirium  * Acute respiratory failure with hypoxia ([place])- (present on admission) Likely multifactorial in setting of COVID-19 pneumonia and pulmonary edema. Patient initially required BiPAP with oxygen requirement significantly improved after Lasix IV. -noted to be clinically dehydrated, thus lasix discontinued -Now weaned to room air   Delirium In setting of underlying dementia. TSH normal. -Delirium precautions -CT head/neck reviewed, neg -Vitamin B1/B12, Folate, ammonia unremarkable -Blood cultures neg thus far -Sitter -Did not pass SLP. Recs for NPO status. Given continued decline despite aggressive measures, had consulted Palliative Care   Acute on chronic diastolic CHF (congestive heart failure) ([place])- (present on admission) Pulmonary edema suggested on chest x-ray. Given Lasix IV with initial improvement of hypoxia.  -Now clinically dehydrated with ARF, hypernatremia, dry mucus membranes, thus have discontinued further lasix   Pneumonia due to COVID-19 virus Associated multifocal  infiltrates concerning for possible infection vs edema. Associated hypoxia. Patient started on Remdesivir, Ceftriaxone, Azithromycin and Decadron IV. Procalcitonin of 0.16 > 0.24. -Continue Remdesivir IV and Decadron IV -Continue Ceftriaxone and azithromycin for now   Acute on CKD (chronic kidney disease), stage IV ([place])- (present on admission) Baseline creatinine of about 2.  Clinically dehydrated with dry mucus membranes  Hold further lasix Cont on D5 fluids Renal function continues to worsen   Essential hypertension- (present on admission) -hydralazine, amlodipine and nebivolol now on hold secondary to soft BP -Continue hyralazine prn  Dehydration -Clinically dry on exam  -Gentle IVF ordered  Hypernatremia -Suspect secondary to dehydration -No improving slowly with IVF hydration    DVT prophylaxis: Lovenox subq Code Status: DNR Family Communication: Pt in room, family not at bedside  Status is: Inpatient  Remains inpatient appropriate because: Severity of illness   Consultants:  Palliative Care  Procedures:    Antimicrobials: Anti-infectives (From admission, onward)    Start     Dose/Rate Route Frequency Ordered Stop   06/01/21 1600  cefTRIAXone (ROCEPHIN) 1 g in sodium chloride 0.9 % 100 mL IVPB        1 g 200 mL/hr over 30 Minutes Intravenous Every 24 hours 06/01/21 1016 Jun 21, 2021 0200   05/31/21 1700  azithromycin (ZITHROMAX) 500 mg in sodium chloride 0.9 % 250 mL IVPB        500 mg 250 mL/hr over 60 Minutes Intravenous Every 24 hours 05/31/21 0528 21-Jun-2021 0200   05/31/21 1600  cefTRIAXone (ROCEPHIN) 2 g in sodium chloride 0.9 % 100 mL IVPB  Status:  Discontinued        2 g 200 mL/hr over 30 Minutes Intravenous Every 24 hours 05/31/21 0528 06/01/21 1016   05/31/21 1000  remdesivir 100 mg in sodium chloride 0.9 %  100 mL IVPB        100 mg 200 mL/hr over 30 Minutes Intravenous Daily 06/06/2021 1515 06/03/21 1013   05/25/2021 1615  cefTRIAXone (ROCEPHIN) 1 g in  sodium chloride 0.9 % 100 mL IVPB        1 g 200 mL/hr over 30 Minutes Intravenous  Once 05/11/2021 1603 05/21/2021 1720   05/15/2021 1615  azithromycin (ZITHROMAX) 500 mg in sodium chloride 0.9 % 250 mL IVPB        500 mg 250 mL/hr over 60 Minutes Intravenous  Once 06/02/2021 1603 05/31/2021 1852   05/15/2021 1600  remdesivir 100 mg in sodium chloride 0.9 % 100 mL IVPB       See Hyperspace for full Linked Orders Report.   100 mg 200 mL/hr over 30 Minutes Intravenous Once 05/20/2021 1515 05/19/2021 1644   06/07/2021 1515  remdesivir 100 mg in sodium chloride 0.9 % 100 mL IVPB       See Hyperspace for full Linked Orders Report.   100 mg 200 mL/hr over 30 Minutes Intravenous Once 05/17/2021 1515 06/03/2021 1610       Subjective: Cannot obtain secondary to mentation  Objective: Vitals:   06/03/21 0439 06/03/21 0937 06/03/21 0939 06/03/21 1441  BP: (!) 151/130  136/85 (!) 126/106  Pulse: (!) 109  (!) 103 67  Resp: 19   (!) 22  Temp: 97.8 F (36.6 C)   98.4 F (36.9 C)  TempSrc: Oral   Oral  SpO2: 91% 92% 93% 97%  Weight:      Height:        Intake/Output Summary (Last 24 hours) at 06/03/2021 1653 Last data filed at 06/03/2021 1400 Gross per 24 hour  Intake 1111.55 ml  Output 900 ml  Net 211.55 ml    Filed Weights   05/31/21 0611 06/01/21 0500 06/02/21 0517  Weight: 65.5 kg 65.5 kg 58.7 kg    Examination: General exam: Not conversant, in no acute distress Respiratory system: normal chest rise, clear, no audible wheezing Cardiovascular system: regular rhythm, s1-s2 Gastrointestinal system: Nondistended, nontender, pos BS Central nervous system: No seizures, no tremors Extremities: No cyanosis, no joint deformities Skin: No rashes, no pallor Psychiatry: unable to assess given mentation  Data Reviewed: I have personally reviewed following labs and imaging studies  CBC: Recent Labs  Lab 05/09/2021 2214 05/31/21 0531 06/01/21 0358 06/02/21 0352 06/03/21 0346  WBC 8.0 7.4 7.2 12.2*  10.9*  NEUTROABS  --  5.9 5.3 9.9* 8.7*  HGB 10.6* 10.8* 11.1* 12.3 12.2  HCT 33.1* 34.5* 33.8* 38.4 38.5  MCV 94.8 95.8 94.9 95.5 96.7  PLT 129* 152 144* 193 381    Basic Metabolic Panel: Recent Labs  Lab 05/31/21 0531 06/01/21 0358 06/02/21 0352 06/03/21 0346 06/03/21 1328  NA 141 143 150* 157* 155*  K 4.2 3.6 3.4* 3.3* 3.9  CL 106 107 109 118* 119*  CO2 21* 23 25 25 23   GLUCOSE 140* 123* 140* 180* 175*  BUN 51* 67* 91* 106* 112*  CREATININE 1.90* 1.89* 2.12* 2.19* 2.28*  CALCIUM 9.7 9.5 9.7 9.6 9.7  MG 2.1  --   --   --   --     GFR: Estimated Creatinine Clearance: 13.6 mL/min (A) (by C-G formula based on SCr of 2.28 mg/dL (H)). Liver Function Tests: Recent Labs  Lab 05/31/21 0531 06/01/21 0358 06/02/21 0352 06/03/21 0346  AST 73* 67* 64* 46*  ALT 56* 47* 48* 39  ALKPHOS 118 91 91 85  BILITOT 0.9 0.6 0.7 0.6  PROT 7.9 7.2 7.9 7.6  ALBUMIN 4.3 3.8 4.3 4.0    No results for input(s): LIPASE, AMYLASE in the last 168 hours. Recent Labs  Lab 06/01/21 1502  AMMONIA 17    Coagulation Profile: No results for input(s): INR, PROTIME in the last 168 hours. Cardiac Enzymes: No results for input(s): CKTOTAL, CKMB, CKMBINDEX, TROPONINI in the last 168 hours. BNP (last 3 results) No results for input(s): PROBNP in the last 8760 hours. HbA1C: No results for input(s): HGBA1C in the last 72 hours. CBG: Recent Labs  Lab 06/02/21 2019  GLUCAP 128*   Lipid Profile: No results for input(s): CHOL, HDL, LDLCALC, TRIG, CHOLHDL, LDLDIRECT in the last 72 hours. Thyroid Function Tests: No results for input(s): TSH, T4TOTAL, FREET4, T3FREE, THYROIDAB in the last 72 hours.  Anemia Panel: Recent Labs    06/01/21 1502  VITAMINB12 527  FOLATE 10.3    Sepsis Labs: Recent Labs  Lab 05/22/2021 2214 06/01/21 0358 06/02/21 0352  PROCALCITON 0.16 0.24 0.24     Recent Results (from the past 240 hour(s))  Resp Panel by RT-PCR (Flu A&B, Covid) Nasopharyngeal Swab      Status: Abnormal   Collection Time: 06/06/2021  1:47 PM   Specimen: Nasopharyngeal Swab; Nasopharyngeal(NP) swabs in vial transport medium  Result Value Ref Range Status   SARS Coronavirus 2 by RT PCR POSITIVE (A) NEGATIVE Final    Comment: (NOTE) SARS-CoV-2 target nucleic acids are DETECTED.  The SARS-CoV-2 RNA is generally detectable in upper respiratory specimens during the acute phase of infection. Positive results are indicative of the presence of the identified virus, but do not rule out bacterial infection or co-infection with other pathogens not detected by the test. Clinical correlation with patient history and other diagnostic information is necessary to determine patient infection status. The expected result is Negative.  Fact Sheet for Patients: EntrepreneurPulse.com.au  Fact Sheet for Healthcare Providers: IncredibleEmployment.be  This test is not yet approved or cleared by the [place] FDA and  has been authorized for detection and/or diagnosis of SARS-CoV-2 by FDA under an Emergency Use Authorization (EUA).  This EUA will remain in effect (meaning this test can be used) for the duration of  the COVID-19 declaration under Section 564(b)(1) of the A ct, 21 U.S.C. section 360bbb-3(b)(1), unless the authorization is terminated or revoked sooner.     Influenza A by PCR NEGATIVE NEGATIVE Final   Influenza B by PCR NEGATIVE NEGATIVE Final    Comment: (NOTE) The Xpert Xpress SARS-CoV-2/FLU/RSV plus assay is intended as an aid in the diagnosis of influenza from Nasopharyngeal swab specimens and should not be used as a sole basis for treatment. Nasal washings and aspirates are unacceptable for Xpert Xpress SARS-CoV-2/FLU/RSV testing.  Fact Sheet for Patients: EntrepreneurPulse.com.au  Fact Sheet for Healthcare Providers: IncredibleEmployment.be  This test is not yet approved or cleared by the  [place] FDA and has been authorized for detection and/or diagnosis of SARS-CoV-2 by FDA under an Emergency Use Authorization (EUA). This EUA will remain in effect (meaning this test can be used) for the duration of the COVID-19 declaration under Section 564(b)(1) of the Act, 21 U.S.C. section 360bbb-3(b)(1), unless the authorization is terminated or revoked.  Performed at [facility], [address] [place], [place] [address]   MRSA Next Gen by PCR, Nasal     Status: None   Collection Time: 05/22/2021  8:09 PM   Specimen: Nasal Mucosa; Nasal Swab  Result Value  Ref Range Status   MRSA by PCR Next Gen NOT DETECTED NOT DETECTED Final    Comment: (NOTE) The GeneXpert MRSA Assay (FDA approved for NASAL specimens only), is one component of a comprehensive MRSA colonization surveillance program. It is not intended to diagnose MRSA infection nor to guide or monitor treatment for MRSA infections. Test performance is not FDA approved in patients less than 25 years old. Performed at [facility], [place] [address] [place] [address], [place], [place] [address]   Culture, blood (routine x 2)     Status: None (Preliminary result)   Collection Time: 06/01/21  3:02 PM   Specimen: BLOOD RIGHT WRIST  Result Value Ref Range Status   Specimen Description   Final    BLOOD RIGHT WRIST Performed at [facility] [address] [place], [place] [address]    Special Requests   Final    BOTTLES DRAWN AEROBIC AND ANAEROBIC Blood Culture adequate volume Performed at [facility] [address] [place], [place] [address]    Culture   Final    NO GROWTH 2 DAYS Performed at [facility] [address], [place], [place] [address]    Report Status PENDING  Incomplete  Culture, blood (routine x 2)     Status: None (Preliminary result)   Collection Time: 06/01/21  3:02 PM   Specimen: BLOOD  Result Value Ref Range Status   Specimen  Description   Final    BLOOD BLOOD RIGHT HAND Performed at [facility] [address], [place], [place] [address]    Special Requests   Final    BOTTLES DRAWN AEROBIC AND ANAEROBIC Blood Culture results may not be optimal due to an inadequate volume of blood received in culture bottles Performed at [facility] [address] [place], [place] [address]    Culture   Final    NO GROWTH 2 DAYS Performed at [facility] [address] [place], [place] [address]    Report Status PENDING  Incomplete      Radiology Studies: CT HEAD WO CONTRAST (5MM)  Result Date: 06/01/2021 CLINICAL DATA:  Mental status change, unknown cause EXAM: CT HEAD WITHOUT CONTRAST TECHNIQUE: Contiguous axial images were obtained from the base of the skull through the vertex without intravenous contrast. RADIATION DOSE REDUCTION: This exam was performed according to the departmental dose-optimization program which includes automated exposure control, adjustment of the mA and/or kV according to patient size and/or use of iterative reconstruction technique. COMPARISON:  05/13/2021 FINDINGS: Brain: There is atrophy and chronic small vessel disease changes. No acute intracranial abnormality. Specifically, no hemorrhage, hydrocephalus, mass lesion, acute infarction, or significant intracranial injury. Vascular: No hyperdense vessel or unexpected calcification. Skull: No acute calvarial abnormality. Sinuses/Orbits: No acute findings Other: None IMPRESSION: Atrophy, chronic microvascular disease. No acute intracranial abnormality. Electronically Signed   By: Rolm Baptise M.D.   On: 06/01/2021 18:01   CT CERVICAL SPINE WO CONTRAST  Result Date: 06/01/2021 CLINICAL DATA:  Neck pain, acute, no red flags EXAM: CT CERVICAL SPINE WITHOUT CONTRAST TECHNIQUE: Multidetector CT imaging of the cervical spine was performed without intravenous contrast. Multiplanar CT image reconstructions were  also generated. RADIATION DOSE REDUCTION: This exam was performed according to the departmental dose-optimization program which includes automated exposure control, adjustment of the mA and/or kV according to patient size and/or use of iterative reconstruction technique. COMPARISON:  None. FINDINGS: Alignment: Normal Skull base and vertebrae: No acute fracture.  No primary bone lesion or focal pathologic process. Soft tissues and spinal canal: No prevertebral fluid or swelling. No visible canal hematoma. Disc levels: Diffuse degenerative disc disease, most pronounced at C5-6 and C6-7. Moderate diffuse degenerative facet disease, most pronounced in the lower cervical spine. Bilateral neural foraminal narrowing at C5-6. Upper chest: No acute findings Other: None IMPRESSION: Degenerative disc and facet disease. No acute bony abnormality. Electronically Signed   By: Rolm Baptise M.D.   On: 06/01/2021 18:03    Scheduled Meds:  allopurinol  100 mg Oral Daily   aspirin EC  81 mg Oral Daily   dexamethasone (DECADRON) injection  6 mg Intravenous Q24H   enoxaparin (LOVENOX) injection  30 mg Subcutaneous Q24H   loratadine  10 mg Oral Daily   mouth rinse  15 mL Mouth Rinse BID   nebivolol  2.5 mg Oral Daily   Continuous Infusions:  sodium chloride 10 mL/hr (06/02/21 1037)   azithromycin 500 mg (06/02/21 2314)   cefTRIAXone (ROCEPHIN)  IV 1 g (06/02/21 2226)   dextrose 100 mL/hr at 06/03/21 1010     LOS: 4 days   Marylu Lund, MD [facility] Pager On Amion  If 7PM-7AM, please contact night-coverage 06/03/2021, 4:53 PM

## 2021-06-03 NOTE — Progress Notes (Signed)
Patient with repeated infiltrate to right arm, arm very edematous.  Patient had a left lymph node resection in 2002, spoke with IV team regarding this.  As it has been over 20 years since patient had lymph node resection,  patient has no lymphedema in left arm and right arm is severely edematous MD gave ok to use left arm for IV.  #22 gauge placed  LPFA without difficulty. Will continue to monitor.

## 2021-06-04 ENCOUNTER — Inpatient Hospital Stay (HOSPITAL_COMMUNITY): Payer: Medicare Other

## 2021-06-04 DIAGNOSIS — J810 Acute pulmonary edema: Secondary | ICD-10-CM | POA: Diagnosis not present

## 2021-06-04 DIAGNOSIS — R0603 Acute respiratory distress: Secondary | ICD-10-CM | POA: Diagnosis not present

## 2021-06-04 DIAGNOSIS — Z515 Encounter for palliative care: Secondary | ICD-10-CM

## 2021-06-04 DIAGNOSIS — Z7189 Other specified counseling: Secondary | ICD-10-CM

## 2021-06-04 DIAGNOSIS — U071 COVID-19: Secondary | ICD-10-CM | POA: Diagnosis not present

## 2021-06-04 DIAGNOSIS — R410 Disorientation, unspecified: Secondary | ICD-10-CM | POA: Diagnosis not present

## 2021-06-04 DIAGNOSIS — J9601 Acute respiratory failure with hypoxia: Secondary | ICD-10-CM | POA: Diagnosis not present

## 2021-06-04 LAB — CBC WITH DIFFERENTIAL/PLATELET
Abs Immature Granulocytes: 0.2 10*3/uL — ABNORMAL HIGH (ref 0.00–0.07)
Basophils Absolute: 0 10*3/uL (ref 0.0–0.1)
Basophils Relative: 0 %
Eosinophils Absolute: 0 10*3/uL (ref 0.0–0.5)
Eosinophils Relative: 0 %
HCT: 37.5 % (ref 36.0–46.0)
Hemoglobin: 11.5 g/dL — ABNORMAL LOW (ref 12.0–15.0)
Immature Granulocytes: 2 %
Lymphocytes Relative: 7 %
Lymphs Abs: 0.8 10*3/uL (ref 0.7–4.0)
MCH: 30 pg (ref 26.0–34.0)
MCHC: 30.7 g/dL (ref 30.0–36.0)
MCV: 97.9 fL (ref 80.0–100.0)
Monocytes Absolute: 0.8 10*3/uL (ref 0.1–1.0)
Monocytes Relative: 7 %
Neutro Abs: 9.6 10*3/uL — ABNORMAL HIGH (ref 1.7–7.7)
Neutrophils Relative %: 84 %
Platelets: 174 10*3/uL (ref 150–400)
RBC: 3.83 MIL/uL — ABNORMAL LOW (ref 3.87–5.11)
RDW: 15.4 % (ref 11.5–15.5)
WBC: 11.5 10*3/uL — ABNORMAL HIGH (ref 4.0–10.5)
nRBC: 0 % (ref 0.0–0.2)

## 2021-06-04 LAB — COMPREHENSIVE METABOLIC PANEL
ALT: 32 U/L (ref 0–44)
AST: 31 U/L (ref 15–41)
Albumin: 3.6 g/dL (ref 3.5–5.0)
Alkaline Phosphatase: 72 U/L (ref 38–126)
Anion gap: 13 (ref 5–15)
BUN: 119 mg/dL — ABNORMAL HIGH (ref 8–23)
CO2: 22 mmol/L (ref 22–32)
Calcium: 9.4 mg/dL (ref 8.9–10.3)
Chloride: 115 mmol/L — ABNORMAL HIGH (ref 98–111)
Creatinine, Ser: 2.37 mg/dL — ABNORMAL HIGH (ref 0.44–1.00)
GFR, Estimated: 19 mL/min — ABNORMAL LOW (ref >60)
Glucose, Bld: 170 mg/dL — ABNORMAL HIGH (ref 70–99)
Potassium: 3.6 mmol/L (ref 3.5–5.1)
Sodium: 150 mmol/L — ABNORMAL HIGH (ref 135–145)
Total Bilirubin: 0.6 mg/dL (ref 0.3–1.2)
Total Protein: 6.6 g/dL (ref 6.5–8.1)

## 2021-06-04 LAB — VITAMIN B1: Vitamin B1 (Thiamine): 107.7 nmol/L (ref 66.5–200.0)

## 2021-06-04 LAB — D-DIMER, QUANTITATIVE: D-Dimer, Quant: 0.98 ug/mL-FEU — ABNORMAL HIGH (ref 0.00–0.50)

## 2021-06-04 LAB — C-REACTIVE PROTEIN: CRP: 1 mg/dL — ABNORMAL HIGH (ref <1.0)

## 2021-06-04 MED ORDER — LORAZEPAM 2 MG/ML IJ SOLN: 1.0000 mg | INTRAMUSCULAR | Status: DC | PRN

## 2021-06-04 MED ORDER — GLYCOPYRROLATE 0.2 MG/ML IJ SOLN
0.2000 mg | INTRAMUSCULAR | Status: DC | PRN
  Administered 2021-06-04: 0.2 mg via INTRAVENOUS
  Filled 2021-06-04: qty 1

## 2021-06-04 MED ORDER — HALOPERIDOL LACTATE 5 MG/ML IJ SOLN: 0.5000 mg | INTRAMUSCULAR | Status: DC | PRN

## 2021-06-04 MED ORDER — HYDROMORPHONE HCL 1 MG/ML IJ SOLN: 0.5000 mg | INTRAMUSCULAR | Status: AC

## 2021-06-04 MED ORDER — HALOPERIDOL LACTATE 2 MG/ML PO CONC: 0.5000 mg | ORAL | Status: DC | PRN

## 2021-06-04 MED ORDER — HYDROMORPHONE BOLUS VIA INFUSION
0.5000 mg | INTRAVENOUS | Status: DC | PRN
  Filled 2021-06-04: qty 1

## 2021-06-04 MED ORDER — ONDANSETRON HCL 4 MG/2ML IJ SOLN: 4.0000 mg | Freq: Four times a day (QID) | INTRAMUSCULAR | Status: DC | PRN

## 2021-06-04 MED ORDER — HALOPERIDOL 0.5 MG PO TABS: 0.5000 mg | ORAL_TABLET | ORAL | Status: DC | PRN

## 2021-06-04 MED ORDER — POLYVINYL ALCOHOL 1.4 % OP SOLN: 1.0000 [drp] | Freq: Four times a day (QID) | OPHTHALMIC | Status: DC | PRN

## 2021-06-04 MED ORDER — GLYCOPYRROLATE 0.2 MG/ML IJ SOLN: 0.2000 mg | INTRAMUSCULAR | Status: DC | PRN

## 2021-06-04 MED ORDER — LORAZEPAM 2 MG/ML PO CONC: 1.0000 mg | ORAL | Status: DC | PRN

## 2021-06-04 MED ORDER — HYDROMORPHONE HCL 1 MG/ML IJ SOLN
INTRAMUSCULAR | Status: AC
  Administered 2021-06-04: 0.5 mg
  Filled 2021-06-04: qty 0.5

## 2021-06-04 MED ORDER — SODIUM CHLORIDE 0.9 % IV SOLN
0.3000 mg/h | INTRAVENOUS | Status: DC
  Filled 2021-06-04: qty 5

## 2021-06-04 MED ORDER — BIOTENE DRY MOUTH MT LIQD: 15.0000 mL | OROMUCOSAL | Status: DC | PRN

## 2021-06-04 MED ORDER — GLYCOPYRROLATE 1 MG PO TABS: 1.0000 mg | ORAL_TABLET | ORAL | Status: DC | PRN

## 2021-06-04 MED ORDER — ONDANSETRON 4 MG PO TBDP: 4.0000 mg | ORAL_TABLET | Freq: Four times a day (QID) | ORAL | Status: DC | PRN

## 2021-06-04 MED ORDER — LORAZEPAM 1 MG PO TABS: 1.0000 mg | ORAL_TABLET | ORAL | Status: DC | PRN

## 2021-06-06 LAB — CULTURE, BLOOD (ROUTINE X 2)
Culture: NO GROWTH
Culture: NO GROWTH
Special Requests: ADEQUATE

## 2021-06-08 ENCOUNTER — Encounter: Payer: Self-pay | Admitting: Internal Medicine

## 2021-06-09 NOTE — Discharge Summary (Signed)
Death Summary  Rhonda Sharp PXT:[id number] DOB: 10-09-1928 DOA: June 16, 2021  PCP: [name], DO  Admit date: Jun 16, 2021 Date of Death: 21-Jun-2021 Time of Death: 08/08/1416 Notification: [name], DO notified of death of 06/21/2021   History of present illness:  Pt was a 88 y.o. female with a history of chronic diastolic heart failure, DVT not on anticoagulation, breast cancer in remission, hypertension, CKD. Patient presented secondary to shortness of breath with evidence of acute respiratory from heart failure and COVID-19 pneumonia. Patient was started on IV Lasix with improvement some improvement initially. Remdesivir IV and Decadron was started for COVID-19 pneumonia with hypoxia. Hospitalization was complicated by delirium and later overall medical decline  Final Diagnoses:  Principal Problem:   Acute respiratory failure with hypoxia ([place]) Active Problems:   Essential hypertension   CKD (chronic kidney disease), stage IV (HCC)   Pneumonia due to COVID-19 virus   Acute on chronic diastolic CHF (congestive heart failure) (HCC)   Acute respiratory failure with hypoxemia (HCC)   Delirium   * Acute respiratory failure with hypoxia ([place])- (present on admission) Likely multifactorial in setting of COVID-19 pneumonia and pulmonary edema. Patient initially required BiPAP with oxygen requirement significantly improved after Lasix IV. -Later noted to be clinically dehydrated, thus lasix discontinued -Pt had been weaned to room air,however clinically has not made much improvement   Delirium In setting of underlying dementia. TSH normal. -Delirium precautions -CT head/neck reviewed, neg -Vitamin B1/B12, Folate, ammonia unremarkable -Blood cultures neg thus far -Did not pass SLP. Recs for NPO status. Given continued decline despite aggressive measures, appreciate input by palliative care. Given continued overall decline, decision was made for transition to comfort measures   Acute  on chronic diastolic CHF (congestive heart failure) ([place])- (present on admission) Pulmonary edema suggested on chest x-ray. Given Lasix IV with initial improvement of hypoxia.  -Now clinically dehydrated with ARF, hypernatremia, dry mucus membranes, thus have discontinued further lasix -Later focus was on comfort measures   Pneumonia due to COVID-19 virus Associated multifocal infiltrates concerning for possible infection vs edema. Associated hypoxia. Patient started on Remdesivir, Ceftriaxone, Azithromycin and Decadron IV. Procalcitonin of 0.16 > 0.24. -Was continued on Remdesivir IV and Decadron IV -Was also given  Ceftriaxone and azithromycin -Later transitioned to comfort measures only   Acute on CKD (chronic kidney disease), stage IV ([place])- (present on admission) Baseline creatinine of about 2.  Despite continued IVF, pt noted to have progressively worsening renal function and decreasing urine output Later transitioned to comfort measures only   Essential hypertension- (present on admission) -hydralazine, amlodipine and nebivolol was on hold secondary to soft BP -transitioned to comfort measures   Dehydration -Given trial of IVF   Hypernatremia -Suspect secondary to dehydration -Minimal improvement with IVF   End of Life -Appreciate assistance by Palliative Care.  -Pt with progressive clinical decline despite IVF and abx -Discussed with Palliative Care, after family meeting, decision was made to transition to comfort. - on 06-21-21 at 08-Aug-1416, patient was pronounced   Leukocytosis   The results of significant diagnostics from this hospitalization (including imaging, microbiology, ancillary and laboratory) are listed below for reference.    Significant Diagnostic Studies: CT HEAD WO CONTRAST (5MM)  Result Date: 06/01/2021 CLINICAL DATA:  Mental status change, unknown cause EXAM: CT HEAD WITHOUT CONTRAST TECHNIQUE: Contiguous axial images were obtained from the base of the  skull through the vertex without intravenous contrast. RADIATION DOSE REDUCTION: This exam was performed according to the departmental dose-optimization program which includes  automated exposure control, adjustment of the mA and/or kV according to patient size and/or use of iterative reconstruction technique. COMPARISON:  05/13/2021 FINDINGS: Brain: There is atrophy and chronic small vessel disease changes. No acute intracranial abnormality. Specifically, no hemorrhage, hydrocephalus, mass lesion, acute infarction, or significant intracranial injury. Vascular: No hyperdense vessel or unexpected calcification. Skull: No acute calvarial abnormality. Sinuses/Orbits: No acute findings Other: None IMPRESSION: Atrophy, chronic microvascular disease. No acute intracranial abnormality. Electronically Signed   By: Rolm Baptise M.D.   On: 06/01/2021 18:01   CT HEAD WO CONTRAST  Result Date: 05/13/2021 CLINICAL DATA:  Concern for stroke, change in mental status, slurred speech EXAM: CT HEAD WITHOUT CONTRAST TECHNIQUE: Contiguous axial images were obtained from the base of the skull through the vertex without intravenous contrast. COMPARISON:  05/22/2018 FINDINGS: Brain: Stable atrophy and chronic white matter microvascular ischemic changes throughout both cerebral hemispheres. No acute intracranial hemorrhage, mass lesion, acute infarction, midline shift, herniation, hydrocephalus or extra-axial fluid collection. No focal mass effect or edema. Cisterns are patent. Cerebellar atrophy as well. Vascular: No hyperdense vessel or unexpected calcification. Skull: Normal. Negative for fracture or focal lesion. Sinuses/Orbits: No acute finding. Other: None. IMPRESSION: Stable atrophy pattern and chronic white matter microvascular ischemic changes. No acute intracranial abnormality by noncontrast CT or significant interval change. Electronically Signed   By: Jerilynn Mages.  Shick M.D.   On: 05/13/2021 15:58   CT CERVICAL SPINE WO  CONTRAST  Result Date: 06/01/2021 CLINICAL DATA:  Neck pain, acute, no red flags EXAM: CT CERVICAL SPINE WITHOUT CONTRAST TECHNIQUE: Multidetector CT imaging of the cervical spine was performed without intravenous contrast. Multiplanar CT image reconstructions were also generated. RADIATION DOSE REDUCTION: This exam was performed according to the departmental dose-optimization program which includes automated exposure control, adjustment of the mA and/or kV according to patient size and/or use of iterative reconstruction technique. COMPARISON:  None. FINDINGS: Alignment: Normal Skull base and vertebrae: No acute fracture. No primary bone lesion or focal pathologic process. Soft tissues and spinal canal: No prevertebral fluid or swelling. No visible canal hematoma. Disc levels: Diffuse degenerative disc disease, most pronounced at C5-6 and C6-7. Moderate diffuse degenerative facet disease, most pronounced in the lower cervical spine. Bilateral neural foraminal narrowing at C5-6. Upper chest: No acute findings Other: None IMPRESSION: Degenerative disc and facet disease. No acute bony abnormality. Electronically Signed   By: Rolm Baptise M.D.   On: 06/01/2021 18:03   MR Brain Wo Contrast (neuro protocol)  Result Date: 05/13/2021 CLINICAL DATA:  Initial evaluation for acute TIA. EXAM: MRI HEAD WITHOUT CONTRAST TECHNIQUE: Multiplanar, multiecho pulse sequences of the brain and surrounding structures were obtained without intravenous contrast. COMPARISON:  Prior CT from earlier the same day. FINDINGS: Brain: Age-related cerebral atrophy with moderate chronic microvascular ischemic disease. Few scattered remote lacunar infarcts present about the thalami, basal ganglia, and pons. No abnormal foci of restricted diffusion to suggest acute or subacute ischemia. Gray-white matter differentiation maintained. No encephalomalacia to suggest chronic cortical infarction. No acute or chronic intracranial hemorrhage. No mass  lesion, midline shift or mass effect no hydrocephalus or extra-axial fluid collection. Pituitary gland suprasellar region normal. Midline structures intact. Vascular: Major intracranial vascular flow voids are maintained. Skull and upper cervical spine: Craniocervical junction within normal limits. Bone marrow signal intensity normal. No scalp soft tissue abnormality. Sinuses/Orbits: Prior ocular lens replacement on the right. Globes and orbital soft tissues demonstrate no acute finding. Paranasal sinuses are largely clear. No significant mastoid effusion. Other: None. IMPRESSION: 1.  No acute intracranial abnormality. 2. Age-related cerebral atrophy with moderate chronic microvascular ischemic disease. Electronically Signed   By: Jeannine Boga M.D.   On: 05/13/2021 22:19   US RENAL  Result Date: Jun 07, 2021 CLINICAL DATA:  Acute renal failure EXAM: RENAL / URINARY TRACT ULTRASOUND COMPLETE COMPARISON:  None. FINDINGS: The patient was unable to cooperate with today's exam, causing limitations in exam sensitivity and specificity. Right Kidney: Renal measurements: 10.6 by 4.8 by 4.5 cm = volume: 118 mL. Numerous bilateral renal cysts. The largest measured 4.3 by 3.9 by 3.7 cm. Some have septations or potentially complex elements. Renal echogenicity appears slightly greater than that in the liver, for example on image 16 of series 1 -1, raising the possibility of chronic medical renal disease. No hydronephrosis. Left Kidney: Renal measurements: 8.1 by 4.5 by 4.3 cm = volume: 81 mL. Numerous bilateral renal cysts. Suspected mildly echogenic left kidney. No hydronephrosis noted. Bladder: Appears normal for degree of bladder distention. Other: None. IMPRESSION: 1. Reduced exam sensitivity and specificity due to patient inability to cooperate with today's exam. 2. Numerous bilateral renal cysts. Possibly mild complex elements in some of these including septations, but no solid mass was identified. The cysts could  be further assessed with renal protocol MRI with and without contrast following the patient's acute episode, clinically warranted. 3. Mildly echogenic kidneys suggesting chronic medical renal disease. No hydronephrosis observed. Electronically Signed   By: Van Clines M.D.   On: 06-07-21 09:33   DG Chest Portable 1 View  Result Date: 06/03/2021 CLINICAL DATA:  COVID, hypoxia cough, shortness of breath EXAM: PORTABLE CHEST 1 VIEW COMPARISON:  11/12/2018 FINDINGS: Cardiomegaly. Mild, diffuse bilateral interstitial pulmonary opacity. The visualized skeletal structures are unremarkable. IMPRESSION: Cardiomegaly with mild, diffuse bilateral interstitial pulmonary opacity, consistent with edema or infection. No focal airspace opacity. Electronically Signed   By: Delanna Ahmadi M.D.   On: 05/29/2021 15:48   VAS [place] LOWER EXTREMITY VENOUS (DVT)  Result Date: 06/01/2021  Lower Venous DVT Study Patient Name:  Rhonda Sharp  Date of Exam:   05/31/2021 Medical Rec #: [id number]           Accession #:    [id number] Date of Birth: 09-Jun-1928           Patient Gender: F Patient Age:   33 years Exam Location:  [facility] Procedure:      VAS [place] LOWER EXTREMITY VENOUS (DVT) Referring Phys: Gean Birchwood --------------------------------------------------------------------------------  Indications: Anemia with history of DVT. Other Indications: COVID+ with elevated d-dimer (2.23). Limitations: Combative and uncooperative. Comparison Study: Previous exam on 06/05/2018 was positive for chronic DVT in LLE                   Pop[facility]. Performing Technologist: Rogelia Rohrer RVT, RDMS  Examination Guidelines: A complete evaluation includes B-mode imaging, spectral Doppler, color Doppler, and power Doppler as needed of all accessible portions of each vessel. Bilateral testing is considered an integral part of a complete examination. Limited examinations for reoccurring indications may be performed as noted. The reflux  portion of the exam is performed with the patient in reverse Trendelenburg.  +---------+---------------+---------+-----------+----------+-------------------+  RIGHT     Compressibility Phasicity Spontaneity Properties Thrombus Aging       +---------+---------------+---------+-----------+----------+-------------------+  CFV       Full            Yes       Yes                                         +---------+---------------+---------+-----------+----------+-------------------+  SFJ       Full                                                                  +---------+---------------+---------+-----------+----------+-------------------+  FV Prox   Full            Yes       Yes                                         +---------+---------------+---------+-----------+----------+-------------------+  FV Mid    Full            Yes       Yes                                         +---------+---------------+---------+-----------+----------+-------------------+  FV Distal                                                  Not visualized       +---------+---------------+---------+-----------+----------+-------------------+  PFV       Full                                                                  +---------+---------------+---------+-----------+----------+-------------------+  POP       Full            Yes       Yes                                         +---------+---------------+---------+-----------+----------+-------------------+  PTV       Full                                             Not well visualized  +---------+---------------+---------+-----------+----------+-------------------+  PERO      Full                                             Not well visualized  +---------+---------------+---------+-----------+----------+-------------------+   Left Technical Findings: Left leg not evaluated. LLE unable to be examined due to patient being combative and refusing to position legs so test could be completed.    Summary: RIGHT: - There is no evidence of deep vein thrombosis in the lower extremity. - There is no evidence of superficial venous thrombosis.  - No cystic structure found in the popliteal fossa.   *See table(s) above for measurements  and observations. Electronically signed by Orlie Pollen on 06/01/2021 at 6:44:00 AM.    Final     Microbiology: Recent Results (from the past 240 hour(s))  Resp Panel by RT-PCR (Flu A&B, Covid) Nasopharyngeal Swab     Status: Abnormal   Collection Time: 05/10/2021  1:47 PM   Specimen: Nasopharyngeal Swab; Nasopharyngeal(NP) swabs in vial transport medium  Result Value Ref Range Status   SARS Coronavirus 2 by RT PCR POSITIVE (A) NEGATIVE Final    Comment: (NOTE) SARS-CoV-2 target nucleic acids are DETECTED.  The SARS-CoV-2 RNA is generally detectable in upper respiratory specimens during the acute phase of infection. Positive results are indicative of the presence of the identified virus, but do not rule out bacterial infection or co-infection with other pathogens not detected by the test. Clinical correlation with patient history and other diagnostic information is necessary to determine patient infection status. The expected result is Negative.  Fact Sheet for Patients: EntrepreneurPulse.com.au  Fact Sheet for Healthcare Providers: IncredibleEmployment.be  This test is not yet approved or cleared by the [place] FDA and  has been authorized for detection and/or diagnosis of SARS-CoV-2 by FDA under an Emergency Use Authorization (EUA).  This EUA will remain in effect (meaning this test can be used) for the duration of  the COVID-19 declaration under Section 564(b)(1) of the A ct, 21 U.S.C. section 360bbb-3(b)(1), unless the authorization is terminated or revoked sooner.     Influenza A by PCR NEGATIVE NEGATIVE Final   Influenza B by PCR NEGATIVE NEGATIVE Final    Comment: (NOTE) The Xpert Xpress  SARS-CoV-2/FLU/RSV plus assay is intended as an aid in the diagnosis of influenza from Nasopharyngeal swab specimens and should not be used as a sole basis for treatment. Nasal washings and aspirates are unacceptable for Xpert Xpress SARS-CoV-2/FLU/RSV testing.  Fact Sheet for Patients: EntrepreneurPulse.com.au  Fact Sheet for Healthcare Providers: IncredibleEmployment.be  This test is not yet approved or cleared by the [place] FDA and has been authorized for detection and/or diagnosis of SARS-CoV-2 by FDA under an Emergency Use Authorization (EUA). This EUA will remain in effect (meaning this test can be used) for the duration of the COVID-19 declaration under Section 564(b)(1) of the Act, 21 U.S.C. section 360bbb-3(b)(1), unless the authorization is terminated or revoked.  Performed at [facility], [address] [place], [place] [address]   MRSA Next Gen by PCR, Nasal     Status: None   Collection Time: 06/06/2021  8:09 PM   Specimen: Nasal Mucosa; Nasal Swab  Result Value Ref Range Status   MRSA by PCR Next Gen NOT DETECTED NOT DETECTED Final    Comment: (NOTE) The GeneXpert MRSA Assay (FDA approved for NASAL specimens only), is one component of a comprehensive MRSA colonization surveillance program. It is not intended to diagnose MRSA infection nor to guide or monitor treatment for MRSA infections. Test performance is not FDA approved in patients less than 86 years old. Performed at [facility], [place] [address], [place], [place] [address]   Culture, blood (routine x 2)     Status: None (Preliminary result)   Collection Time: 06/01/21  3:02 PM   Specimen: BLOOD RIGHT WRIST  Result Value Ref Range Status   Specimen Description   Final    BLOOD RIGHT WRIST Performed at [facility] [address], [place], [place] [address]    Special Requests   Final    BOTTLES DRAWN  AEROBIC AND ANAEROBIC Blood  Culture adequate volume Performed at [facility] [address] [place], [place] [address]    Culture   Final    NO GROWTH 3 DAYS Performed at [facility], [place] [address], [place], [place] [address]    Report Status PENDING  Incomplete  Culture, blood (routine x 2)     Status: None (Preliminary result)   Collection Time: 06/01/21  3:02 PM   Specimen: BLOOD  Result Value Ref Range Status   Specimen Description   Final    BLOOD BLOOD RIGHT HAND Performed at [facility] [address], [place], [place] [address]    Special Requests   Final    BOTTLES DRAWN AEROBIC AND ANAEROBIC Blood Culture results may not be optimal due to an inadequate volume of blood received in culture bottles Performed at [facility] [address], [place], [place] [address]    Culture   Final    NO GROWTH 3 DAYS Performed at [facility], [place] [address], [place], [place] [address]    Report Status PENDING  Incomplete     Labs: Basic Metabolic Panel: Recent Labs  Lab 05/31/21 0531 06/01/21 0358 06/02/21 0352 06/03/21 0346 06/03/21 1328 June 15, 2021 0352  NA 141 143 150* 157* 155* 150*  K 4.2 3.6 3.4* 3.3* 3.9 3.6  CL 106 107 109 118* 119* 115*  CO2 21* 23 25 25 23 22   GLUCOSE 140* 123* 140* 180* 175* 170*  BUN 51* 67* 91* 106* 112* 119*  CREATININE 1.90* 1.89* 2.12* 2.19* 2.28* 2.37*  CALCIUM 9.7 9.5 9.7 9.6 9.7 9.4  MG 2.1  --   --   --   --   --    Liver Function Tests: Recent Labs  Lab 05/31/21 0531 06/01/21 0358 06/02/21 0352 06/03/21 0346 06/15/21 0352  AST 73* 67* 64* 46* 31  ALT 56* 47* 48* 39 32  ALKPHOS 118 91 91 85 72  BILITOT 0.9 0.6 0.7 0.6 0.6  PROT 7.9 7.2 7.9 7.6 6.6  ALBUMIN 4.3 3.8 4.3 4.0 3.6   No results for input(s): LIPASE, AMYLASE in the last 168 hours. Recent Labs  Lab 06/01/21 1502  AMMONIA 17   CBC: Recent Labs  Lab 05/31/21 0531 06/01/21 0358  06/02/21 0352 06/03/21 0346 06/15/21 0352  WBC 7.4 7.2 12.2* 10.9* 11.5*  NEUTROABS 5.9 5.3 9.9* 8.7* 9.6*  HGB 10.8* 11.1* 12.3 12.2 11.5*  HCT 34.5* 33.8* 38.4 38.5 37.5  MCV 95.8 94.9 95.5 96.7 97.9  PLT 152 144* 193 182 174   Cardiac Enzymes: No results for input(s): CKTOTAL, CKMB, CKMBINDEX, TROPONINI in the last 168 hours. D-Dimer Recent Labs    06/03/21 0346 06-15-21 0352  DDIMER 1.06* 0.98*   BNP: Invalid input(s): POCBNP CBG: Recent Labs  Lab 06/02/21 2019  GLUCAP 128*   Anemia work up Recent Labs    06/01/21 1502  VITAMINB12 527  FOLATE 10.3   Urinalysis    Component Value Date/Time   COLORURINE YELLOW 11/12/2018 [place] 11/12/2018 1118   LABSPEC 1.008 11/12/2018 1118   PHURINE 5.0 11/12/2018 [place] 11/12/2018 1118   [place] 11/12/2018 1118   [place] 11/12/2018 1118   [place] 11/12/2018 1118   PROTEINUR 30 (A) 11/12/2018 1118   NITRITE NEGATIVE 11/12/2018 1118   LEUKOCYTESUR LARGE (A) 11/12/2018 1118   Sepsis Labs Invalid input(s): PROCALCITONIN,  WBC,  LACTICIDVEN    SIGNED:  Annie Main  Wyline Copas, MD  [facility] 2021/06/09, 2:27 PM  If 7PM-7AM, please contact night-coverage [email or web address] Password [email or web address]

## 2021-06-09 NOTE — Consult Note (Signed)
Palliative care consult note  Reason for consult: Goals of care in light of COVID-19 infection with subsequent hypernatremia and AKI  Palliative care consult received.  Chart reviewed including personal review of pertinent labs and imaging.  Discussed case with Dr. Wyline Copas and bedside care team.  Briefly, Rhonda Sharp is a 88 year old female with past medical history of chronic diastolic heart failure, DVT not on anticoagulation, breast cancer in remission, hypertension, CKD who presented with mix of acute respiratory failure from combination of heart failure and COVID-19 pneumonia.  She was initially started on IV Lasix, remdesivir and Decadron.  She had worsening delirium and overall decline with noted AKI and hypernatremia despite best medical interventions.  I met today with her daughter Rhonda Sharp.   Rhonda Sharp describes to me the continued decline in her nutrition, cognition, and functional status she has been having over the past several months to years that acutely worsened with this event.  She reports that her mother's quality of life has been severely compromised.  We discussed clinical course as well as wishes moving forward in regard to care plan this hospitalization discussed.  We discussed difference between a aggressive medical intervention path and a palliative, comfort focused care path.    I shared with Rhonda Sharp that, based upon my examination today, I believe we are in the last hours to days of life.  She expressed understanding and agreeing with this assessment.  We discussed plan to transition to full comfort care and focus on comfort and dignity as her mother approaches end-of-life.  Questions and concerns addressed.   PMT will continue to support holistically.   Recommendations: -Full comfort care -Shortness of breath: Dyspneic at this time.  Plan for one-time dose of 0.5 mg of Dilaudid followed by initiation of Dilaudid continuous infusion at 0.25 mg/h. -Excess secretions: Plan for  dose of Robinul at this time.  Continue Robinul as needed -Anxiety: Ativan as needed -Agitation: Haldol as needed  At this point, I would expect her prognosis is likely hours to a very limited number of days.  She is COVID-positive which would limit options for discharge if she were to stabilize enough to do so.  I do expect, at this point in time, this will be a terminal admission.  I shared this with family as well as case management.  Total time: 80 minutes  Micheline Rough, MD [facility] Team [phone]

## 2021-06-09 NOTE — Progress Notes (Signed)
Patient passed at 1418. The daughter, granddaughter, and myself were at the bedside. Emotional support given to family. Family currently at the bedside spending there last moments with the patient.

## 2021-06-09 NOTE — Progress Notes (Incomplete)
PROGRESS NOTE    Rhonda Sharp  MPN:[id number] DOB: December 15, 1928 DOA: 06/02/2021 PCP: [name], DO    Brief Narrative:  88 y.o. female with a history of chronic diastolic heart failure, DVT not on anticoagulation, breast cancer in remission, hypertension, CKD. Patient presented secondary to shortness of breath with evidence of acute respiratory from heart failure and COVID-19 pneumonia. Patient started on IV Lasix with improvement. Remdesivir IV and Decadron started for treatment of COVID-19 pneumonia with hypoxia. Hospitalization complicated by delirium.  Assessment & Plan:   Principal Problem:   Acute respiratory failure with hypoxia (HCC) Active Problems:   Essential hypertension   CKD (chronic kidney disease), stage IV (HCC)   Pneumonia due to COVID-19 virus   Acute on chronic diastolic CHF (congestive heart failure) (HCC)   Acute respiratory failure with hypoxemia (HCC)   Delirium  * Acute respiratory failure with hypoxia ([place])- (present on admission) Likely multifactorial in setting of COVID-19 pneumonia and pulmonary edema. Patient initially required BiPAP with oxygen requirement significantly improved after Lasix IV. -Later noted to be clinically dehydrated, thus lasix discontinued -Pt had been weaned to room air,however clinically has not made much improvement   Delirium In setting of underlying dementia. TSH normal. -Delirium precautions -CT head/neck reviewed, neg -Vitamin B1/B12, Folate, ammonia unremarkable -Blood cultures neg thus far -Did not pass SLP. Recs for NPO status. Given continued decline despite aggressive measures, appreciate input by palliative care. Given continued overall decline, decision was made for transition to comfort measures   Acute on chronic diastolic CHF (congestive heart failure) ([place])- (present on admission) Pulmonary edema suggested on chest x-ray. Given Lasix IV with initial improvement of hypoxia.  -Now clinically dehydrated  with ARF, hypernatremia, dry mucus membranes, thus have discontinued further lasix -Now focus on comfort measures   Pneumonia due to COVID-19 virus Associated multifocal infiltrates concerning for possible infection vs edema. Associated hypoxia. Patient started on Remdesivir, Ceftriaxone, Azithromycin and Decadron IV. Procalcitonin of 0.16 > 0.24. -Was continued on Remdesivir IV and Decadron IV -Was also given  Ceftriaxone and azithromycin -Now comfort measures only   Acute on CKD (chronic kidney disease), stage IV ([place])- (present on admission) Baseline creatinine of about 2.  Despite continued IVF, pt noted to have progressively worsening renal function and decreasing urine output Now on comfort measures only   Essential hypertension- (present on admission) -hydralazine, amlodipine and nebivolol was on hold secondary to soft BP -Now comfort measures  Dehydration -Given trial of IVF  Hypernatremia -Suspect secondary to dehydration -Minimal improvement with IVF  End of Life -Appreciate assistance by Palliative Care.  -Pt with progressive clinical decline despite IVF and abx -Discussed with Palliative Care, after family meeting, decision was made to transition to comfort. Anticipating hospital death    DVT prophylaxis: Lovenox subq Code Status: DNR Family Communication: Pt in room, family currently not at bedside  Status is: Inpatient  Remains inpatient appropriate because: Severity of illness   Consultants:  Palliative Care  Procedures:    Antimicrobials: Anti-infectives (From admission, onward)    Start     Dose/Rate Route Frequency Ordered Stop   06/01/21 1600  cefTRIAXone (ROCEPHIN) 1 g in sodium chloride 0.9 % 100 mL IVPB        1 g 200 mL/hr over 30 Minutes Intravenous Every 24 hours 06/01/21 1016 06/03/21 2228   05/31/21 1700  azithromycin (ZITHROMAX) 500 mg in sodium chloride 0.9 % 250 mL IVPB        500 mg 250 mL/hr over 60  Minutes Intravenous Every 24  hours 05/31/21 0528 06/03/21 2349   05/31/21 1600  cefTRIAXone (ROCEPHIN) 2 g in sodium chloride 0.9 % 100 mL IVPB  Status:  Discontinued        2 g 200 mL/hr over 30 Minutes Intravenous Every 24 hours 05/31/21 0528 06/01/21 1016   05/31/21 1000  remdesivir 100 mg in sodium chloride 0.9 % 100 mL IVPB        100 mg 200 mL/hr over 30 Minutes Intravenous Daily 05/21/2021 1515 06/03/21 1013   06/08/2021 1615  cefTRIAXone (ROCEPHIN) 1 g in sodium chloride 0.9 % 100 mL IVPB        1 g 200 mL/hr over 30 Minutes Intravenous  Once 05/12/2021 1603 05/16/2021 1720   05/15/2021 1615  azithromycin (ZITHROMAX) 500 mg in sodium chloride 0.9 % 250 mL IVPB        500 mg 250 mL/hr over 60 Minutes Intravenous  Once 06/06/2021 1603 05/14/2021 1852   05/24/2021 1600  remdesivir 100 mg in sodium chloride 0.9 % 100 mL IVPB       See Hyperspace for full Linked Orders Report.   100 mg 200 mL/hr over 30 Minutes Intravenous Once 05/09/2021 1515 06/06/2021 1644   06/08/2021 1515  remdesivir 100 mg in sodium chloride 0.9 % 100 mL IVPB       See Hyperspace for full Linked Orders Report.   100 mg 200 mL/hr over 30 Minutes Intravenous Once 05/31/2021 1515 06/07/2021 1610       Subjective: Cannot obtain secondary to mentation  Objective: Vitals:   06/03/21 0939 06/03/21 1441 06/03/21 2049 2021/06/10 1250  BP: 136/85 (!) 126/106 137/90 (!) 145/88  Pulse: (!) 103 67 64 92  Resp:  (!) 22 19   Temp:  98.4 F (36.9 C) 97.6 F (36.4 C) 99 F (37.2 C)  TempSrc:  Oral Oral Axillary  SpO2: 93% 97% 97% 91%  Weight:      Height:        Intake/Output Summary (Last 24 hours) at 2021-06-10 1347 Last data filed at 2021-06-10 0300 Gross per 24 hour  Intake 1769.78 ml  Output 200 ml  Net 1569.78 ml    Filed Weights   05/31/21 0611 06/01/21 0500 06/02/21 0517  Weight: 65.5 kg 65.5 kg 58.7 kg    Examination: General exam: Not conversant, in no acute distress Respiratory system: normal chest rise, clear, no audible wheezing Cardiovascular  system: regular rhythm, s1-s2 Gastrointestinal system: Nondistended, nontender, pos BS Central nervous system: No seizures, no tremors Extremities: No cyanosis, no joint deformities Skin: No rashes, no pallor Psychiatry: unable to assess given mentation  Data Reviewed: I have personally reviewed following labs and imaging studies  CBC: Recent Labs  Lab 05/31/21 0531 06/01/21 0358 06/02/21 0352 06/03/21 0346 06-10-2021 0352  WBC 7.4 7.2 12.2* 10.9* 11.5*  NEUTROABS 5.9 5.3 9.9* 8.7* 9.6*  HGB 10.8* 11.1* 12.3 12.2 11.5*  HCT 34.5* 33.8* 38.4 38.5 37.5  MCV 95.8 94.9 95.5 96.7 97.9  PLT 152 144* 193 182 300    Basic Metabolic Panel: Recent Labs  Lab 05/31/21 0531 06/01/21 0358 06/02/21 0352 06/03/21 0346 06/03/21 1328 06/10/2021 0352  NA 141 143 150* 157* 155* 150*  K 4.2 3.6 3.4* 3.3* 3.9 3.6  CL 106 107 109 118* 119* 115*  CO2 21* 23 25 25 23 22   GLUCOSE 140* 123* 140* 180* 175* 170*  BUN 51* 67* 91* 106* 112* 119*  CREATININE 1.90* 1.89* 2.12* 2.19* 2.28* 2.37*  CALCIUM  9.7 9.5 9.7 9.6 9.7 9.4  MG 2.1  --   --   --   --   --     GFR: Estimated Creatinine Clearance: 13.1 mL/min (A) (by C-G formula based on SCr of 2.37 mg/dL (H)). Liver Function Tests: Recent Labs  Lab 05/31/21 0531 06/01/21 0358 06/02/21 0352 06/03/21 0346 15-Jun-2021 0352  AST 73* 67* 64* 46* 31  ALT 56* 47* 48* 39 32  ALKPHOS 118 91 91 85 72  BILITOT 0.9 0.6 0.7 0.6 0.6  PROT 7.9 7.2 7.9 7.6 6.6  ALBUMIN 4.3 3.8 4.3 4.0 3.6    No results for input(s): LIPASE, AMYLASE in the last 168 hours. Recent Labs  Lab 06/01/21 1502  AMMONIA 17    Coagulation Profile: No results for input(s): INR, PROTIME in the last 168 hours. Cardiac Enzymes: No results for input(s): CKTOTAL, CKMB, CKMBINDEX, TROPONINI in the last 168 hours. BNP (last 3 results) No results for input(s): PROBNP in the last 8760 hours. HbA1C: No results for input(s): HGBA1C in the last 72 hours. CBG: Recent Labs  Lab  06/02/21 2019  GLUCAP 128*    Lipid Profile: No results for input(s): CHOL, HDL, LDLCALC, TRIG, CHOLHDL, LDLDIRECT in the last 72 hours. Thyroid Function Tests: No results for input(s): TSH, T4TOTAL, FREET4, T3FREE, THYROIDAB in the last 72 hours.  Anemia Panel: Recent Labs    06/01/21 1502  VITAMINB12 527  FOLATE 10.3    Sepsis Labs: Recent Labs  Lab 06/06/2021 2214 06/01/21 0358 06/02/21 0352  PROCALCITON 0.16 0.24 0.24     Recent Results (from the past 240 hour(s))  Resp Panel by RT-PCR (Flu A&B, Covid) Nasopharyngeal Swab     Status: Abnormal   Collection Time: 05/23/2021  1:47 PM   Specimen: Nasopharyngeal Swab; Nasopharyngeal(NP) swabs in vial transport medium  Result Value Ref Range Status   SARS Coronavirus 2 by RT PCR POSITIVE (A) NEGATIVE Final    Comment: (NOTE) SARS-CoV-2 target nucleic acids are DETECTED.  The SARS-CoV-2 RNA is generally detectable in upper respiratory specimens during the acute phase of infection. Positive results are indicative of the presence of the identified virus, but do not rule out bacterial infection or co-infection with other pathogens not detected by the test. Clinical correlation with patient history and other diagnostic information is necessary to determine patient infection status. The expected result is Negative.  Fact Sheet for Patients: EntrepreneurPulse.com.au  Fact Sheet for Healthcare Providers: IncredibleEmployment.be  This test is not yet approved or cleared by the [place] FDA and  has been authorized for detection and/or diagnosis of SARS-CoV-2 by FDA under an Emergency Use Authorization (EUA).  This EUA will remain in effect (meaning this test can be used) for the duration of  the COVID-19 declaration under Section 564(b)(1) of the A ct, 21 U.S.C. section 360bbb-3(b)(1), unless the authorization is terminated or revoked sooner.     Influenza A by PCR NEGATIVE NEGATIVE  Final   Influenza B by PCR NEGATIVE NEGATIVE Final    Comment: (NOTE) The Xpert Xpress SARS-CoV-2/FLU/RSV plus assay is intended as an aid in the diagnosis of influenza from Nasopharyngeal swab specimens and should not be used as a sole basis for treatment. Nasal washings and aspirates are unacceptable for Xpert Xpress SARS-CoV-2/FLU/RSV testing.  Fact Sheet for Patients: EntrepreneurPulse.com.au  Fact Sheet for Healthcare Providers: IncredibleEmployment.be  This test is not yet approved or cleared by the [place] FDA and has been authorized for detection and/or diagnosis of SARS-CoV-2 by FDA  under an Emergency Use Authorization (EUA). This EUA will remain in effect (meaning this test can be used) for the duration of the COVID-19 declaration under Section 564(b)(1) of the Act, 21 U.S.C. section 360bbb-3(b)(1), unless the authorization is terminated or revoked.  Performed at [facility], [address] [place], [place] [address]   MRSA Next Gen by PCR, Nasal     Status: None   Collection Time: 05/29/2021  8:09 PM   Specimen: Nasal Mucosa; Nasal Swab  Result Value Ref Range Status   MRSA by PCR Next Gen NOT DETECTED NOT DETECTED Final    Comment: (NOTE) The GeneXpert MRSA Assay (FDA approved for NASAL specimens only), is one component of a comprehensive MRSA colonization surveillance program. It is not intended to diagnose MRSA infection nor to guide or monitor treatment for MRSA infections. Test performance is not FDA approved in patients less than 86 years old. Performed at [facility], [place] [address] [place], [place] [address]   Culture, blood (routine x 2)     Status: None (Preliminary result)   Collection Time: 06/01/21  3:02 PM   Specimen: BLOOD RIGHT WRIST  Result Value Ref Range Status   Specimen Description   Final    BLOOD RIGHT WRIST Performed at [facility] [address], [place], [place] [address]    Special Requests   Final    BOTTLES DRAWN AEROBIC AND ANAEROBIC Blood Culture adequate volume Performed at [facility] [address] [place], [place] [address]    Culture   Final    NO GROWTH 3 DAYS Performed at [facility], [place] [address], [place], [place] [address]    Report Status PENDING  Incomplete  Culture, blood (routine x 2)     Status: None (Preliminary result)   Collection Time: 06/01/21  3:02 PM   Specimen: BLOOD  Result Value Ref Range Status   Specimen Description   Final    BLOOD BLOOD RIGHT HAND Performed at [facility] [address] [place], [place] [address]    Special Requests   Final    BOTTLES DRAWN AEROBIC AND ANAEROBIC Blood Culture results may not be optimal due to an inadequate volume of blood received in culture bottles Performed at [facility] [address] [place], [place] [address]    Culture   Final    NO GROWTH 3 DAYS Performed at [facility], [place] [address] [place], [place] [address]    Report Status PENDING  Incomplete      Radiology Studies: US RENAL  Result Date: 2021/06/28 CLINICAL DATA:  Acute renal failure EXAM: RENAL / URINARY TRACT ULTRASOUND COMPLETE COMPARISON:  None. FINDINGS: The patient was unable to cooperate with today's exam, causing limitations in exam sensitivity and specificity. Right Kidney: Renal measurements: 10.6 by 4.8 by 4.5 cm = volume: 118 mL. Numerous bilateral renal cysts. The largest measured 4.3 by 3.9 by 3.7 cm. Some have septations or potentially complex elements. Renal echogenicity appears slightly greater than that in the liver, for example on image 16 of series 1 -1, raising the possibility of chronic medical renal disease. No hydronephrosis. Left Kidney: Renal measurements: 8.1 by 4.5 by 4.3 cm = volume: 81 mL. Numerous bilateral renal cysts. Suspected mildly echogenic left kidney. No  hydronephrosis noted. Bladder: Appears normal for degree of bladder distention. Other: None. IMPRESSION: 1. Reduced exam sensitivity and specificity due to patient inability to cooperate  with today's exam. 2. Numerous bilateral renal cysts. Possibly mild complex elements in some of these including septations, but no solid mass was identified. The cysts could be further assessed with renal protocol MRI with and without contrast following the patient's acute episode, clinically warranted. 3. Mildly echogenic kidneys suggesting chronic medical renal disease. No hydronephrosis observed. Electronically Signed   By: Van Clines M.D.   On: 06/24/21 09:33    Scheduled Meds:   HYDROmorphone (DILAUDID) injection  0.5 mg Intravenous NOW   mouth rinse  15 mL Mouth Rinse BID   Continuous Infusions:  sodium chloride 10 mL/hr (06/02/21 1037)   HYDROmorphone       LOS: 5 days   Marylu Lund, MD [facility] Pager On Amion  If 7PM-7AM, please contact night-coverage June 24, 2021, 1:47 PM

## 2021-06-09 NOTE — Plan of Care (Signed)
  Problem: Coping: Goal: Level of anxiety will decrease Outcome: Progressing   Problem: Pain Managment: Goal: General experience of comfort will improve Outcome: Progressing   

## 2021-06-09 NOTE — Progress Notes (Signed)
Attempted to go into patient's room and hang dilaudid drip per comfort orders. Pt's heart rate rapidly declining and patient expired at 1418. Patient was DNR. Dilaudid drip was never administered.

## 2021-06-09 NOTE — TOC Progression Note (Addendum)
Transition of Care [facility]) - Progression Note    Patient Details  Name: Rhonda Sharp MRN: [id number] Date of Birth: [birth year]  Transition of [facility]) CM/SW Contact  Purcell Mouton, RN Phone Number: 07/01/21, 12:46 PM  Clinical Narrative:     TOC will continue to follow pt for discharge disposition. COVID positive 05/18/2021.  Expected Discharge Plan: [place] Barriers to Discharge: Continued Medical Work up  Expected Discharge Plan and Services Expected Discharge Plan: [place] In-house Referral: Clinical Social Work     Living arrangements for the past 2 months: Single Family Home                                       Social Determinants of Health (SDOH) Interventions    Readmission Risk Interventions Readmission Risk Prevention Plan 05/31/2021  Transportation Screening Complete  PCP or Specialist Appt within 5-7 Days Complete  Home Care Screening Complete  Some recent data might be hidden

## 2021-06-09 DEATH — deceased
